# Patient Record
Sex: Male | Born: 1950
Health system: Southern US, Community
[De-identification: ages and names within clinical notes are randomized; demographics above are authoritative.]

## PROBLEM LIST (undated history)

## (undated) DIAGNOSIS — J302 Other seasonal allergic rhinitis: Secondary | ICD-10-CM

## (undated) DIAGNOSIS — R945 Abnormal results of liver function studies: Secondary | ICD-10-CM

## (undated) DIAGNOSIS — E7800 Pure hypercholesterolemia, unspecified: Secondary | ICD-10-CM

## (undated) DIAGNOSIS — K589 Irritable bowel syndrome without diarrhea: Secondary | ICD-10-CM

## (undated) DIAGNOSIS — C61 Malignant neoplasm of prostate: Secondary | ICD-10-CM

## (undated) DIAGNOSIS — I10 Essential (primary) hypertension: Secondary | ICD-10-CM

## (undated) DIAGNOSIS — G540 Brachial plexus disorders: Secondary | ICD-10-CM

## (undated) HISTORY — DX: Abnormal results of liver function studies: R94.5

## (undated) HISTORY — PX: HAND SURGERY: SHX662

## (undated) HISTORY — DX: Essential (primary) hypertension: I10

## (undated) HISTORY — PX: PROSTATECTOMY: SHX69

## (undated) HISTORY — DX: Brachial plexus disorders: G54.0

## (undated) HISTORY — DX: Malignant neoplasm of prostate: C61

## (undated) HISTORY — PX: KNEE SURGERY: SHX244

## (undated) HISTORY — DX: Irritable bowel syndrome, unspecified: K58.9

---

## 1997-06-27 ENCOUNTER — Encounter: Admission: RE | Admit: 1997-06-27 | Discharge: 1997-06-27 | Payer: Self-pay | Admitting: Family Medicine

## 1998-06-16 ENCOUNTER — Encounter: Admission: RE | Admit: 1998-06-16 | Discharge: 1998-06-16 | Payer: Self-pay | Admitting: Sports Medicine

## 1998-06-26 ENCOUNTER — Encounter: Payer: Self-pay | Admitting: Emergency Medicine

## 1998-06-27 ENCOUNTER — Inpatient Hospital Stay (HOSPITAL_COMMUNITY): Admission: EM | Admit: 1998-06-27 | Discharge: 1998-06-28 | Payer: Self-pay | Admitting: Emergency Medicine

## 1999-06-11 ENCOUNTER — Encounter: Admission: RE | Admit: 1999-06-11 | Discharge: 1999-06-11 | Payer: Self-pay | Admitting: Family Medicine

## 1999-07-19 ENCOUNTER — Encounter: Admission: RE | Admit: 1999-07-19 | Discharge: 1999-07-19 | Payer: Self-pay | Admitting: Family Medicine

## 2000-04-03 ENCOUNTER — Encounter: Payer: Self-pay | Admitting: Family Medicine

## 2000-04-21 ENCOUNTER — Encounter: Admission: RE | Admit: 2000-04-21 | Discharge: 2000-04-21 | Payer: Self-pay | Admitting: Family Medicine

## 2000-08-12 ENCOUNTER — Encounter: Admission: RE | Admit: 2000-08-12 | Discharge: 2000-08-12 | Payer: Self-pay | Admitting: Sports Medicine

## 2001-06-19 ENCOUNTER — Encounter: Admission: RE | Admit: 2001-06-19 | Discharge: 2001-06-19 | Payer: Self-pay | Admitting: Family Medicine

## 2001-07-20 ENCOUNTER — Encounter: Admission: RE | Admit: 2001-07-20 | Discharge: 2001-07-20 | Payer: Self-pay | Admitting: Family Medicine

## 2001-07-21 DIAGNOSIS — R7989 Other specified abnormal findings of blood chemistry: Secondary | ICD-10-CM

## 2001-07-21 HISTORY — DX: Other specified abnormal findings of blood chemistry: R79.89

## 2002-02-04 ENCOUNTER — Encounter: Admission: RE | Admit: 2002-02-04 | Discharge: 2002-02-04 | Payer: Self-pay | Admitting: Sports Medicine

## 2002-04-28 ENCOUNTER — Encounter: Admission: RE | Admit: 2002-04-28 | Discharge: 2002-04-28 | Payer: Self-pay | Admitting: Sports Medicine

## 2002-04-28 ENCOUNTER — Encounter: Payer: Self-pay | Admitting: Sports Medicine

## 2002-05-05 ENCOUNTER — Encounter: Admission: RE | Admit: 2002-05-05 | Discharge: 2002-05-05 | Payer: Self-pay | Admitting: Family Medicine

## 2002-05-11 ENCOUNTER — Encounter: Admission: RE | Admit: 2002-05-11 | Discharge: 2002-05-11 | Payer: Self-pay | Admitting: Family Medicine

## 2002-05-18 ENCOUNTER — Encounter: Admission: RE | Admit: 2002-05-18 | Discharge: 2002-06-21 | Payer: Self-pay | Admitting: Sports Medicine

## 2002-06-15 ENCOUNTER — Encounter: Admission: RE | Admit: 2002-06-15 | Discharge: 2002-06-15 | Payer: Self-pay | Admitting: Family Medicine

## 2002-08-25 ENCOUNTER — Encounter: Payer: Self-pay | Admitting: Sports Medicine

## 2002-08-25 ENCOUNTER — Encounter: Admission: RE | Admit: 2002-08-25 | Discharge: 2002-08-25 | Payer: Self-pay | Admitting: Sports Medicine

## 2002-09-08 ENCOUNTER — Encounter: Admission: RE | Admit: 2002-09-08 | Discharge: 2002-09-08 | Payer: Self-pay | Admitting: Sports Medicine

## 2002-11-09 ENCOUNTER — Encounter: Admission: RE | Admit: 2002-11-09 | Discharge: 2002-11-09 | Payer: Self-pay | Admitting: Sports Medicine

## 2002-12-15 ENCOUNTER — Encounter: Admission: RE | Admit: 2002-12-15 | Discharge: 2002-12-15 | Payer: Self-pay | Admitting: Family Medicine

## 2003-02-08 ENCOUNTER — Encounter: Admission: RE | Admit: 2003-02-08 | Discharge: 2003-02-08 | Payer: Self-pay | Admitting: Family Medicine

## 2003-03-02 ENCOUNTER — Encounter: Admission: RE | Admit: 2003-03-02 | Discharge: 2003-03-02 | Payer: Self-pay | Admitting: Family Medicine

## 2003-06-27 ENCOUNTER — Encounter: Admission: RE | Admit: 2003-06-27 | Discharge: 2003-06-27 | Payer: Self-pay | Admitting: Family Medicine

## 2003-07-04 ENCOUNTER — Emergency Department (HOSPITAL_COMMUNITY): Admission: EM | Admit: 2003-07-04 | Discharge: 2003-07-04 | Payer: Self-pay | Admitting: Family Medicine

## 2003-07-21 ENCOUNTER — Encounter: Admission: RE | Admit: 2003-07-21 | Discharge: 2003-07-21 | Payer: Self-pay | Admitting: Family Medicine

## 2003-09-20 ENCOUNTER — Encounter: Admission: RE | Admit: 2003-09-20 | Discharge: 2003-09-20 | Payer: Self-pay | Admitting: Family Medicine

## 2003-12-08 ENCOUNTER — Ambulatory Visit: Payer: Self-pay | Admitting: Sports Medicine

## 2003-12-22 ENCOUNTER — Ambulatory Visit: Payer: Self-pay | Admitting: Sports Medicine

## 2004-02-26 ENCOUNTER — Emergency Department (HOSPITAL_COMMUNITY): Admission: EM | Admit: 2004-02-26 | Discharge: 2004-02-26 | Payer: Self-pay | Admitting: Emergency Medicine

## 2004-03-22 ENCOUNTER — Ambulatory Visit: Payer: Self-pay | Admitting: Family Medicine

## 2004-06-06 ENCOUNTER — Emergency Department (HOSPITAL_COMMUNITY): Admission: EM | Admit: 2004-06-06 | Discharge: 2004-06-06 | Payer: Self-pay | Admitting: Emergency Medicine

## 2004-06-07 ENCOUNTER — Ambulatory Visit: Payer: Self-pay | Admitting: Family Medicine

## 2004-06-30 ENCOUNTER — Emergency Department (HOSPITAL_COMMUNITY): Admission: EM | Admit: 2004-06-30 | Discharge: 2004-06-30 | Payer: Self-pay | Admitting: Emergency Medicine

## 2004-07-09 ENCOUNTER — Ambulatory Visit: Payer: Self-pay | Admitting: Family Medicine

## 2004-07-12 ENCOUNTER — Ambulatory Visit: Payer: Self-pay | Admitting: Sports Medicine

## 2004-11-16 ENCOUNTER — Ambulatory Visit: Payer: Self-pay | Admitting: Sports Medicine

## 2004-11-22 ENCOUNTER — Encounter: Admission: RE | Admit: 2004-11-22 | Discharge: 2004-11-22 | Payer: Self-pay | Admitting: Sports Medicine

## 2005-06-13 ENCOUNTER — Ambulatory Visit: Payer: Self-pay | Admitting: Sports Medicine

## 2005-09-08 ENCOUNTER — Emergency Department (HOSPITAL_COMMUNITY): Admission: EM | Admit: 2005-09-08 | Discharge: 2005-09-08 | Payer: Self-pay | Admitting: Emergency Medicine

## 2005-09-11 ENCOUNTER — Encounter (HOSPITAL_COMMUNITY): Admission: RE | Admit: 2005-09-11 | Discharge: 2005-12-10 | Payer: Self-pay | Admitting: Emergency Medicine

## 2005-09-12 ENCOUNTER — Ambulatory Visit: Payer: Self-pay | Admitting: Sports Medicine

## 2005-09-13 ENCOUNTER — Ambulatory Visit: Payer: Self-pay | Admitting: Sports Medicine

## 2005-11-18 ENCOUNTER — Ambulatory Visit: Payer: Self-pay | Admitting: Sports Medicine

## 2006-03-20 DIAGNOSIS — G44209 Tension-type headache, unspecified, not intractable: Secondary | ICD-10-CM

## 2006-03-20 DIAGNOSIS — J45990 Exercise induced bronchospasm: Secondary | ICD-10-CM | POA: Insufficient documentation

## 2006-03-20 DIAGNOSIS — N411 Chronic prostatitis: Secondary | ICD-10-CM | POA: Insufficient documentation

## 2006-03-20 DIAGNOSIS — E785 Hyperlipidemia, unspecified: Secondary | ICD-10-CM

## 2006-03-25 ENCOUNTER — Observation Stay (HOSPITAL_COMMUNITY): Admission: EM | Admit: 2006-03-25 | Discharge: 2006-03-26 | Payer: Self-pay | Admitting: Family Medicine

## 2006-03-25 ENCOUNTER — Ambulatory Visit: Payer: Self-pay | Admitting: Internal Medicine

## 2006-03-28 ENCOUNTER — Telehealth: Payer: Self-pay | Admitting: *Deleted

## 2006-04-04 ENCOUNTER — Telehealth: Payer: Self-pay | Admitting: Sports Medicine

## 2006-04-04 ENCOUNTER — Encounter: Payer: Self-pay | Admitting: Sports Medicine

## 2006-04-07 ENCOUNTER — Encounter: Payer: Self-pay | Admitting: Sports Medicine

## 2006-05-22 ENCOUNTER — Ambulatory Visit: Payer: Self-pay | Admitting: Sports Medicine

## 2006-05-22 DIAGNOSIS — M25469 Effusion, unspecified knee: Secondary | ICD-10-CM | POA: Insufficient documentation

## 2006-05-22 DIAGNOSIS — M25569 Pain in unspecified knee: Secondary | ICD-10-CM | POA: Insufficient documentation

## 2006-05-23 ENCOUNTER — Telehealth: Payer: Self-pay | Admitting: Sports Medicine

## 2006-05-27 ENCOUNTER — Ambulatory Visit: Payer: Self-pay | Admitting: Sports Medicine

## 2006-05-27 DIAGNOSIS — IMO0002 Reserved for concepts with insufficient information to code with codable children: Secondary | ICD-10-CM | POA: Insufficient documentation

## 2006-05-28 ENCOUNTER — Encounter: Payer: Self-pay | Admitting: Sports Medicine

## 2006-07-01 ENCOUNTER — Ambulatory Visit (HOSPITAL_BASED_OUTPATIENT_CLINIC_OR_DEPARTMENT_OTHER): Admission: RE | Admit: 2006-07-01 | Discharge: 2006-07-01 | Payer: Self-pay | Admitting: Orthopedic Surgery

## 2006-07-04 ENCOUNTER — Encounter: Payer: Self-pay | Admitting: Sports Medicine

## 2006-10-09 ENCOUNTER — Ambulatory Visit: Payer: Self-pay | Admitting: Sports Medicine

## 2006-10-10 ENCOUNTER — Telehealth: Payer: Self-pay | Admitting: Sports Medicine

## 2006-11-12 ENCOUNTER — Ambulatory Visit: Payer: Self-pay | Admitting: Family Medicine

## 2007-01-20 ENCOUNTER — Encounter: Payer: Self-pay | Admitting: Sports Medicine

## 2007-03-11 ENCOUNTER — Telehealth: Payer: Self-pay | Admitting: *Deleted

## 2007-03-11 ENCOUNTER — Encounter (INDEPENDENT_AMBULATORY_CARE_PROVIDER_SITE_OTHER): Payer: Self-pay | Admitting: *Deleted

## 2007-03-11 ENCOUNTER — Encounter: Admission: RE | Admit: 2007-03-11 | Discharge: 2007-03-11 | Payer: Self-pay | Admitting: *Deleted

## 2007-03-11 ENCOUNTER — Ambulatory Visit: Payer: Self-pay | Admitting: Family Medicine

## 2007-03-25 ENCOUNTER — Ambulatory Visit: Payer: Self-pay | Admitting: Sports Medicine

## 2007-03-25 DIAGNOSIS — S336XXA Sprain of sacroiliac joint, initial encounter: Secondary | ICD-10-CM | POA: Insufficient documentation

## 2007-03-29 ENCOUNTER — Encounter: Admission: RE | Admit: 2007-03-29 | Discharge: 2007-03-29 | Payer: Self-pay | Admitting: Family Medicine

## 2007-04-01 ENCOUNTER — Encounter (INDEPENDENT_AMBULATORY_CARE_PROVIDER_SITE_OTHER): Payer: Self-pay | Admitting: *Deleted

## 2007-04-01 ENCOUNTER — Telehealth (INDEPENDENT_AMBULATORY_CARE_PROVIDER_SITE_OTHER): Payer: Self-pay | Admitting: *Deleted

## 2007-04-02 ENCOUNTER — Telehealth (INDEPENDENT_AMBULATORY_CARE_PROVIDER_SITE_OTHER): Payer: Self-pay | Admitting: *Deleted

## 2007-04-09 ENCOUNTER — Encounter: Payer: Self-pay | Admitting: Sports Medicine

## 2007-04-23 ENCOUNTER — Telehealth: Payer: Self-pay | Admitting: *Deleted

## 2007-04-23 ENCOUNTER — Ambulatory Visit: Payer: Self-pay | Admitting: Sports Medicine

## 2007-04-23 DIAGNOSIS — E785 Hyperlipidemia, unspecified: Secondary | ICD-10-CM | POA: Insufficient documentation

## 2007-04-23 DIAGNOSIS — M4310 Spondylolisthesis, site unspecified: Secondary | ICD-10-CM

## 2007-04-27 ENCOUNTER — Encounter: Admission: RE | Admit: 2007-04-27 | Discharge: 2007-04-27 | Payer: Self-pay | Admitting: Sports Medicine

## 2007-04-27 LAB — CONVERTED CEMR LAB
ALT: 29 units/L (ref 0–53)
Albumin: 4.5 g/dL (ref 3.5–5.2)
CO2: 23 meq/L (ref 19–32)
Calcium: 9.5 mg/dL (ref 8.4–10.5)
Cholesterol: 173 mg/dL (ref 0–200)
Creatinine, Ser: 0.89 mg/dL (ref 0.40–1.50)
Glucose, Bld: 91 mg/dL (ref 70–99)
HCT: 43 % (ref 39.0–52.0)
Hemoglobin: 14.8 g/dL (ref 13.0–17.0)
LDL Cholesterol: 106 mg/dL — ABNORMAL HIGH (ref 0–99)
MCV: 93.5 fL (ref 78.0–100.0)
PSA: 1.86 ng/mL (ref 0.10–4.00)
Platelets: 247 10*3/uL (ref 150–400)
Total CHOL/HDL Ratio: 3.5
Total Protein: 7 g/dL (ref 6.0–8.3)
VLDL: 18 mg/dL (ref 0–40)

## 2007-04-30 ENCOUNTER — Ambulatory Visit: Payer: Self-pay | Admitting: Sports Medicine

## 2007-04-30 DIAGNOSIS — L570 Actinic keratosis: Secondary | ICD-10-CM | POA: Insufficient documentation

## 2007-06-29 ENCOUNTER — Ambulatory Visit (HOSPITAL_BASED_OUTPATIENT_CLINIC_OR_DEPARTMENT_OTHER): Admission: RE | Admit: 2007-06-29 | Discharge: 2007-06-29 | Payer: Self-pay | Admitting: Orthopedic Surgery

## 2007-09-01 ENCOUNTER — Ambulatory Visit: Payer: Self-pay | Admitting: Sports Medicine

## 2007-09-01 DIAGNOSIS — L84 Corns and callosities: Secondary | ICD-10-CM

## 2007-10-14 ENCOUNTER — Ambulatory Visit: Payer: Self-pay | Admitting: Family Medicine

## 2007-12-29 ENCOUNTER — Ambulatory Visit: Payer: Self-pay | Admitting: Family Medicine

## 2008-01-04 ENCOUNTER — Encounter: Payer: Self-pay | Admitting: Family Medicine

## 2008-01-28 ENCOUNTER — Ambulatory Visit: Payer: Self-pay | Admitting: Family Medicine

## 2008-01-28 ENCOUNTER — Telehealth: Payer: Self-pay | Admitting: *Deleted

## 2008-05-31 ENCOUNTER — Ambulatory Visit: Payer: Self-pay | Admitting: Family Medicine

## 2008-05-31 DIAGNOSIS — R197 Diarrhea, unspecified: Secondary | ICD-10-CM

## 2008-05-31 DIAGNOSIS — R079 Chest pain, unspecified: Secondary | ICD-10-CM

## 2008-06-01 ENCOUNTER — Encounter: Payer: Self-pay | Admitting: Family Medicine

## 2008-06-01 LAB — CONVERTED CEMR LAB
LDL Cholesterol: 178 mg/dL — ABNORMAL HIGH (ref 0–99)
VLDL: 15 mg/dL (ref 0–40)

## 2008-06-06 ENCOUNTER — Telehealth: Payer: Self-pay | Admitting: Family Medicine

## 2008-06-06 ENCOUNTER — Encounter: Payer: Self-pay | Admitting: Family Medicine

## 2008-06-21 ENCOUNTER — Ambulatory Visit: Payer: Self-pay | Admitting: Sports Medicine

## 2008-06-21 DIAGNOSIS — M25559 Pain in unspecified hip: Secondary | ICD-10-CM

## 2008-11-10 ENCOUNTER — Encounter: Payer: Self-pay | Admitting: Urology

## 2008-11-10 ENCOUNTER — Encounter: Payer: Self-pay | Admitting: Family Medicine

## 2008-11-10 ENCOUNTER — Ambulatory Visit (HOSPITAL_BASED_OUTPATIENT_CLINIC_OR_DEPARTMENT_OTHER): Admission: RE | Admit: 2008-11-10 | Discharge: 2008-11-10 | Payer: Self-pay | Admitting: Urology

## 2008-11-21 ENCOUNTER — Telehealth: Payer: Self-pay | Admitting: *Deleted

## 2008-11-23 ENCOUNTER — Ambulatory Visit: Payer: Self-pay | Admitting: Family Medicine

## 2008-11-23 ENCOUNTER — Telehealth: Payer: Self-pay | Admitting: Family Medicine

## 2008-11-23 ENCOUNTER — Ambulatory Visit: Admission: RE | Admit: 2008-11-23 | Discharge: 2008-12-26 | Payer: Self-pay | Admitting: Radiation Oncology

## 2008-11-23 DIAGNOSIS — C61 Malignant neoplasm of prostate: Secondary | ICD-10-CM

## 2008-11-24 ENCOUNTER — Encounter: Payer: Self-pay | Admitting: Family Medicine

## 2008-11-28 ENCOUNTER — Encounter: Payer: Self-pay | Admitting: Family Medicine

## 2008-11-28 ENCOUNTER — Telehealth: Payer: Self-pay | Admitting: *Deleted

## 2008-11-28 ENCOUNTER — Ambulatory Visit: Payer: Self-pay | Admitting: Family Medicine

## 2008-11-28 ENCOUNTER — Ambulatory Visit (HOSPITAL_COMMUNITY): Admission: RE | Admit: 2008-11-28 | Discharge: 2008-11-28 | Payer: Self-pay | Admitting: Family Medicine

## 2008-11-28 LAB — CONVERTED CEMR LAB
BUN: 15 mg/dL (ref 6–23)
Calcium: 9.4 mg/dL (ref 8.4–10.5)
Chloride: 105 meq/L (ref 96–112)
Creatinine, Ser: 1.11 mg/dL (ref 0.40–1.50)
Potassium: 4.1 meq/L (ref 3.5–5.3)

## 2008-11-29 ENCOUNTER — Ambulatory Visit: Payer: Self-pay | Admitting: Family Medicine

## 2008-11-30 ENCOUNTER — Ambulatory Visit (HOSPITAL_COMMUNITY): Admission: RE | Admit: 2008-11-30 | Discharge: 2008-11-30 | Payer: Self-pay | Admitting: Family Medicine

## 2008-12-12 ENCOUNTER — Ambulatory Visit: Payer: Self-pay | Admitting: Family Medicine

## 2009-01-02 ENCOUNTER — Inpatient Hospital Stay (HOSPITAL_COMMUNITY): Admission: RE | Admit: 2009-01-02 | Discharge: 2009-01-03 | Payer: Self-pay | Admitting: Urology

## 2009-01-02 ENCOUNTER — Encounter (INDEPENDENT_AMBULATORY_CARE_PROVIDER_SITE_OTHER): Payer: Self-pay | Admitting: Urology

## 2009-01-09 ENCOUNTER — Telehealth: Payer: Self-pay | Admitting: Family Medicine

## 2009-01-10 ENCOUNTER — Encounter: Payer: Self-pay | Admitting: Sports Medicine

## 2009-02-22 ENCOUNTER — Encounter: Payer: Self-pay | Admitting: Sports Medicine

## 2009-03-29 ENCOUNTER — Telehealth: Payer: Self-pay | Admitting: Family Medicine

## 2009-03-29 ENCOUNTER — Ambulatory Visit: Payer: Self-pay | Admitting: Family Medicine

## 2009-03-29 ENCOUNTER — Encounter: Payer: Self-pay | Admitting: Family Medicine

## 2009-03-29 DIAGNOSIS — R300 Dysuria: Secondary | ICD-10-CM | POA: Insufficient documentation

## 2009-03-29 LAB — CONVERTED CEMR LAB
Bilirubin Urine: NEGATIVE
Ketones, urine, test strip: NEGATIVE
pH: 6

## 2009-04-14 ENCOUNTER — Ambulatory Visit: Payer: Self-pay | Admitting: Family Medicine

## 2009-04-14 LAB — CONVERTED CEMR LAB
Glucose, Urine, Semiquant: NEGATIVE
Ketones, urine, test strip: NEGATIVE
Specific Gravity, Urine: 1.015

## 2009-04-18 ENCOUNTER — Telehealth (INDEPENDENT_AMBULATORY_CARE_PROVIDER_SITE_OTHER): Payer: Self-pay | Admitting: *Deleted

## 2009-04-19 ENCOUNTER — Ambulatory Visit: Payer: Self-pay | Admitting: Family Medicine

## 2009-04-19 LAB — CONVERTED CEMR LAB
Bilirubin Urine: NEGATIVE
Glucose, Urine, Semiquant: NEGATIVE
Protein, U semiquant: NEGATIVE
Urobilinogen, UA: 0.2
WBC Urine, dipstick: NEGATIVE
pH: 5.5

## 2009-04-20 ENCOUNTER — Encounter: Payer: Self-pay | Admitting: Family Medicine

## 2009-05-15 ENCOUNTER — Ambulatory Visit: Payer: Self-pay | Admitting: Family Medicine

## 2009-05-15 DIAGNOSIS — H9209 Otalgia, unspecified ear: Secondary | ICD-10-CM | POA: Insufficient documentation

## 2009-07-05 ENCOUNTER — Encounter: Payer: Self-pay | Admitting: Family Medicine

## 2009-08-07 ENCOUNTER — Ambulatory Visit: Payer: Self-pay | Admitting: Family Medicine

## 2009-08-07 ENCOUNTER — Telehealth: Payer: Self-pay | Admitting: Family Medicine

## 2009-09-22 ENCOUNTER — Ambulatory Visit: Payer: Self-pay | Admitting: Family Medicine

## 2009-09-22 DIAGNOSIS — L2089 Other atopic dermatitis: Secondary | ICD-10-CM

## 2009-09-22 DIAGNOSIS — B356 Tinea cruris: Secondary | ICD-10-CM

## 2009-09-22 LAB — CONVERTED CEMR LAB
ALT: 28 units/L (ref 0–53)
AST: 63 units/L — ABNORMAL HIGH (ref 0–37)
Albumin: 4.2 g/dL (ref 3.5–5.2)
CO2: 28 meq/L (ref 19–32)
Calcium: 9.7 mg/dL (ref 8.4–10.5)
Chloride: 102 meq/L (ref 96–112)
Total Protein: 6.8 g/dL (ref 6.0–8.3)

## 2009-09-27 ENCOUNTER — Telehealth: Payer: Self-pay | Admitting: Family Medicine

## 2009-11-02 ENCOUNTER — Ambulatory Visit: Payer: Self-pay | Admitting: Family Medicine

## 2009-12-22 ENCOUNTER — Encounter (INDEPENDENT_AMBULATORY_CARE_PROVIDER_SITE_OTHER): Payer: Self-pay | Admitting: *Deleted

## 2009-12-22 ENCOUNTER — Ambulatory Visit: Payer: Self-pay | Admitting: Family Medicine

## 2009-12-25 ENCOUNTER — Telehealth: Payer: Self-pay | Admitting: Family Medicine

## 2009-12-26 LAB — CONVERTED CEMR LAB
BUN: 18 mg/dL (ref 6–23)
Calcium: 9.2 mg/dL (ref 8.4–10.5)
Direct LDL: 123 mg/dL — ABNORMAL HIGH
Glucose, Bld: 91 mg/dL (ref 70–99)
Sodium: 139 meq/L (ref 135–145)
Total Protein: 7 g/dL (ref 6.0–8.3)

## 2010-01-05 ENCOUNTER — Encounter: Payer: Self-pay | Admitting: Sports Medicine

## 2010-01-15 ENCOUNTER — Encounter: Payer: Self-pay | Admitting: Family Medicine

## 2010-01-15 ENCOUNTER — Observation Stay (HOSPITAL_COMMUNITY)
Admission: EM | Admit: 2010-01-15 | Discharge: 2010-01-15 | Payer: Self-pay | Source: Home / Self Care | Attending: Family Medicine | Admitting: Family Medicine

## 2010-01-24 ENCOUNTER — Ambulatory Visit
Admission: RE | Admit: 2010-01-24 | Discharge: 2010-01-24 | Payer: Self-pay | Source: Home / Self Care | Attending: Family Medicine | Admitting: Family Medicine

## 2010-01-24 LAB — CONVERTED CEMR LAB: H Pylori IgG: NEGATIVE

## 2010-02-15 ENCOUNTER — Ambulatory Visit
Admission: RE | Admit: 2010-02-15 | Discharge: 2010-02-15 | Payer: Self-pay | Source: Home / Self Care | Attending: Family Medicine | Admitting: Family Medicine

## 2010-02-15 LAB — CONVERTED CEMR LAB
Bilirubin Urine: NEGATIVE
Glucose, Urine, Semiquant: NEGATIVE
Specific Gravity, Urine: 1.025
Urobilinogen, UA: 0.2
pH: 5.5

## 2010-02-20 NOTE — Progress Notes (Signed)
Summary: Rx Prob  Phone Note Call from Patient Call back at Home Phone [phone]   Caller: Patient Summary of Call: Pt says that he got a child's dosage of the z-pac like a powder to go in liquid.  Was this correct or was the wrong thing filled? Initial call taken by: Clydell Hakim,  August 07, 2009 1:46 PM  Follow-up for Phone Call        mistake in electronic prescribing, called and corrected Follow-up by: Luretha Murphy NP,  August 07, 2009 4:25 PM

## 2010-02-20 NOTE — Progress Notes (Signed)
  Phone Note Outgoing Call   Call placed by: Paula Compton MD,  September 27, 2009 8:36 AM Call placed to: Patient Action Taken: Phone Call Completed Summary of Call: Called and discussed lab results. To resume statin in light of LDL 169.  Continue with dietary and activity as he has been doing.  Skin on face and neck is better; groin tinea cruris is taking a little longer.  Discussed very minor elevation in single transaminase.  Will recheck iwth future labs.  Initial call taken by: Paula Compton MD,  September 27, 2009 8:37 AM

## 2010-02-20 NOTE — Progress Notes (Signed)
Summary: triage  Phone Note Call from Patient Call back at [phone]   Caller: Patient Summary of Call: Thinks he has a urinary tract infection and wondering if he can be seen today. Initial call taken by: Clydell Hakim,  March 29, 2009 9:59 AM  Follow-up for Phone Call        frequent painful urination. blood in underwear. started last night. states he had a prostatectomy for cancer recently but is not due back at that md until summer.. work in at 1:30. aware of wait Follow-up by: Golden Circle RN,  March 29, 2009 10:03 AM

## 2010-02-20 NOTE — Assessment & Plan Note (Signed)
Summary: flu shot,df  Nurse Visit   Vital Signs:  Patient profile:   60 year old male Temp:     98.4 degrees F  Vitals Entered By: Theresia Lo RN (November 02, 2009 4:12 PM)  Allergies: 1)  ! * Cialis  Immunizations Administered:  Influenza Vaccine # 1:    Vaccine Type: Fluvax 3+    Site: right deltoid    Mfr: GlaxoSmithKline    Dose: 0.5 ml    Route: IM    Given by: Theresia Lo RN    Exp. Date: 07/18/2010    Lot #: [id number]    VIS given: 08/15/09 version given November 02, 2009.  Flu Vaccine Consent Questions:    Do you have a history of severe allergic reactions to this vaccine? no    Any prior history of allergic reactions to egg and/or gelatin? no    Do you have a sensitivity to the preservative Thimersol? no    Do you have a past history of Guillan-Barre Syndrome? no    Do you currently have an acute febrile illness? no    Have you ever had a severe reaction to latex? no    Vaccine information given and explained to patient? yes  Orders Added: 1)  Flu Vaccine 55yrs + [90658] 2)  Admin 1st Vaccine [37902]

## 2010-02-20 NOTE — Assessment & Plan Note (Signed)
Summary: uti per pt/see notes/[place]   Vital Signs:  Patient profile:   60 year old male Height:      70.5 inches Weight:      163 pounds BMI:     23.14 Temp:     98.2 degrees F oral Pulse rate:   82 / minute BP sitting:   154 / 84  (left arm) Cuff size:   regular  Vitals Entered By: Tessie Fass CMA (March 29, 2009 1:42 PM) CC: dysuria, hematuria, and polyuria Is Patient Diabetic? No Pain Assessment Patient in pain? yes     Location: abdomen Intensity: 5   Primary Care Provider:  Paula Compton MD  CC:  dysuria, hematuria, and and polyuria.  History of Present Illness: 60 yo with history of robotic prostatectomy in December for prostate cancer here for 1 day history of urinary frequency, sensation of incomplete voiding, hematuria and pelvic fullness.  Denies fever, pain, nausea/vomting.    He is not currently undergoing any treatment from urology except for Erectile dysfunction.  Allergies: No Known Drug Allergies PMH-FH-SH reviewed-no changes except otherwise noted  Review of Systems      See HPI General:  Denies chills, fever, and sweats. GU:  Complains of dysuria, erectile dysfunction, hematuria, and urinary frequency.  Physical Exam  General:  well appearing, no apparent distress Abdomen:  no abd pain, no flank pain.  minimal discomfort on palpation of lower pelvis with no bladder fullness.   Impression & Recommendations:  Problem # 1:  DYSURIA (ICD-788.1) First time urinary tract infection.  Prostate removed with hx of prostate cancer so no concern for prostatitis. Given fairly recent instrumentation, will prescribe macrobid for 10 days to cover enterococcuc and E. Coli.  Will culture urine and follow-up with sensetivities.  Precepted with Dr. McDiarmid.  His updated medication list for this problem includes:    Macrobid 100 Mg Caps (Nitrofurantoin monohyd macro) .[name]... Take one tablet twice a day for 10 days.  Orders: Urinalysis-[facility] (00000) Urine  Culture-[facility] ([address]454) [facility]- Est Level  3 (09811)  Complete Medication List: 1)  Zocor 40 Mg Tabs (Simvastatin) .[name].. 1 by mouth qhs 2)  Singulair 10 Mg Tabs (Montelukast sodium) .[name].. 1 by mouth qhs 3)  Albuterol 90 Mcg/act Aers (Albuterol) .... Use 2 puffs q 6h prn 4)  Macrobid 100 Mg Caps (Nitrofurantoin monohyd macro) .... Take one tablet twice a day for 10 days.  Patient Instructions: 1)  you have a urinary tract infection. 2)  I have called in antibiotic- macrobid for you. 3)  Will sen doff your urine for culture- will call you to change antibiotics if we get new information. 4)  Take with food or milk 5)  May cause yellow/brown urine. 6)  Follow-up if no improvement in symptoms. Prescriptions: MACROBID 100 MG CAPS (NITROFURANTOIN MONOHYD MACRO) take one tablet twice a day for 10 days.  #20 x 0   Entered and Authorized by:   Delbert Harness MD   Signed by:   Delbert Harness MD on 03/29/2009   Method used:   Electronically to        [facility] [phone][facility] (retail)       [address]       [place], [place]  [address]       Ph: [phone] or [phone]       Fax: [phone]   RxID:   [id number]   Laboratory Results   Urine Tests  Date/Time Received: March 29, 2009 2:13 PM  Date/Time Reported: March 29, 2009 2:32 PM   Routine Urinalysis   Color: yellow Appearance: Clear Glucose: negative   (Normal Range: Negative) Bilirubin: negative   (Normal Range: Negative) Ketone: negative   (Normal Range: Negative) Spec. Gravity: <1.005   (Normal Range: 1.003-1.035) Blood: large   (Normal Range: Negative) pH: 6.0   (Normal Range: 5.0-8.0) Protein: negative   (Normal Range: Negative) Urobilinogen: 0.2   (Normal Range: 0-1) Nitrite: negative   (Normal Range: Negative) Leukocyte Esterace: large   (Normal Range: Negative)  Urine Microscopic WBC/HPF: 10-20 RBC/HPF: 1-5 Bacteria/HPF: 1+ Epithelial/HPF: rare    Comments: ...........test performed by...........Marland KitchenTerese Door,  CMA

## 2010-02-20 NOTE — Assessment & Plan Note (Signed)
Summary: sinus issues,tcb   Vital Signs:  Patient profile:   60 year old male Weight:      152 pounds Temp:     98.4 degrees F oral BP sitting:   110 / 72  (left arm) Cuff size:   regular  Vitals Entered By: Arlyss Repress CMA, (August 07, 2009 9:19 AM) CC: sinus problems x 10 days. check mole/spot on right lower leg Is Patient Diabetic? No Pain Assessment Patient in pain? yes     Location: face Intensity: 4 Onset of pain  x 10 days.   Primary Care Provider:  Paula Compton MD  CC:  sinus problems x 10 days. check mole/spot on right lower leg.  History of Present Illness: Sinus pain for 10 days, predominately right sided.  Seemed to flair after moving the grass.  Pain is headace, ear, and upper teeth.  Has been using a Sales executive to irrigate.    Has 2 small areas that popped up on his right lower leg that he wants to be checked.  No history of skin cancers. + history of prostate cancer and S/P prostatectomy and radiation.  Habits & Providers  Alcohol-Tobacco-Diet     Tobacco Status: never  Current Medications (verified): 1)  Zocor 40 Mg  Tabs (Simvastatin) .[name].. 1 By Mouth Qhs 2)  Levitra 10 Mg Tabs (Vardenafil Hcl) .... Take As Directed 3)  Proair Hfa 108 (90 Base) Mcg/act Aers (Albuterol Sulfate) .... Sig: Take 2 Puffs Every 4 To 6 Hours As Needed For Wheezing Disp 1 Hfa 4)  Singulair 10 Mg Tabs (Montelukast Sodium) .[name].. 1 By Mouth Daily 5)  Qvar 80 Mcg/act Aers (Beclomethasone Dipropionate) .[name].. 1 Puff Two Times A Day For Asthma 6)  Zithromax 1 Gm Pack (Azithromycin) .... As Directed 7)  Flonase 50 Mcg/act Susp (Fluticasone Propionate) .... 2 Squirts With in Each Nostril Daily  Allergies (verified): 1)  ! * Cialis  Review of Systems General:  Complains of malaise; denies chills and fever. ENT:  Complains of earache, nasal congestion, postnasal drainage, and sinus pressure; denies sore throat. Resp:  Denies cough and wheezing.  Physical Exam  General:  Well  appearing Head:  + pain with palpation of frontal and maxillary sinuses on the right Ears:  Right TM retracted and injected, Left TM retractedd Nose:  sore on septum Mouth:  post nasal drainage Neck:  No deformities, masses, or tenderness noted. Lungs:  normal respiratory effort and normal breath sounds.   Heart:  normal rate and regular rhythm.   Skin:  2 very small less than 5 mm lesions on lower right leg   Impression & Recommendations:  Problem # 1:  SINUSITIS, ACUTE (ICD-461.9)  No way to tell if infectious or inflammatory; will treat for both.  Discussed with patient and he preferred this approach. His updated medication list for this problem includes:    Zithromax 1 Gm Pack (Azithromycin) .[name]... As directed    Flonase 50 Mcg/act Susp (Fluticasone propionate) .[name]... 2 squirts with in each nostril daily  Orders: [facility]- Est  Level 4 (99214)  Problem # 2:  SKIN LESION (ICD-709.9)  2 very small (less than 5 cm) red, slightly raised lesions, likely blood vessel in orgin  Orders: [facility]- Est  Level 4 (16109)  Problem # 3:  ASTHMA, EXERCISE INDUCED (ICD-493.81) Stable, not flairing with sinus problems His updated medication list for this problem includes:    Proair Hfa 108 (90 Base) Mcg/act Aers (Albuterol sulfate) ..... Sig: take 2 puffs  every 4 to 6 hours as needed for wheezing disp 1 hfa    Singulair 10 Mg Tabs (Montelukast sodium) .[name]... 1 by mouth daily    Qvar 80 Mcg/act Aers (Beclomethasone dipropionate) .[name]... 1 puff two times a day for asthma  Orders: [facility]- Est  Level 4 (55732)  Complete Medication List: 1)  Zocor 40 Mg Tabs (Simvastatin) .[name].. 1 by mouth qhs 2)  Levitra 10 Mg Tabs (Vardenafil hcl) .... Take as directed 3)  Proair Hfa 108 (90 Base) Mcg/act Aers (Albuterol sulfate) .... Sig: take 2 puffs every 4 to 6 hours as needed for wheezing disp 1 hfa 4)  Singulair 10 Mg Tabs (Montelukast sodium) .[name].. 1 by mouth daily 5)  Qvar 80 Mcg/act Aers (Beclomethasone  dipropionate) .[name].. 1 puff two times a day for asthma 6)  Zithromax 1 Gm Pack (Azithromycin) .... As directed 7)  Flonase 50 Mcg/act Susp (Fluticasone propionate) .... 2 squirts with in each nostril daily  Patient Instructions: 1)  Complete antibiotics 2)  Use nasal spray for one month 3)  return prn Prescriptions: FLONASE 50 MCG/ACT SUSP (FLUTICASONE PROPIONATE) 2 squirts with in each nostril daily  #1 x 3   Entered and Authorized by:   Luretha Murphy NP   Signed by:   Luretha Murphy NP on 08/07/2009   Method used:   [facility]ron[facility] to        [facility] [phone][facility] (retail)       [address]       [place], [place]  [address]       Ph: [phone] or [phone]       Fax: [phone]   RxID:   [id number] ZITHROMAX 1 GM PACK (AZITHROMYCIN) as directed  #1 x 0   Entered and Authorized by:   Luretha Murphy NP   Signed by:   Luretha Murphy NP on 08/07/2009   Method used:   [facility] to        [facility] [phone]* (retail)       [address]       [place], [place]  [address]       Ph: [phone] or [phone]       Fax: [phone]   RxID:   [id number]

## 2010-02-20 NOTE — Assessment & Plan Note (Signed)
Summary: allgeries,tcb   Vital Signs:  Patient profile:   60 year old male Weight:      159.7 pounds Pulse rate:   72 / minute BP sitting:   119 / 79  (right arm)  Vitals Entered By: Arlyss Repress CMA, (May 15, 2009 11:02 AM) CC: ? allergies. bilateral ear pain x 10 days. Is Patient Diabetic? No Pain Assessment Patient in pain? yes     Location: ears Intensity: 4 Onset of pain  x 10 days.   Primary Care Provider:  Paula Compton MD  CC:  ? allergies. bilateral ear pain x 10 days.[name]  History of Present Illness: 60 yo male here for Loss of energy, feels short of breath for a few weeks.  Having trouble with his work outs.  Heart rate gets higher and  does not return to baseline like normal, resting heart rate is higher. No chest pain.  Had normal stress test about 3 years ago per pt after hospitalization for chest pain (non cardiac).  Was able to go for long bike ride yesterday without problems.  Main concern is B ear pain, L >R that is sharp and fleeting, located behind ears and down to neck.  Minimal nasal discharge, some congestion.  Pain has been going on about 10 days, occurs several times a day.  Has tried allegra for allergy symptoms a few times (itchy eyes, sneezing).  Pt also with h/o asthma.  Uses rescue inhaler, and has been using before work outs lately, which helps Has used Advair and singulair in past, but stopped because he didn't need them.  Denies fever.  No urinary signs.  Habits & Providers  Alcohol-Tobacco-Diet     Tobacco Status: never  Current Medications (verified): 1)  Zocor 40 Mg  Tabs (Simvastatin) .[name].. 1 By Mouth Qhs 2)  Levitra 10 Mg Tabs (Vardenafil Hcl) .... Take As Directed 3)  Proair Hfa 108 (90 Base) Mcg/act Aers (Albuterol Sulfate) .... Sig: Take 2 Puffs Every 4 To 6 Hours As Needed For Wheezing Disp 1 Hfa  Allergies (verified): 1)  ! 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   Review of Systems       see HPI  Physical Exam  General:  Well-developed,well-nourished,in no acute distress; alert,appropriate and cooperative throughout examination Head:  Normocephalic and atraumatic without obvious abnormalities. No apparent alopecia or balding. Eyes:  No corneal or conjunctival inflammation noted. No discharge Ears:  External ear exam shows no significant lesions or deformities.  Otoscopic examination reveals clear canals, tympanic membranes are intact bilaterally without bulging,inflammation or discharge.  + retraction B without evidence of fluid behind ears. Hearing is grossly normal bilaterally. Nose:  External nasal examination shows no deformity or inflammation. Nasal mucosa are pink and moist without lesions or exudates. Mouth:  Oral mucosa and oropharynx without lesions or exudates.  Teeth in good repair. + mild posterior cobblestoning Neck:  No deformities, masses, or tenderness noted. Lungs:  Normal respiratory effort, chest expands symmetrically. Lungs are clear to auscultation, no crackles or wheezes. Heart:  Normal rate and regular rhythm. S1 and S2 normal without gallop, murmur, click, rub or other extra sounds.   Impression & Recommendations:  Problem # 1:  ASTHMA, EXERCISE INDUCED (ICD-493.81)  Pt with dyspnea with exertion.  May be due to asthma, so will rx with  inhaled steroids and singulair.  If no relief, f/u in 1 week and consider further w/u The following medications were removed from the medication list:    Singulair 10 Mg Tabs (Montelukast sodium) .[name]... 1 by mouth qhs His updated medication list for this problem includes:    Proair Hfa 108 (90 Base) Mcg/act Aers (Albuterol sulfate) ..... Sig: take 2 puffs every 4 to 6 hours as needed for wheezing disp 1 hfa    Singulair 10 Mg Tabs (Montelukast sodium) .[name]... 1 by mouth  daily    Qvar 80 Mcg/act Aers (Beclomethasone dipropionate) .[name]... 1 puff two times a day for asthma  Orders: [facility]- Est Level  3 (16109)  Problem # 2:  EAR PAIN, BILATERAL (ICD-388.70)  Possibly due to sinus congestion from allergies.  Trial of Neti pot or decongestants.    Orders: [facility]- Est Level  3 (60454)  Complete Medication List: 1)  Zocor 40 Mg Tabs (Simvastatin) .[name].. 1 by mouth qhs 2)  Levitra 10 Mg Tabs (Vardenafil hcl) .... Take as directed 3)  Proair Hfa 108 (90 Base) Mcg/act Aers (Albuterol sulfate) .... Sig: take 2 puffs every 4 to 6 hours as needed for wheezing disp 1 hfa 4)  Singulair 10 Mg Tabs (Montelukast sodium) .[name].. 1 by mouth daily 5)  Qvar 80 Mcg/act Aers (Beclomethasone dipropionate) .[name].. 1 puff two times a day for asthma  Patient Instructions: 1)  It was nice to meet you today. 2)  If the medicines do not help in 1 week, please come see [place] again. 3)  If you have chest pain or more trouble breathing, then please call us or go to the ER. 4)  You can try any over the counter decongestant, but pseudophed is a good one. Prescriptions: QVAR 80 MCG/ACT AERS (BECLOMETHASONE DIPROPIONATE) 1 puff two times a day for asthma  #1 x 2   Entered and Authorized by:   Munachimso Rigdon [place] MD   Signed by:   Emmitte Surgeon [place] MD on 05/15/2009   Method used:   [facility]ronically to        [facility] [phone][facility] (retail)       [address]       [place], [place]  [address]       Ph: [phone] or [phone]       Fax: [phone]   RxID:   [id number] SINGULAIR 10 MG TABS (MONTELUKAST SODIUM) 1 by mouth daily  #90 x 0   Entered and Authorized by:   Ike Maragh [place] MD   Signed by:   Karrine Kluttz [place] MD on 05/15/2009   Method used:   Electronically to        [facility] [phone]* (retail)       [address]       [place], [place]  [address]       Ph: [phone] or [phone]       Fax: [phone]   RxID:   [id number]

## 2010-02-20 NOTE — Assessment & Plan Note (Signed)
Summary: skin fungus,df   Primary Care Provider:  Paula Compton MD   History of Present Illness: Dustin Burton comes in today for complaints of skin breakout on central face, chest; separate lesion in inguinal area that is itchy and not responding to OTC clotrimazole.  Has beenworse for past 2 weeks. Bikes a lot, using bike shorts that trap moisture.    Facial rash is similar to episode from 10 to 15 yrs ago.  Used a 1% HC cream (otc) without noticeable relief.  Saw dermatologist 10 or so yrs ago, resolved with steroid cream.    Has history of asthma.    Also of note, has not been using his Simvastatin in over a month and a half.  Has changed his activity and diet for the better, would like to see how his LDL is doing before restarting simvastatin.  Last LDL in the 170s.    Current Medications (verified): 1)  Zocor 40 Mg  Tabs (Simvastatin) .[name].. 1 By Mouth Qhs 2)  Levitra 10 Mg Tabs (Vardenafil Hcl) .... Take As Directed 3)  Proair Hfa 108 (90 Base) Mcg/act Aers (Albuterol Sulfate) .... Sig: Take 2 Puffs Every 4 To 6 Hours As Needed For Wheezing Disp 1 Hfa 4)  Singulair 10 Mg Tabs (Montelukast Sodium) .[name].. 1 By Mouth Daily 5)  Qvar 80 Mcg/act Aers (Beclomethasone Dipropionate) .[name].. 1 Puff Two Times A Day For Asthma 6)  Flonase 50 Mcg/act Susp (Fluticasone Propionate) .... 2 Squirts With in Each Nostril Daily 7)  Hydrocortisone 2.5 % Crea (Hydrocortisone) .... Sig Apply To Affected Area One Time Daily For Up To 2 Weeks Disp 1 Large Tube (60g or Equivalent) 8)  Ketoconazole 2 % Crea (Ketoconazole) .... Sig Apply To Affected Area One Time Daily Disp 1 Large Tube (60g or Equivalent)  Allergies (verified): 1)  ! * Cialis  Physical Exam  General:  well appearing, no apparent distress Eyes:  clear sclerae Mouth:  moist mucus membranes, no lesions.  Neck:  neck supple. No adenopathy Abdomen:  soft, nontender Genitalia:  eryythema around scrotum and inguinal crease.  No maceration.  No inguinal  adenopathy Extremities:  No maceration of skin between toes; palpable dp pulses bilaterally.  Skin:  erythema along borders of nares, slight amount periorally; also patches of erythema along anterior neck, central chest (chest lesions with mild dryness). Nuchal area with flaking and erythema. Scalp clear. No alopecia.    Impression & Recommendations:  Problem # 1:  TINEA CRURIS (ICD-110.3)  Tinea cruris on scrotum, perineal area.  No tinea pedis identified on exam. Not responding to OTC nystatin.  Will order Ketoconazole cream for this; may use on toes if he has recurrence of tinea pedis as well.   Orders: [facility]- Est Level  3 (16109)  Problem # 2:  DERMATITIS, ATOPIC (ICD-691.8)  Redness along central face and anterior neck; also with atopic flaky patch on nucchal area and some erythema withotu scale on the central chest.  Appears atopic; has had similar breakout about 10-15 yrs ago which responded to a topical steroid.  Consider acne rosacea given facial appearance; rest of presentation appears more atopic in nature.  Mild topical steroid for 14 days max to see if improves.  He is instructed not to use continuously for more than 14 days.  To call if not improving. His updated medication list for this problem includes:    Hydrocortisone 2.5 % Crea (Hydrocortisone) ..... Sig apply to affected area one time daily for up to 2 weeks  disp 1 large tube (60g or equivalent)  Orders: [facility]- Est Level  3 ([address])  Problem # 3:  HYPERLIPIDEMIA (ICD-272.4) To recheck LDL direct today.  This effectively is his baseline while off simvastatin.  Will use this value to determine whether he needs to go back on the simvastatin.    His updated medication list for this problem includes:    Zocor 40 Mg Tabs (Simvastatin) .[name]... 1 by mouth qhs  Complete Medication List: 1)  Zocor 40 Mg Tabs (Simvastatin) .[name].. 1 by mouth qhs 2)  Levitra 10 Mg Tabs (Vardenafil hcl) .... Take as directed 3)  Proair Hfa 108 (90  Base) Mcg/act Aers (Albuterol sulfate) .... Sig: take 2 puffs every 4 to 6 hours as needed for wheezing disp 1 hfa 4)  Singulair 10 Mg Tabs (Montelukast sodium) .[name].. 1 by mouth daily 5)  Qvar 80 Mcg/act Aers (Beclomethasone dipropionate) .[name].. 1 puff two times a day for asthma 6)  Flonase 50 Mcg/act Susp (Fluticasone propionate) .... 2 squirts with in each nostril daily 7)  Hydrocortisone 2.5 % Crea (Hydrocortisone) .... Sig apply to affected area one time daily for up to 2 weeks disp 1 large tube (60g or equivalent) 8)  Ketoconazole 2 % Crea (Ketoconazole) .... Sig apply to affected area one time daily disp 1 large tube (60g or equivalent)  Other Orders: Direct LDL-[facility] [phone]) Comp Met-[facility] (76160-73710) Prescriptions: KETOCONAZOLE 2 % CREA (KETOCONAZOLE) SIG apply to affected area one time daily DISP 1 large tube (60g or equivalent)  #1 x 2   Entered and Authorized by:   Paula Compton MD   Signed by:   Paula Compton MD on 09/22/2009   Method used:   [facility]ronically to        [facility] [phone][facility] (retail)       [address]       [place], [place]  [address]       Ph: [phone] or [phone]       Fax: [phone]   RxID:   [id number] HYDROCORTISONE 2.5 % CREA (HYDROCORTISONE) SIG Apply to affected area one time daily for up to 2 weeks DISP 1 large tube (60g or equivalent)  #1 x 0   Entered and Authorized by:   Paula Compton MD   Signed by:   Paula Compton MD on 09/22/2009   Method used:   Electronically to        [facility] [phone]* (retail)       [address]       [place], [place]  [address]       Ph: [phone] or [phone]       Fax: [phone]   RxID:   [id number]

## 2010-02-20 NOTE — Consult Note (Signed)
Summary: [facility]  [facility]   Imported By: Marily Memos 02/27/2009 10:41:50  _____________________________________________________________________  External Attachment:    Type:   Image     Comment:   External Document

## 2010-02-20 NOTE — Assessment & Plan Note (Signed)
Summary: urinary tract inf f/up,tcb   Vital Signs:  Patient profile:   60 year old male Height:      70.5 inches Weight:      165 pounds BMI:     23.42 Temp:     98.3 degrees F oral Pulse rate:   73 / minute BP sitting:   145 / 85  (left arm) Cuff size:   regular  Vitals Entered By: Tessie Fass CMA (April 14, 2009 2:24 PM) CC: F/U UTI Is Patient Diabetic? No Pain Assessment Patient in pain? no        Primary Care Provider:  Paula Compton MD  CC:  F/U UTI.  History of Present Illness: Dustin Burton returns today with complaints of recurrence of mild dysuria.  He was seen here recently by Dr Earnest Bailey for sxs which appeared to be related to uncomplicated cystitis; was treated with nitrofurantoin and noted relief.  He has never had any fevers or chills with this illness.  About 3 days ago he noted return of the dysuria, without notable return of the hematuria he had noticed with first presentation.   Of note, he is s/p prostatectomy with Dr Laverle Patter for prostate cancer.  Is regaining complete urinary continence and is still using a pad for this.   Has mild exercise-induced asthma which is also exacerbated when he works outside during tree pollen season.  Would like refill of albuterol inhaler.  Does not feel short of breath now.   Habits & Providers  Alcohol-Tobacco-Diet     Tobacco Status: never  Allergies (verified): 1)  ! * Cialis  Past History:  Past Surgical History: colonoscopy q 5 years--brodie - 11/09/2002  framingham risk score 6% - 02/15/2002 surgery june 2008 by dr Thurston Hole - menicus tear medial and lat in RT knee hand surgery for cat bite - left index finger 1985  April 14, 2009: He is s/p prostatectomy for prostate cancer, [facility]  Physical Exam  General:  Well appearing, no apparent distress.  Neck:  Neck supple  Abdomen:  Bowel sounds positive,abdomen soft and non-tender without masses, organomegaly or hernias noted. No CVA tenderness. No  abdominal masses.  No suprapubic tenderness with palpation.    Impression & Recommendations:  Problem # 1:  DYSURIA (ICD-788.1) Patient with recent cystitis verified by culture (E coli), noticed resolution of sxs with Macrobid for almost a week, now wiht recurrence of symptoms of dysuria.  No fevers or chills.  Has had prostatectomy for prostate cancer, continues to have slightly less control of urine than before.  WIll opt to treat with FQ until repeat urine culture results are back, despite essentially negative UA in the office today.  I will call Dustin Burton at [phone] with results of his urine culture and to see how he is doing.  If worse or no improvement, would recommend followup with his urologist.  The following medications were removed from the medication list:    Macrobid 100 Mg Caps (Nitrofurantoin monohyd macro) .[name]... Take one tablet twice a day for 10 days. His updated medication list for this problem includes:    Levaquin 500 Mg Tabs (Levofloxacin) ..... Sig: take 1 tab by mouth one time daily for 14 days  Orders: Urinalysis-[facility] (00000) [facility]- Est Level  3 (65784)  Problem # 2:  ASTHMA, EXERCISE INDUCED (ICD-493.81) Tree pollen makes this worse.  To refill Albuterol HFA for as needed use.  Not using very often, but would like to have on hand.  The  following medications were removed from the medication list:    Albuterol 90 Mcg/act Aers (Albuterol) ..... Use 2 puffs q 6h prn His updated medication list for this problem includes:    Singulair 10 Mg Tabs (Montelukast sodium) .[name]... 1 by mouth qhs    Proair Hfa 108 (90 Base) Mcg/act Aers (Albuterol sulfate) ..... Sig: take 2 puffs every 4 to 6 hours as needed for wheezing disp 1 hfa  Complete Medication List: 1)  Zocor 40 Mg Tabs (Simvastatin) .[name].. 1 by mouth qhs 2)  Singulair 10 Mg Tabs (Montelukast sodium) .[name].. 1 by mouth qhs 3)  Levaquin 500 Mg Tabs (Levofloxacin) .... Sig: take 1 tab by mouth one time daily for 14 days 4)   Levitra 10 Mg Tabs (Vardenafil hcl) .... Take as directed 5)  Proair Hfa 108 (90 Base) Mcg/act Aers (Albuterol sulfate) .... Sig: take 2 puffs every 4 to 6 hours as needed for wheezing disp 1 hfa Prescriptions: PROAIR HFA 108 (90 BASE) MCG/ACT AERS (ALBUTEROL SULFATE) SIG: Take 2 puffs every 4 to 6 hours as needed for wheezing DISP 1 HFA  #1 x 6   Entered and Authorized by:   Paula Compton MD   Signed by:   Paula Compton MD on 04/14/2009   Method used:   Electronically to        [facility] [phone]* (retail)       [address]       [place], [place]  [address]       Ph: [phone] or [phone]       Fax: [phone]   RxID:   [id number] LEVAQUIN 500 MG TABS (LEVOFLOXACIN) SIG: Take 1 tab by mouth one time daily for 14 days  #14 x 0   Entered and Authorized by:   Paula Compton MD   Signed by:   Paula Compton MD on 04/14/2009   Method used:   Electronically to        [facility] [phone]* (retail)       [address]       [place], [place]  [address]       Ph: [phone] or [phone]       Fax: [phone]   RxID:   [id number]   Laboratory Results   Urine Tests  Date/Time Received: April 14, 2009 2:30 PM  Date/Time Reported: April 14, 2009 2:35 PM   Routine Urinalysis   Color: yellow Appearance: Clear Glucose: negative   (Normal Range: Negative) Bilirubin: negative   (Normal Range: Negative) Ketone: negative   (Normal Range: Negative) Spec. Gravity: 1.015   (Normal Range: 1.003-1.035) Blood: negative   (Normal Range: Negative) pH: 7.0   (Normal Range: 5.0-8.0) Protein: negative   (Normal Range: Negative) Urobilinogen: 0.2   (Normal Range: 0-1) Nitrite: negative   (Normal Range: Negative) Leukocyte Esterace: negative   (Normal Range: Negative)    Comments: ...........test performed by...........Marland KitchenTerese Door, CMA

## 2010-02-20 NOTE — Assessment & Plan Note (Signed)
Summary: rash on face/neck,df   Vital Signs:  Patient profile:   60 year old male Height:      70.5 inches Weight:      160 pounds BMI:     22.71 Temp:     98.0 degrees F oral Pulse rate:   66 / minute BP sitting:   138 / 87  (left arm) Cuff size:   regular  Vitals Entered By: Tessie Fass CMA (December 22, 2009 10:56 AM) CC: rash to the face and neck   Primary Care Provider:  Paula Compton MD  CC:  rash to the face and neck.  History of Present Illness: Patient comes in today for followp on facial rash.  Was seen here for it in early Sept; had been trying topical cortisone with limited success, never really went away.  Is somewhat better today.   Affects central part of face, neck ,and upper chest.  No family members with similar.  From [place] ancestry.   Allergies: 1)  ! * Cialis  Physical Exam  General:  well appearing, no apparent distress Skin:  erythematous blanching skin in central face, including forehead, cheeks, chin.  Minimal redness along neck and upper chest.  No pustules seen; no flaking or seborrhea noted.    Impression & Recommendations:  Problem # 1:  ACNE ROSACEA (ICD-695.3)  Clinical diagnosis of mild  acne rosacea. Will treat as such with metronidazole gel.  Discussed sun exposure, alcohol, as triggers.  He is to call back to let me know how he is doing with this.  For appt if clinical changes that would necessitate another exam.   Orders: [facility]- Est Level  3 (04540)  Problem # 2:  HYPERLIPIDEMIA (ICD-272.4) has been back on the simvastatin for a couple of months now.  Needs refill.  Will recheck LDL and liver transaminases today, counsel on this after labs are back.  His updated medication list for this problem includes:    Zocor 40 Mg Tabs (Simvastatin) .[name]... 1 by mouth qhs  Complete Medication List: 1)  Zocor 40 Mg Tabs (Simvastatin) .[name].. 1 by mouth qhs 2)  Levitra 10 Mg Tabs (Vardenafil hcl) .... Take as directed 3)  Proair Hfa 108 (90 Base)  Mcg/act Aers (Albuterol sulfate) .... Sig: take 2 puffs every 4 to 6 hours as needed for wheezing disp 1 hfa 4)  Singulair 10 Mg Tabs (Montelukast sodium) .[name].. 1 by mouth daily 5)  Qvar 80 Mcg/act Aers (Beclomethasone dipropionate) .[name].. 1 puff two times a day for asthma 6)  Flonase 50 Mcg/act Susp (Fluticasone propionate) .... 2 squirts with in each nostril daily 7)  Hydrocortisone 2.5 % Crea (Hydrocortisone) .... Sig apply to affected area one time daily for up to 2 weeks disp 1 large tube (60g or equivalent) 8)  Ketoconazole 2 % Crea (Ketoconazole) .... Sig apply to affected area one time daily disp 1 large tube (60g or equivalent) 9)  Metronidazole 0.75 % Gel (Metronidazole) .... Sig: apply twice daily to affected area disp 1 large tube  Other Orders: Comp Met-[facility] (98119-14782) Direct LDL-[facility] (95621-30865)  Patient Instructions: 1)  I am giviing you a provisional diagnosis of acne rosacea.  I prescribed MetroGel (metronidazole gel) 0.75% to apply to affected areas twice daily.  May cause drying.  Please be sure to use sunblock.  Alcohol may worsen symptoms.  2)  Please call with followup. Prescriptions: ZOCOR 40 MG  TABS (SIMVASTATIN) 1 by mouth qhs  #30 x 12   Entered  and Authorized by:   Paula Compton MD   Signed by:   Paula Compton MD on 12/22/2009   Method used:   Electronically to        [facility] [phone]* (retail)       [address]       [place], [place]  [address]       Ph: [phone] or [phone]       Fax: [phone]   RxID:   [id number] METRONIDAZOLE 0.75 % GEL (METRONIDAZOLE) SIG: Apply twice daily to affected area DISP 1 large tube  #1 x 4   Entered and Authorized by:   Paula Compton MD   Signed by:   Paula Compton MD on 12/22/2009   Method used:   Electronically to        [facility] [phone]* (retail)       [address]       [place], [place]  [address]       Ph: [phone] or [phone]       Fax: [phone]   RxID:   [id number]    Orders Added: 1)   Comp Met-[facility] [[id number]] 2)  Direct LDL-[facility] [83151-76160] 3)  [facility]- Est Level  3 [73710]

## 2010-02-20 NOTE — Progress Notes (Signed)
  Phone Note Outgoing Call Call back at [facility] Phone [phone]   Call placed by: Paula Compton MD,  December 25, 2009 1:46 PM Call placed to: Patient Summary of Call: Called patient to report lab results, LDL now 123.  Transaminases normal.  he reports that the facial redness is almost completely gone.  To continue with metrogel for now, and statin. Copy of the labs to be sent to him via [facility]PS. Initial call taken by: Paula Compton MD,  December 25, 2009 1:48 PM

## 2010-02-20 NOTE — Progress Notes (Signed)
Summary: Test Res  Phone Note Call from Patient Call back at Home Phone [phone]   Caller: Patient Summary of Call: Pt checking on urine culture from 03/17/09. Initial call taken by: Clydell Hakim,  April 18, 2009 2:28 PM  Follow-up for Phone Call        to PCP Follow-up by: Theresia Lo RN,  April 18, 2009 4:02 PM     I called patient back.  He states that dysuria is less intense but persists.  No fevers.  Is regaining better control.  Polyuria is improving.  I informed him that urine culture not sent at last visit.  He is taking Levaquin.  He will come today to give sample for urine culture and UA.  Paula Compton MD  April 19, 2009 9:50 AM   scheduled on lab schedule. Theresia Lo RN  April 19, 2009 1:42 PM

## 2010-02-20 NOTE — Assessment & Plan Note (Signed)
Summary: np,df   Vital Signs:  Patient profile:   60 year old male Height:      70.5 inches Weight:      155.2 pounds BMI:     22.03  Vitals Entered By: Wyona Almas PHD (November 02, 2009 11:14 AM)  Primary Care Provider:  Paula Compton MD   History of Present Illness: Assessment: Spent 60 min with pt.  Usual eating pattern includes 3 meals and AM and afternoon snacks. Usually works out  ~5 PM.  Last summer training was as follows:  cycle 4 X wk; 3-5 hr Sat, 1.5-2 hr Sun in [place]; wkdays 2 X wk 60-90 min.  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  Since completing a century event in the [place] in Sept, Mir has cut back on training.  He is currently riding  ~ 2 X wk, 45-60 min and 1.5-2 hr; personal trainer 2 X wk; and he walks his dogs 1 hr daily.  Everyday foods/beverages include hot tea w/ 1/2 tsp sugar, half-swt tea, [place] foods frequently, fish 2 X wk.  Tends to limit carb's and high-glycemic foods, but currently when cycling >60 min, consumes: e-lyte chomps, Hammer energy bar w/ protein, sometimes pb&j on bread rounds, water.  24-hr recall suggests intake of 1900-2000 kcal: B (6:30 AM)- tea w/ 1/2 tsp sugar, 1 c microwaved steel-cut oats (200 kcal), walnuts, raisins, 2 tbsp sk milk; Snk (10:30 AM)- handful salted mixed nuts, water; L (PM)- 8" [place] sub w/ let, tom, chs, oil & vin, 12 oz half-swt tea; Snk (4 PM)- 1 large apple, water; Trainer wkout; D (6:30 PM)- 3 c spaghetti w/ mt sauce and chs, salad w/ 1 tbsp dressing, 1 roll w/ 1/2 tsp butter, 12 oz half-swt tea.  More typical lunch would be [place] fajitas, guacamole, tom, let, grilled peppers & onion (at lst 2 X wk).  Rest foods  ~6 X wk.    Nutrition Diagnosis:  Excessive mineral intake (sodium) (NI-55.2) related to restaurant meals as evidenced by eating out  ~6 X wk.  Questionable quality of fat related to restaurant meals as evidenced by eating out  ~6 X wk.  Intervention: See Patient Instructions.    Monitoring/Eval: Per  pt; I think Royale has a good understanding of what he needs to do, but I did suggest he call if any Qs.     Allergies: 1)  ! * Cialis   Complete Medication List: 1)  Zocor 40 Mg Tabs (Simvastatin) .[name].. 1 by mouth qhs 2)  Levitra 10 Mg Tabs (Vardenafil hcl) .... Take as directed 3)  Proair Hfa 108 (90 Base) Mcg/act Aers (Albuterol sulfate) .... Sig: take 2 puffs every 4 to 6 hours as needed for wheezing disp 1 hfa 4)  Singulair 10 Mg Tabs (Montelukast sodium) .[name].. 1 by mouth daily 5)  Qvar 80 Mcg/act Aers (Beclomethasone dipropionate) .[name].. 1 puff two times a day for asthma 6)  Flonase 50 Mcg/act Susp (Fluticasone propionate) .... 2 squirts with in each nostril daily 7)  Hydrocortisone 2.5 % Crea (Hydrocortisone) .... Sig apply to affected area one time daily for up to 2 weeks disp 1 large tube (60g or equivalent) 8)  Ketoconazole 2 % Crea (Ketoconazole) .... Sig apply to affected area one time daily disp 1 large tube (60g or equivalent)  Other Orders: Inital Assessment Each - [facility] (16109)  Patient Instructions: 1)  CHOLESTEROL:  Try to scope out the foods/ingredients at restaurants.  You want to  find those rest's that are using more olive or canola oil AND those rest's that offer VEG's.  2)  Limit sources of sat'd fat; animal fats.    3)  Increase veg's and fruit even more, aim for beans as your protein source at least twice a week, and you may want to incorporate some soy protein foods at meals at least once a week.  (Try edamame hummus.)  4)  My recommendation:  Vit D test and lipid profile.   5)  Fueling for cycling:  Probably best to limit carb's to 60 grams per hour; aim for  ~10-15 g protein per hour.  Post-ex fueling:  Aim for within 30 min, and include at least 15 grams of protein:  Aim for 1.2-1.5 g carbohydrate and 0.25-0.4 g protein per kg body weight (kg = lb divided by 2.2). 6)  Call if any Qs, cal Dr. Gerilyn Pilgrim:  [phone].

## 2010-02-20 NOTE — Consult Note (Signed)
Summary: [facility]   Imported By: Bradly Bienenstock 07/15/2009 13:24:25  _____________________________________________________________________  External Attachment:    Type:   Image     Comment:   External Document

## 2010-02-20 NOTE — Miscellaneous (Signed)
  Clinical Lists Changes Medical Record Request Received medical record request Records went to [facility] Faxed on 11/29/2008 [facility] Pysher  December 22, 2009 2:17 PM

## 2010-02-22 NOTE — Assessment & Plan Note (Signed)
Summary: H and P chest pain   Primary Care Provider:  Paula Compton MD  CC:  epigastric pain/ syncopal episode.  History of Present Illness: Pt reports that he woke up from sleep with epigastric pain that he describes as sharp and burning that radiated to B upper quadrants but worse on the right side.  He sat up to wake his wife and he passed out for 30-45 secs.  Wife says he was unresponsive, seemed stiff with eyes open during that time.  No presyncopal symptoms.  Woke up and felt he was struggling to breathe and was nauseous. Wife called EMS, by the time they arrived his pain was better but when he got up he felt nauseous again.  He recieved zofran and felt better.  denies chest pain or SOB during exercise.  No fevers, no diarrhea or constipation.    Current Medications (verified): 1)  Zocor 40 Mg  Tabs (Simvastatin) .[name].. 1 By Mouth Qhs 2)  Levitra 10 Mg Tabs (Vardenafil Hcl) .... Take As Directed 3)  Proair Hfa 108 (90 Base) Mcg/act Aers (Albuterol Sulfate) .... Sig: Take 2 Puffs Every 4 To 6 Hours As Needed For Wheezing Disp 1 Hfa 4)  Singulair 10 Mg Tabs (Montelukast Sodium) .[name].. 1 By Mouth Daily 5)  Qvar 80 Mcg/act Aers (Beclomethasone Dipropionate) .[name].. 1 Puff Two Times A Day For Asthma 6)  Flonase 50 Mcg/act Susp (Fluticasone Propionate) .... 2 Squirts With in Each Nostril Daily 7)  Hydrocortisone 2.5 % Crea (Hydrocortisone) .... Sig Apply To Affected Area One Time Daily For Up To 2 Weeks Disp 1 Large Tube (60g or Equivalent) 8)  Ketoconazole 2 % Crea (Ketoconazole) .... Sig Apply To Affected Area One Time Daily Disp 1 Large Tube (60g or Equivalent) 9)  Metronidazole 0.75 % Gel (Metronidazole) .... Sig: Apply Twice Daily To Affected Area Disp 1 Large Tube  Allergies (verified): 1)  ! * Cialis  Past History:  Past Medical History: Last updated: 04/23/2007 brachial plexitis possible situational stress/ depression 03/2004 Temporary elevation of LFT, now WNL 7/03 hospitalizes with  chest pain 03/2006 - workup with [facility] cards was negative chest pain workup suggests stress a factor 2008  spondylolithesis L5/S1 diagnosed 1978 - Hx of occassional LBP  Iritis left eye - dr Pearlean Brownie march 2009  Past Surgical History: Last updated: 04/14/2009 colonoscopy q 5 years--brodie - 11/09/2002  framingham risk score 6% - 02/15/2002 surgery june 2008 by dr Thurston Hole - menicus tear medial and lat in RT knee hand surgery for cat bite - left index finger 1985  April 14, 2009: He is s/p prostatectomy for prostate cancer, [facility]. [facility]  Family History: Last updated: 05/31/2008 cad--mom 60`s,  colon CA 1st degree--dad,  htn--parents, grands,  hyperlipidemia--brother, mom 2 brothers - well; one with elevated cholesterol; 1 with migraines 2 sisters - both healthy  Both parents were diagnosed with Crohns disease later in life.  No prostate cancer in family.   Social History: Last updated: 05/15/2009 married joan Melroy;  Now director of [employer] Never Smoked Regular exercise-yes Alcohol use-yes - social only Avid cyclist, used to ride 150 miles weekly.   Risk Factors: Exercise: yes (04/23/2007)  Risk Factors: Smoking Status: never (08/07/2009)  Review of Systems       The patient complains of chest pain and syncope.  The patient denies anorexia, fever, weight loss, dyspnea on exertion, prolonged cough, and headaches.    Physical Exam  General:  alert, well-developed, well-nourished, and well-hydrated.  Head:  normocephalic and atraumatic.   Eyes:  vision grossly intact, pupils equal, pupils round, and pupils reactive to light.   Neck:  supple and full ROM.   Chest Wall:  no deformities and no tenderness.   Lungs:  normal respiratory effort and normal breath sounds.   Heart:  normal rate and regular rhythm.   Abdomen:  mild tenderness over epigastruma nd right upper quadrant Msk:  normal ROM and no joint swelling.   Extremities:  no  edema Neurologic:  alert & oriented X3, cranial nerves II-XII intact, and strength normal in all extremities.   Skin:  Intact without suspicious lesions or rashes Additional Exam:  CXR-Emphysema without acute disease. CT head- No acute abnormality. D-Dimer, Fibrin Derivatives              <0.22 BMET WNL POC CE negative   Complete Medication List: 1)  Zocor 40 Mg Tabs (Simvastatin) .[name].. 1 by mouth qhs 2)  Levitra 10 Mg Tabs (Vardenafil hcl) .... Take as directed 3)  Proair Hfa 108 (90 Base) Mcg/act Aers (Albuterol sulfate) .... Sig: take 2 puffs every 4 to 6 hours as needed for wheezing disp 1 hfa 4)  Singulair 10 Mg Tabs (Montelukast sodium) .[name].. 1 by mouth daily 5)  Qvar 80 Mcg/act Aers (Beclomethasone dipropionate) .[name].. 1 puff two times a day for asthma 6)  Flonase 50 Mcg/act Susp (Fluticasone propionate) .... 2 squirts with in each nostril daily 7)  Hydrocortisone 2.5 % Crea (Hydrocortisone) .... Sig apply to affected area one time daily for up to 2 weeks disp 1 large tube (60g or equivalent) 8)  Ketoconazole 2 % Crea (Ketoconazole) .... Sig apply to affected area one time daily disp 1 large tube (60g or equivalent) 9)  Metronidazole 0.75 % Gel (Metronidazole) .... Sig: apply twice daily to affected area disp 1 large tube     A/P Pt is a 60 yo with HL presenting with epigastric pain and syncope.  1. epigastric pain/chest pain- Pt is an avid biker and during the winter goes to spinning class several times per week.  He denies any SOB or CP during the exercise but has noticed more fatigue and more sweating over the last several weeks. Pt was worked up for atypical CP in 2008 with a negative myoview.  THis pain is atypical for cardiogenic pain.  I think a GI source is more likely since pt also reported a lot of belching during the CXR.  I will get a RUQ [place] to evaluate gallbladder.  Protonix.  WIll also get serial CE x 3. Repeat EKG in AM.  2. syncopal episode- Unclear what the cause of  this is.  He has no hx of arrhythmia but does have a hx of syncopal episodes during vomitting.  I will place on tele monitoring while inpt.  Consider ECHO to eval for valvular problem or myxoma.   3. HL- continue simvastatin 4. Dispo- Pending clinical improvement.

## 2010-02-22 NOTE — Assessment & Plan Note (Signed)
Summary: uti per pt/eo   Vital Signs:  Patient profile:   60 year old male Height:      70.5 inches Weight:      163.2 pounds BMI:     23.17 Temp:     98.1 degrees F oral Pulse rate:   75 / minute BP sitting:   134 / 85  (left arm) Cuff size:   regular  Vitals Entered By: Garen Grams LPN (February 15, 2010 11:17 AM) CC: ? UTI Is Patient Diabetic? No Pain Assessment Patient in pain? no        Primary Care Provider:  Paula Compton MD  CC:  ? UTI.  History of Present Illness: 1. ? UTI:  Pt with a hx of prostate cancer s/p prostectomy.  He had the surgery about 1 year ago.  Shortly after the surgery he began having pain with urination, lower pelvic discomfort.  He didn't seek care right away and ended up having hematuria from a severe cystitis.  He comes into clinic today because he thinks that he is having another UTI.  He is having pain with urination and lower abdominal discomfort.  He also endorses mild low back pain.  ROS: denies fevers, chills, malaise  Habits & Providers  Alcohol-Tobacco-Diet     Tobacco Status: never  Allergies: 1)  ! * Cialis  Social History: Reviewed history from 05/15/2009 and no changes required. 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   Physical Exam  General:  Vitals reviewed.  well appearing, no apparent distress Lungs:  Normal respiratory effort, chest expands symmetrically. Lungs are clear to auscultation, no crackles or wheezes. Heart:  Normal rate and regular rhythm. S1 and S2 normal without gallop, murmur, click, rub or other extra sounds. Abdomen:  Bowel sounds positive,abdomen soft and non-tender without masses, organomegaly or hernias noted.  Minimally TTP suprapubic.   Impression & Recommendations:  Problem # 1:  DYSURIA (ICD-788.1) Assessment New Negative UA in clinic.  Reviewed PN from last  visit with similar complaints and UA was negative then but he did improve with antibiotics.  The hx is concerning for a UTI especially in a patient that is s/p prostectomy.  Will treat with Cipro just to be sure.  Precautions given. His updated medication list for this problem includes:    Ciprofloxacin Hcl 500 Mg Tabs (Ciprofloxacin hcl) .[name]... 1 tab by mouth twice a day for 10 days  Orders: Urinalysis-[facility] (00000) [facility]- Est Level  3 (27253)  Complete Medication List: 1)  Zocor 40 Mg Tabs (Simvastatin) .[name].. 1 by mouth qhs 2)  Levitra 10 Mg Tabs (Vardenafil hcl) .... Take as directed 3)  Proair Hfa 108 (90 Base) Mcg/act Aers (Albuterol sulfate) .... Sig: take 2 puffs every 4 to 6 hours as needed for wheezing disp 1 hfa 4)  Singulair 10 Mg Tabs (Montelukast sodium) .[name].. 1 by mouth daily 5)  Qvar 80 Mcg/act Aers (Beclomethasone dipropionate) .[name].. 1 puff two times a day for asthma 6)  Flonase 50 Mcg/act Susp (Fluticasone propionate) .... 2 squirts with in each nostril daily 7)  Hydrocortisone 2.5 % Crea (Hydrocortisone) .... Sig apply to affected area one time daily for up to 2 weeks disp 1 large tube (60g or equivalent) 8)  Ketoconazole 2 % Crea (Ketoconazole) .... Sig apply to affected area one time daily disp 1 large tube (60g or equivalent) 9)  Metronidazole 0.75 % Gel (Metronidazole) .... Sig: apply twice daily to affected area disp 1 large tube 10)  Protonix 40 Mg Tbec (Pantoprazole sodium) .[name].. 1 by mouth once daily 11)  Ciprofloxacin Hcl 500 Mg Tabs (Ciprofloxacin hcl) .[name].. 1 tab by mouth twice a day for 10 days  Patient Instructions: 1)  Your UA actually looked okay 2)  However, since you are having symptoms very consistent with a UTI, I am going to still treat you 3)  If not better in a 4-5 days please let us know 4)  Schedule a follow up appointment if not better Prescriptions: CIPROFLOXACIN HCL 500 MG TABS (CIPROFLOXACIN HCL) 1 tab by mouth twice a day for 10 days  #20 x 0   Entered  and Authorized by:   Angelena Sole MD   Signed by:   Angelena Sole MD on 02/15/2010   Method used:   Electronically to        [phone] (retail)       [address]       [place], [place]  [address]       Ph: [phone] or [phone]       Fax: [phone]   RxID:   [id number]    Orders Added: 1)  Urinalysis-[facility] [00000] 2)  [facility]- Est Level  3 [99213]    Laboratory Results   Urine Tests  Date/Time Received: February 15, 2010 11:14 AM  Date/Time Reported: February 15, 2010 11:50 AM   Routine Urinalysis   Color: yellow Appearance: Clear Glucose: negative   (Normal Range: Negative) Bilirubin: negative   (Normal Range: Negative) Ketone: negative   (Normal Range: Negative) Spec. Gravity: 1.025   (Normal Range: 1.003-1.035) Blood: negative   (Normal Range: Negative) pH: 5.5   (Normal Range: 5.0-8.0) Protein: negative   (Normal Range: Negative) Urobilinogen: 0.2   (Normal Range: 0-1) Nitrite: negative   (Normal Range: Negative) Leukocyte Esterace: negative   (Normal Range: Negative)    Comments: .........Marland Kitchenbiochemical negative; microscopic not indicated ...............test performed by......Marland KitchenBonnie A. [place], MLS (ASCP)cm

## 2010-02-22 NOTE — Assessment & Plan Note (Signed)
Summary: hosp f/u [facility]   Vital Signs:  Patient profile:   60 year old male Height:      70.5 inches Weight:      162 pounds BMI:     23.00 Pulse rate:   63 / minute BP sitting:   122 / 78  (left arm) Cuff size:   regular  Vitals Entered By: Dustin Burton CMA (January 24, 2010 8:44 AM) CC: hospital f/u   Primary Care Provider:  Paula Compton MD  CC:  hospital f/u.  History of Present Illness: Hospital followup; Dustin Burton was in [place] on Dec 26th for observation, for chest pain/upper abd pain and episode of syncope.  Discharge summary reviewed today.  In sum, his symptoms were most consistent with a GI cause to his pain, coupled with vagal response to pain.  He was discharged on Protonix 40mg  daily, whcih he continues to take.  Has noticed a recurrence of similar character pain with consumption of carbonated beverages, and with chocolate.  Has tried to avoid them.  Has not noticed any weight loss, change in stool appearance or consistency, emesis, or chest pain.   Has remained active with a personal trainer, reports some abd wall muscle tenderness but does not take NSAIDs (other than buffered ASA 81mg  for cardiac prevention).  Has never been tested for H pylori that he is aware of.   Last colonoscopy with Dustin Dustin Burton @ 10 yrs ago.  Family hx father with CRC in his 71s; Dustin Burton has been screened multiple times, due again around March 2012.   No EGD history that he is aware of.   Allergies: 1)  ! * Cialis  Family History: Reviewed history from 05/31/2008 and no changes required. cad--mom 60`s,  colon CA 1st degree--dad,  htn--parents, grands,  hyperlipidemia--brother, mom 2 brothers - well; one with elevated cholesterol; 1 with migraines 2 sisters - both healthy  Both parents were diagnosed with Crohns disease later in life.  No prostate cancer in family.   Social History: Reviewed history from 05/15/2009 and no changes required. 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   Physical Exam  General:  well appearing, no apparent distress Mouth:  Oral mucosa and oropharynx without lesions or exudates.  Teeth in good repair. Neck:  No deformities, masses, or tenderness noted. Lungs:  Normal respiratory effort, chest expands symmetrically. Lungs are clear to auscultation, no crackles or wheezes. Heart:  Normal rate and regular rhythm. S1 and S2 normal without gallop, murmur, click, rub or other extra sounds. Abdomen:  Bowel sounds positive,abdomen soft and non-tender without masses, organomegaly or hernias noted.   Impression & Recommendations:  Problem # 1:  ACUTE GASTRITIS WITHOUT MENTION OF HEMORRHAGE (ICD-535.00) Symptoms suggestive of erosive gastritis versus PUD; is currently on his second week of PPI therapy.  H pylori Ab testing today negative for H pylori.  Would recommend completion of the six-weeks of PPI therapy, avoidance of offending agents and NSAIDs.  Will contact Dustin Burton about including EGD on his next endoscopic evaluation at her discretion.  For followup after completion of PPI, or sooner i needed.  Orders: H pylori-[facility] [id number]) Gastroenterology Referral (GI) [facility]- Est  Level 4 (78469)  His updated medication list for this problem includes:    Protonix 40 Mg Tbec (Pantoprazole sodium) .[name]... 1 by mouth once daily  Problem # 2:  HYPERCHOLESTEROLEMIA (  ICD-272.0)  For next complete Lipid Profile fasting in May 2012.   His updated medication list for this problem includes:    Zocor 40 Mg Tabs (Simvastatin) .[name]... 1 by mouth qhs  Orders: [facility]- Est  Level 4 (99214)  Complete Medication List: 1)  Zocor 40 Mg Tabs (Simvastatin) .[name].. 1 by mouth qhs 2)  Levitra 10 Mg Tabs (Vardenafil hcl) .... Take as directed 3)  Proair Hfa 108 (90 Base) Mcg/act Aers (Albuterol sulfate) .... Sig: take 2 puffs every 4 to 6 hours as  needed for wheezing disp 1 hfa 4)  Singulair 10 Mg Tabs (Montelukast sodium) .[name].. 1 by mouth daily 5)  Qvar 80 Mcg/act Aers (Beclomethasone dipropionate) .[name].. 1 puff two times a day for asthma 6)  Flonase 50 Mcg/act Susp (Fluticasone propionate) .... 2 squirts with in each nostril daily 7)  Hydrocortisone 2.5 % Crea (Hydrocortisone) .... Sig apply to affected area one time daily for up to 2 weeks disp 1 large tube (60g or equivalent) 8)  Ketoconazole 2 % Crea (Ketoconazole) .... Sig apply to affected area one time daily disp 1 large tube (60g or equivalent) 9)  Metronidazole 0.75 % Gel (Metronidazole) .... Sig: apply twice daily to affected area disp 1 large tube 10)  Protonix 40 Mg Tbec (Pantoprazole sodium) .[name].. 1 by mouth once daily  Other Orders: Future Orders: Lipid-[facility] ([id number]) ... 02/07/2011  Patient Instructions: 1)  It was a pleasure to see you today.  I recommend you continue your protonix daily through the month of January.  2)  We are testing you for a bacteria called H pylori.  If the test is positive, then we will treat you for the bacteria for 14 days.  3)  Please refrain from the foods and beverages that provoke your symptoms (chocolate, carbonated beverages), as well as anti-inflammatories.  Hold your buffered aspirin for the remainder of January, until you complete the Protonix. 4)  I would like Dustin Burton to know of your presumptive diagnosis of gastritis/possible ulcer disease, for the purposes of planning for upper endoscopy (EGD) when you go for screening colonoscopy in March.   Orders Added: 1)  H pylori-[facility] [87339] 2)  Gastroenterology Referral [GI] 3)  Lipid-[facility] [[id number]] 4)  [facility]- Est  Level 4 [91478]    Laboratory Results   Blood Tests   Date/Time Received: January 24, 2010 9:10 AM  Date/Time Reported: January 24, 2010 9:49 AM    H. pylori: negative Comments: ...............test performed by......Marland KitchenBonnie A. [place], MLS (ASCP)cm

## 2010-03-31 ENCOUNTER — Other Ambulatory Visit: Payer: Self-pay | Admitting: Family Medicine

## 2010-04-01 NOTE — Telephone Encounter (Signed)
Refill request

## 2010-04-02 LAB — COMPREHENSIVE METABOLIC PANEL
AST: 31 U/L (ref 0–37)
Albumin: 3.6 g/dL (ref 3.5–5.2)
Calcium: 9.3 mg/dL (ref 8.4–10.5)
Creatinine, Ser: 0.88 mg/dL (ref 0.4–1.5)
GFR calc Af Amer: >60 mL/min (ref >60)

## 2010-04-02 LAB — BASIC METABOLIC PANEL
CO2: 27 mEq/L (ref 19–32)
Calcium: 8.8 mg/dL (ref 8.4–10.5)
Creatinine, Ser: 0.98 mg/dL (ref 0.4–1.5)
GFR calc Af Amer: >60 mL/min (ref >60)
GFR calc non Af Amer: >60 mL/min (ref >60)

## 2010-04-02 LAB — CBC
Hemoglobin: 12.6 g/dL — ABNORMAL LOW (ref 13.0–17.0)
MCH: 31.3 pg (ref 26.0–34.0)
MCV: 93.1 fL (ref 78.0–100.0)
Platelets: 232 10*3/uL (ref 150–400)
RBC: 4.03 MIL/uL — ABNORMAL LOW (ref 4.22–5.81)

## 2010-04-02 LAB — CK TOTAL AND CKMB (NOT AT ARMC)
CK, MB: 0.9 ng/mL (ref 0.3–4.0)
Relative Index: INVALID (ref 0.0–2.5)
Total CK: 90 U/L (ref 7–232)

## 2010-04-02 LAB — DIFFERENTIAL
Eosinophils Absolute: 0.2 10*3/uL (ref 0.0–0.7)
Eosinophils Relative: 3 % (ref 0–5)
Lymphs Abs: 1.4 10*3/uL (ref 0.7–4.0)
Monocytes Absolute: 1.2 10*3/uL — ABNORMAL HIGH (ref 0.1–1.0)
Monocytes Relative: 17 % — ABNORMAL HIGH (ref 3–12)

## 2010-04-02 LAB — CARDIAC PANEL(CRET KIN+CKTOT+MB+TROPI)
CK, MB: 1 ng/mL (ref 0.3–4.0)
Relative Index: INVALID (ref 0.0–2.5)
Troponin I: 0.01 ng/mL (ref 0.00–0.06)
Troponin I: 0.01 ng/mL (ref 0.00–0.06)

## 2010-04-02 LAB — D-DIMER, QUANTITATIVE: D-Dimer, Quant: <0.22 ug/mL-FEU (ref 0.00–0.48)

## 2010-04-24 LAB — CBC
HCT: 45 % (ref 39.0–52.0)
MCHC: 33.2 g/dL (ref 30.0–36.0)
MCV: 97 fL (ref 78.0–100.0)
RBC: 4.64 MIL/uL (ref 4.22–5.81)

## 2010-04-24 LAB — HEMOGLOBIN AND HEMATOCRIT, BLOOD
HCT: 34.9 % — ABNORMAL LOW (ref 39.0–52.0)
HCT: 35.5 % — ABNORMAL LOW (ref 39.0–52.0)
Hemoglobin: 11.7 g/dL — ABNORMAL LOW (ref 13.0–17.0)
Hemoglobin: 11.8 g/dL — ABNORMAL LOW (ref 13.0–17.0)

## 2010-04-24 LAB — BASIC METABOLIC PANEL
CO2: 30 mEq/L (ref 19–32)
Chloride: 105 mEq/L (ref 96–112)
Creatinine, Ser: 0.99 mg/dL (ref 0.4–1.5)
GFR calc Af Amer: >60 mL/min (ref >60)
Potassium: 4.7 mEq/L (ref 3.5–5.1)

## 2010-04-24 LAB — TYPE AND SCREEN: ABO/RH(D): A POS

## 2010-04-26 LAB — POCT HEMOGLOBIN-HEMACUE: Hemoglobin: 15.6 g/dL (ref 13.0–17.0)

## 2010-05-07 ENCOUNTER — Ambulatory Visit (INDEPENDENT_AMBULATORY_CARE_PROVIDER_SITE_OTHER): Payer: 59 | Admitting: Family Medicine

## 2010-05-07 ENCOUNTER — Telehealth: Payer: Self-pay | Admitting: *Deleted

## 2010-05-07 ENCOUNTER — Encounter: Payer: Self-pay | Admitting: Family Medicine

## 2010-05-07 VITALS — BP 132/81 | HR 76 | Temp 98.1°F | Wt 157.8 lb

## 2010-05-07 DIAGNOSIS — R300 Dysuria: Secondary | ICD-10-CM

## 2010-05-07 DIAGNOSIS — R1032 Left lower quadrant pain: Secondary | ICD-10-CM | POA: Insufficient documentation

## 2010-05-07 DIAGNOSIS — K645 Perianal venous thrombosis: Secondary | ICD-10-CM | POA: Insufficient documentation

## 2010-05-07 LAB — POCT URINALYSIS DIPSTICK
Blood, UA: NEGATIVE
Glucose, UA: NEGATIVE
Nitrite, UA: NEGATIVE
Protein, UA: NEGATIVE
Spec Grav, UA: 1.02
Urobilinogen, UA: 0.2
pH, UA: 5.5

## 2010-05-07 MED ORDER — HYOSCYAMINE SULFATE 0.125 MG SL SUBL: 0.1250 mg | SUBLINGUAL_TABLET | SUBLINGUAL | Status: AC | PRN

## 2010-05-07 NOTE — Progress Notes (Signed)
  Subjective:    Patient ID: Dustin Burton, male    DOB: 03-31-1950, 60 y.o.   MRN: [id number]  HPI 2-3 weeks of LLQ cramping type abdominal pain with increase in frequency of stools.  Painful hemorrhoid, making the frequent stools even more uncomfortable.  Has not been able to ride his bike.  Has had previous bouts of IBS.  Last colonoscopy 10 years ago, had polyps and then a clean study.  2010 had radical prostatectomy for cancer.  Admission in December for epigastric pain and had a cardiac work up with admission.   He reports being under a lot of stress with the death of his niece in her 32s from cystic fibrosis.  Denies urinary complaints.      Review of Systems  Constitutional: Negative for fever, chills, activity change and unexpected weight change.  Cardiovascular: Negative for chest pain.  Gastrointestinal: Positive for abdominal pain, diarrhea and rectal pain. Negative for nausea, vomiting, constipation, blood in stool, abdominal distention and anal bleeding.  Genitourinary: Negative for dysuria, enuresis, difficulty urinating and testicular pain.       Objective:   Physical Exam  Constitutional: He appears well-developed and well-nourished.  Abdominal: Soft. Bowel sounds are normal. He exhibits no distension and no mass. There is no rebound and no guarding.       LLQ tenderness  Genitourinary:       1 cm firm external hemorrhoid, stool heme negative.  Skin: Skin is warm and dry.          Assessment & Plan:

## 2010-05-07 NOTE — Assessment & Plan Note (Signed)
Apt with general surgery tomorrow for evacuation.  Metamucil to bulk stool.

## 2010-05-07 NOTE — Patient Instructions (Signed)
We will make the apt with GI and surgery for you May use the Levsin under your tongue for the cramping, the major side effect is constipation and be careful not to overshoot the goal, because hard stool will be painful to pass Consider using Metamucil to bulk stool, start with 1 T in water and may increase to 3 T; this will have less side effects in general.

## 2010-05-07 NOTE — Assessment & Plan Note (Signed)
Sound more IBS but patient with past history of polyps and prostate cancer; will get him in with GI in the next few weeks.  Levsin to control cramping, Metamucil to bulk stool.

## 2010-05-07 NOTE — Telephone Encounter (Signed)
Scheduled appointment for patient with [facility] on 05/08/2010 at 2:30 and Dr Juanda Chance, Enid Baas on 05/30/2010 at 9:15. Informed patient of both appointments in ofice.Busick, Rodena Medin

## 2010-05-10 ENCOUNTER — Encounter: Payer: Self-pay | Admitting: Internal Medicine

## 2010-05-22 ENCOUNTER — Encounter (INDEPENDENT_AMBULATORY_CARE_PROVIDER_SITE_OTHER): Payer: Self-pay | Admitting: Surgery

## 2010-05-30 ENCOUNTER — Encounter: Payer: Self-pay | Admitting: Internal Medicine

## 2010-05-30 ENCOUNTER — Ambulatory Visit (INDEPENDENT_AMBULATORY_CARE_PROVIDER_SITE_OTHER): Payer: 59 | Admitting: Internal Medicine

## 2010-05-30 VITALS — BP 110/60 | HR 72 | Ht 71.0 in | Wt 160.0 lb

## 2010-05-30 DIAGNOSIS — Z800 Family history of malignant neoplasm of digestive organs: Secondary | ICD-10-CM

## 2010-05-30 DIAGNOSIS — R1032 Left lower quadrant pain: Secondary | ICD-10-CM

## 2010-05-30 DIAGNOSIS — Z86010 Personal history of colonic polyps: Secondary | ICD-10-CM

## 2010-05-30 MED ORDER — PEG-KCL-NACL-NASULF-NA ASC-C 100 G PO SOLR: 1.0000 | Freq: Once | ORAL | Status: AC

## 2010-05-30 NOTE — Progress Notes (Signed)
Dustin Burton Sep 06, 1950 MRN [id number]    History of Present Illness:  This is a 60 year old white male with a recent onset of left lower quadrant discomfort and increased frequency of stools followed by an episode of thrombosed hemorrhoid which was evacuated by Dr. Michaell Cowing on 05/05/2010. He is back to normal but still has some discomfort in the left lower quadrant. His hemoglobin was 12.6 and hematocrit was 37.5. He had a robotic prostatectomy in December 2010. A post operative CT scan of the abdomen did not show any metastatic disease. He has a family history of colon cancer in his father and a personal history of an adenomatous polyp in 1994. He had 3 prior colonoscopies; one in 1994, 1996 and 2002. He did not undergo radiation to his prostate. He denies any rectal bleeding. He bikes about 100 miles a week. He has been recently under stress do to the death of his niece from cystic fibrosis. Around Christmas 2011, he developed epigastric pain which responded to PPI's. He still takes protonix BID. He has frequent heartburn while he is biking.   Past Medical History  Diagnosis Date  . Cancer     PROSTATE  . Hemorrhoids   . Brachial plexitis   . Elevated LFTs 07/2001  . IBS (irritable bowel syndrome)    Past Surgical History  Procedure Date  . Prostatectomy   . Knee surgery     right  . Hand surgery     left index finger    reports that he has never smoked. He has never used smokeless tobacco. He reports that he drinks alcohol. He reports that he does not use illicit drugs. family history includes Colon cancer in his father; Coronary artery disease in his mother; Crohn's disease in his father and mother; Cystic fibrosis in an unspecified family member; and Hypertension in his father and mother. Allergies  Allergen Reactions  . Tadalafil         Review of Systems: Denies shortness of breath or chest pain. Positive for left lower quadrant abdominal pain, denies rectal bleeding.  The  remainder of the 10  point ROS is negative except as outlined in H&P   Physical Exam: General appearance  Well developed, in no distress. Eyes- non icteric. HEENT nontraumatic, normocephalic. Mouth no lesions, tongue papillated, no cheilosis. Neck supple without adenopathy, thyroid not enlarged, no carotid bruits, no JVD. Lungs Clear to auscultation bilaterally. Cor normal S1 normal S2, regular rhythm , no murmur,  quiet precordium. Abdomen soft with minimal tenderness in left lower quadrant. No palpable mass. Good muscle tone. Normal active bowel sounds. Liver edge at costal margin. Rectal: Increased rectal sphincter tone. Soft Hemoccult negative stool with a small speck of Hemoccult-positive stool in the specimen. Extremities no pedal edema. Skin no lesions. Neurological alert and oriented x 3. Psychological normal mood and affect.  Assessment and Plan:  Problems #1 left lower quadrant abdominal pain needs to be further evaluated in the setting of a family history of colon cancer and personal history of adenomatous polyps. It may represent irritable bowel syndrome secondary to stress. It may also represent diverticulosis or segmental colitis. We will set him up for colonoscopy.   Problem #2 epigastric discomfort responsive to PPI. He would like to have this assessed at the time of his colonoscopy. We will proceed with an upper endoscopy and appropriate biopsies to rule out H. Pylori.  Problem #3 thrombosed hemorrhoid. This is not an active problem. The hemorrhoid was evacuated 4 weeks ago  and patient is now doing well.   05/30/2010 Lina Sar

## 2010-05-30 NOTE — Patient Instructions (Addendum)
You have been scheduled for an endoscopy and colonoscopy. Please follow the written instructions given to you at your visit today. Please pick up your Moviprep at the pharmacy within the next 2-3 days.  Dr Paula Compton

## 2010-06-05 NOTE — Op Note (Signed)
[name], Dustin Burton                  ACCOUNT NO.:  [id number]   MEDICAL RECORD NO.:  [id number]          PATIENT TYPE:  AMB   LOCATION:  DSC                          FACILITY:  [facility]   PHYSICIAN:  Robert A. Thurston Hole, M.D. DATE OF BIRTH:  [birth year]   DATE OF PROCEDURE:  DATE OF DISCHARGE:                               OPERATIVE REPORT   PREOPERATIVE DIAGNOSIS:  Left knee medial meniscus tear.   POSTOPERATIVE DIAGNOSIS:  Left knee medial meniscus tear.   PROCEDURE:  Left knee examination under anesthesia followed by  arthroscopic partial medial meniscectomy.   SURGEON:  Elana Alm. Thurston Hole, M.D.   ANESTHESIA:  Local and MAC.   OPERATIVE TIME:  30 minutes.   COMPLICATIONS:  None.   INDICATION FOR PROCEDURE:  [name] is a 60 year old gentleman who has  had 3 months of increasing left knee pain, with examined MRI documenting  medial meniscus tear.  He has failed conservative care and he is now to  undergo arthroscopy.   DESCRIPTION:  [name] was brought to the operating room on June 29, 2007, after a knee block was placed in holding area by anesthesia.  He  was placed on the operating table in the supine position.  His left knee  was examined under anesthesia.  He had full range of motion.  He had a  stable ligamentous exam with normal patellar tracking.  The left leg was  prepped using sterile DuraPrep and draped using sterile technique.  Originally, through an anterolateral portal, the arthroscope with a pump  attached was placed and through an anteromedial portal, an arthroscopic  probe was placed.  On initial inspection of the medial compartment, he  had grade 1 to 2 chondromalacia. Medial meniscus tear in the posterior  and medial horn, of which 30%-40% was resected back to stable rim.  Intercondylar notch inspected.  Anterior and posterior crucial ligaments  were normal.  Lateral compartment was inspected.  The articular  cartilage normal, meniscus normal.  Patellofemoral  joint, the articular  cartilage was normal.  The patella tracked normally.  The medial and  lateral gutters were free of pathology.  At this point, it was felt that  all pathology have been satisfactorily addressed.  The instruments were  removed.  The portals were closed with 3-0 nylon suture and injected  with 0.25% Marcaine with epinephrine and 4 mg of morphine.  Sterile  dressings were applied, and the patient awakened and taken to the  recovery room in stable condition.   FOLLOWUP CARE:  [name] will be followed as an outpatient on Vicodin  and Mobic.  He will be seen back in the office in a week for sutures out  and followup.      Robert A. Thurston Hole, M.D.  Electronically Signed     RAW/MEDQ  D:  06/29/2007  T:  06/29/2007  Job:  [id number]

## 2010-06-05 NOTE — Op Note (Signed)
NAMESABRINA, Dustin Burton                  ACCOUNT NO.:  [id number]   MEDICAL RECORD NO.:  [id number]          PATIENT TYPE:  AMB   LOCATION:  DSC                          FACILITY:  [facility]   PHYSICIAN:  Robert A. Thurston Hole, M.D. DATE OF BIRTH:  Apr 03, 1950   DATE OF PROCEDURE:  07/01/2006  DATE OF DISCHARGE:                               OPERATIVE REPORT   PREOPERATIVE DIAGNOSIS:  Right knee medial and lateral meniscal tear  with chondromalacia and synovitis.   POSTOPERATIVE DIAGNOSIS:  Right knee medial and lateral meniscal tear  with chondromalacia and synovitis.   PROCEDURE:  1. Right knee evaluation under anesthesia followed by arthroscopic      partial medial and lateral meniscectomies.  2. Right knee chondroplasty with partial synovectomy.   SURGEON:  Salvatore Marvel, M.D.   ASSISTANT:  Julien Girt, P.A.   ANESTHESIA:  Local and monitored anesthesia care.   OPERATIVE TIME:  30 minutes.   COMPLICATIONS:  None.   INDICATIONS FOR PROCEDURE:  Mr. Trost is a 60 year old gentleman who  injured his right knee playing tennis with a twisting injury 6 weeks  ago.  X-rays and exam consistent with medial meniscus tear.  He has  failed conservative care and is now to undergo arthroscopy.   DESCRIPTION OF PROCEDURE:  Mr. Calica was brought in the operating room  on July 01, 2006, after the knee block was placed and holder by  anesthesia.  He was placed on the operating room in the supine position.  His right knee was examined under anesthesia.  He had full range of  motion and he was a stable ligamentous exam with normal patella  tracking.  The right leg was prepped using sterile DuraPrep and draped  using sterile technique.  Originally through an anterolateral portal,  the arthroscope with a pump test was placed through an anterior-medial  portal and arthroscopic probe was placed.  On initial inspection of the  medial compartment, the articular cartilage showed 25% grade 3  chondromalacia which was debrided.  The medial meniscus tear of  posterior medial horn of which 40-50% was resected back to a stable rim.  The intercondylar notch inspected, the anterior and posterior cruciate  ligaments were normal.  The lateral compartment inspected, 25% grade 3  chondromalacia which was debrided.  In the lateral meniscus, there was a  small posterior-medial root tear of 20% was resected back to a stable  rim.  Patellofemoral joint showed grade 1 and 2 chondromalacia.  The  patella tracked normally.  Moderate synovitis in the medial-lateral  gutters debrided, otherwise, they were free of pathology.  After this  was done, it was felt that all pathology had been satisfactorily  addressed. The instruments were removed.  The portals were closed with 3-  0 nylon suture and injected with 0.25% Marcaine with epinephrine and 4  mg of morphine.  Sterile dressings were applied and the patient awakened  and taken to the recovery room in stable condition.   FOLLOW-UP CARE:  Mr. Barton will be followed as an outpatient on Vicodin  and Mobic.  Seen back  in the office in a week for sutures out and  followup.      Robert A. Thurston Hole, M.D.  Electronically Signed     RAW/MEDQ  D:  07/01/2006  T:  07/01/2006  Job:  [id number]

## 2010-06-08 ENCOUNTER — Encounter: Payer: Self-pay | Admitting: Family Medicine

## 2010-06-08 ENCOUNTER — Ambulatory Visit (INDEPENDENT_AMBULATORY_CARE_PROVIDER_SITE_OTHER): Payer: 59 | Admitting: Family Medicine

## 2010-06-08 VITALS — BP 120/78 | Temp 97.6°F | Ht 71.0 in | Wt 157.0 lb

## 2010-06-08 DIAGNOSIS — E785 Hyperlipidemia, unspecified: Secondary | ICD-10-CM

## 2010-06-08 DIAGNOSIS — R1032 Left lower quadrant pain: Secondary | ICD-10-CM

## 2010-06-08 DIAGNOSIS — E7800 Pure hypercholesterolemia, unspecified: Secondary | ICD-10-CM

## 2010-06-08 DIAGNOSIS — K645 Perianal venous thrombosis: Secondary | ICD-10-CM

## 2010-06-08 DIAGNOSIS — C61 Malignant neoplasm of prostate: Secondary | ICD-10-CM

## 2010-06-08 NOTE — Patient Instructions (Signed)
It was a pleasure to see you today.  I will call you with the lipid panel results.   Release of information from Dr. Denna Haggard, [facility].

## 2010-06-08 NOTE — Discharge Summary (Signed)
NAMETILLMAN, KAZMIERSKI NO.:  [id number]   MEDICAL RECORD NO.:  [id number]          PATIENT TYPE:  OBV   LOCATION:  4736                         FACILITY:  [facility]   PHYSICIAN:  Alanson Puls, M.D.    DATE OF BIRTH:  1950-11-13   DATE OF ADMISSION:  03/25/2006  DATE OF DISCHARGE:  03/26/2006                               DISCHARGE SUMMARY   ADMISSION DIAGNOSES:  1. Atypical chest pain.  2. Asthma.  3. Hyperlipidemia.   DISCHARGE DIAGNOSES:  1. Atypical chest pain, noncardiac.  2. Asthma.  3. Hyperlipidemia.   PROCEDURE:  None.   CONSULTS:  [facility], Dr. Jacinto Halim.   HOSPITAL COURSE:  1. Atypical chest pain that was relieved by morphine and      nitroglycerin.  The patient was placed on aspirin 81 mg p.o.      q.daily, Lopressor 12.5 mg p.o. b.i.d. and Lovenox.  Cardiac enzyme      series was ordered which were negative.  We were also concerned      about GI causes of chest pain and patient was placed on Protonix      and ibuprofen.  Cardiology was consulted and patient will follow up      with [facility] on an outpatient basis.  2. Hyperlipidemia.  A fasting lipid panel was ordered and patient was      placed on Zocor 40 mg p.o. q.h.s.   DISCHARGE LABS:  Sodium 141, potassium 3.8, chloride 108, bicarb 25, BUN  11, creatinine 0.87, glucose 81, calcium 8.8, total protein 5.7, total  bilirubin 0.7, albumin 3.2, AST 25, ALT 28, alkaline phosphatase 64.  Lipid panel:  Cholesterol total 155, triglycerides 73, H L 37, LDL 103.  TSH was also ordered and that is pending.  CBC was ordered:  White blood  cell count 5.4, hemoglobin 13.3, hematocrit 38.6, platelets 234.   DISCHARGE MEDICINES:  1. Simvastatin 40 mg p.o. q.h.s.  2. Singulair 10 mg p.o. q.h.s.   FOLLOWUP APPOINTMENTS:  1. [facility] at 7:30 on March 6 for      treadmill Myoview.  2. Dr. Jacinto Halim on April 04, 2006 at 2:15.  3. Dr. Darrick Penna on April 10, 2006 at 10:00 a.m.   Patient is instructed to return sooner for chest pain, shortness of  breath or any other concerns.     ______________________________  Cassell Clement, Medical Student    ______________________________  Alanson Puls, M.D.    KT/MEDQ  D:  03/26/2006  T:  03/26/2006  Job:  [id number]

## 2010-06-08 NOTE — Assessment & Plan Note (Addendum)
Has upcoming 18 month follow up and PSA.  Reports erectile dysfunction since the surgery, mild improvement with Viagra 25mg .  May consider increasing to 50mg  at a dose, plans to discuss with Dr Laverle Patter at his upcoming visit. Denies any chest pain with physical activity.

## 2010-06-08 NOTE — Assessment & Plan Note (Signed)
Reports resolution of this problem; plans to have colonoscopy and EGD with Dr Juanda Chance on June 06.

## 2010-06-08 NOTE — Progress Notes (Signed)
  Subjective:    Patient ID: Dustin Burton, male    DOB: Mar 16, 1950, 60 y.o.   MRN: [id number]  HPI Dustin Burton comes in today for follow up on a couple of items.  Recently with thrombosed hemorrhoid and LLQ abdominal pain, which he noted to be heavily related to stress.  His niece recently died May 05, 2022), she had cystic fibrosis and was hospitalized with pneumonia and cardiovascular complications while in ICU.  Has created a great deal of stress in his life.  The abdominal pain and thrombosed hemorrhoid have improved substantially.  He has a GI workup with Dr Juanda Chance scheduled for June 6th (EGD and colonoscopy).    He reports a recurrence of the chest-upper abdominal pain that he had in the hospital last December; recurrence was 2 weeks ago, and he recalls eating a large number of ripe strawberries in the day before onset.  Was a sharp pain in epigastrium that progressed up to retrosternal area.  Resolved spontaneously, was similar in character to the pain for which he was hospitalized 5 months ago.  None since.    Saw Dermatology (Dr. Denna Haggard, [facility]) for a skin check.  Was told that he did not have rosacea but rather another condition that responded to a new topical steroid preparation.  He uses this only as-needed for short periods.   Has been limiting his bicycle riding this spring due to the hemorrhoidal issues and abdominal pain.  Getting back on bike.    Has follow up with Dr Laverle Patter coming up for 50-month check.    Review of Systems     Objective:   Physical Exam Well appearing, no apparent distress.  Facial skin without erythema.  Moist mucus membranes.  Neck supple, no cervical adenopathy COR RRR, no extra sounds PULM Clear bilaterally. No rales or wheezes.  ABD Soft, nontender, nondistended. No masses appreciated.  LE no edema bilaterally.        Assessment & Plan:

## 2010-06-08 NOTE — Assessment & Plan Note (Signed)
Continues to take simvastatin for this; will check a full lipid profile today.  I don't expect to have to make medication changes given his previously well controlled triglycerides, LDL and normal HDL.

## 2010-06-08 NOTE — Assessment & Plan Note (Signed)
Largely resolved as stress levels have reduced.  Workup with GI planned for June 6th (colonoscopy and EGD).

## 2010-06-09 LAB — LIPID PANEL
LDL Cholesterol: 94 mg/dL (ref 0–99)
VLDL: 10 mg/dL (ref 0–40)

## 2010-06-11 ENCOUNTER — Encounter: Payer: Self-pay | Admitting: Family Medicine

## 2010-06-26 ENCOUNTER — Ambulatory Visit (AMBULATORY_SURGERY_CENTER): Payer: 59 | Admitting: Internal Medicine

## 2010-06-26 ENCOUNTER — Telehealth: Payer: Self-pay | Admitting: Internal Medicine

## 2010-06-26 ENCOUNTER — Telehealth: Payer: Self-pay | Admitting: *Deleted

## 2010-06-26 ENCOUNTER — Encounter: Payer: Self-pay | Admitting: Internal Medicine

## 2010-06-26 DIAGNOSIS — Z86010 Personal history of colonic polyps: Secondary | ICD-10-CM

## 2010-06-26 DIAGNOSIS — Z800 Family history of malignant neoplasm of digestive organs: Secondary | ICD-10-CM

## 2010-06-26 DIAGNOSIS — R079 Chest pain, unspecified: Secondary | ICD-10-CM

## 2010-06-26 DIAGNOSIS — R1032 Left lower quadrant pain: Secondary | ICD-10-CM

## 2010-06-26 DIAGNOSIS — K5289 Other specified noninfective gastroenteritis and colitis: Secondary | ICD-10-CM

## 2010-06-26 DIAGNOSIS — K219 Gastro-esophageal reflux disease without esophagitis: Secondary | ICD-10-CM

## 2010-06-26 DIAGNOSIS — R933 Abnormal findings on diagnostic imaging of other parts of digestive tract: Secondary | ICD-10-CM

## 2010-06-26 MED ORDER — SODIUM CHLORIDE 0.9 % IV SOLN: 500.0000 mL | INTRAVENOUS | Status: DC

## 2010-06-26 NOTE — Telephone Encounter (Signed)
Pt states started second dose of prep this am. Has been drinking it for past hour and is having a hard time keeping it down.  Has only gotten through half of this am dose.  Stools are watery but brown.  I spoke with Dr. Juanda Chance and she instructed to wait an hour and restart prep. If unable to keep down do fleets enema before coming in at one. Pt verbalizes understanding.  Instructed to call back if any further questions or problems.

## 2010-06-26 NOTE — Patient Instructions (Signed)
Discharge instructions given with verbal understanding. Resume previous medications.  

## 2010-06-27 ENCOUNTER — Telehealth: Payer: Self-pay | Admitting: *Deleted

## 2010-06-27 NOTE — Telephone Encounter (Signed)

## 2010-07-02 ENCOUNTER — Encounter: Payer: Self-pay | Admitting: Internal Medicine

## 2010-07-03 ENCOUNTER — Encounter: Payer: Self-pay | Admitting: *Deleted

## 2010-07-03 ENCOUNTER — Telehealth: Payer: Self-pay | Admitting: *Deleted

## 2010-07-03 MED ORDER — BUDESONIDE 3 MG PO CP24: ORAL_CAPSULE | ORAL | Status: DC

## 2010-07-03 NOTE — Telephone Encounter (Signed)
Message copied by Daphine Deutscher on Tue Jul 03, 2010  8:59 AM ------      Message from: Hart Carwin      Created: Mon Jul 02, 2010  1:24 PM       I have spoken to the pt regarding his diagnosis of lymphocytic colitis. Please send to his Pharmacy Entecort 3mg , #90, 3 po qd ( 9 mg) x4 weeks, 6mg  po qd x 4 weeks, 3 mg po qd x 4 weeks. Please call with status update in 2 weeks. Please make appointment first available.

## 2010-07-03 NOTE — Telephone Encounter (Signed)
Spoke with patient. Rx sent to pharmacy, Patient will call in 2 weeks with update. Appointment scheduled on 08/02/10 at 8:45 AM. Letter mailed also.

## 2010-07-04 ENCOUNTER — Encounter: Payer: Self-pay | Admitting: Internal Medicine

## 2010-07-23 ENCOUNTER — Telehealth: Payer: Self-pay | Admitting: *Deleted

## 2010-07-23 NOTE — Telephone Encounter (Signed)
Called to follow up on how patient is doing. He states he is doing better. Now taking Entocort 2 tabs/daily.  He will keep the appointment with Dr. Juanda Chance on 08/02/10.

## 2010-07-23 NOTE — Telephone Encounter (Signed)
Message copied by Daphine Deutscher on Mon Jul 23, 2010  2:45 PM ------      Message from: Daphine Deutscher      Created: Tue Jul 03, 2010  9:09 AM       Did patient call with 2 week f/u report?(DB)

## 2010-08-02 ENCOUNTER — Telehealth: Payer: Self-pay | Admitting: Family Medicine

## 2010-08-02 ENCOUNTER — Ambulatory Visit (INDEPENDENT_AMBULATORY_CARE_PROVIDER_SITE_OTHER): Payer: 59 | Admitting: Internal Medicine

## 2010-08-02 ENCOUNTER — Encounter: Payer: Self-pay | Admitting: Internal Medicine

## 2010-08-02 VITALS — BP 116/68 | HR 72 | Ht 71.0 in | Wt 152.2 lb

## 2010-08-02 DIAGNOSIS — E7800 Pure hypercholesterolemia, unspecified: Secondary | ICD-10-CM

## 2010-08-02 DIAGNOSIS — K5289 Other specified noninfective gastroenteritis and colitis: Secondary | ICD-10-CM

## 2010-08-02 MED ORDER — ATORVASTATIN CALCIUM 20 MG PO TABS: 20.0000 mg | ORAL_TABLET | Freq: Every day | ORAL | Status: DC

## 2010-08-02 NOTE — Patient Instructions (Addendum)
CC: Dr Paula Compton

## 2010-08-02 NOTE — Progress Notes (Signed)
Dustin Burton [birth year] MRN [id number]      History of Present Illness:  This is a 60 year old white male with a new diagnosis of microscopic colitis. It is possibly related to the Zocor. He has a positive family history of colon cancer in his father. His most recent colonoscopy in June 2012 was essentially normal but random biopsies showed lymphocytic colitis. He dramatically improved on Entecort 9 mg a day for 3 weeks. He is currently on 6 mg a day and has been for the past 2 weeks. His bowel habits have normalized to having a solid bowel movement every day. He denies cramping, abdominal pain or bleeding.    Past Medical History  Diagnosis Date  . Cancer     PROSTATE  . Hemorrhoids   . Brachial plexitis   . Elevated LFTs 07/2001  . IBS (irritable bowel syndrome)    Past Surgical History  Procedure Date  . Prostatectomy   . Knee surgery     right  . Hand surgery     left index finger    reports that he has never smoked. He has never used smokeless tobacco. He reports that he drinks alcohol. He reports that he does not use illicit drugs. family history includes Colon cancer in his father; Coronary artery disease in his mother; Crohn's disease in his father and mother; Cystic fibrosis in an unspecified family member; and Hypertension in his father and mother. Allergies  Allergen Reactions  . Tadalafil Other (See Comments)    Muscular leg pain        Review of Systems:  The remainder of the 10  point ROS is negative except as outlined in H&P    Assessment and Plan:  Problem #1 Lymphocytic colitis likely related to Zocor. I would like him to discontinue Zocor and ask Dr. Mauricio Po to consider an alternative treatment of his hyperlipidemia. He will remain on steroids for the next 2 weeks and taper gradually to 1 tablet a day for 4 weeks then every other day for 4 weeks and then discontinue. If symptoms return, he will likely need another course of steroids. Alternatively, we will  consider immunomodulators such as 6-MP.  Problem #2 Patient has a positive family history of colorectal cancer in his father. A recall colonoscopy will be due in June 2016.  08/02/2010 Dustin Burton

## 2010-08-02 NOTE — Telephone Encounter (Signed)
Went to Dr Dickie La this morning for f/u his colonoscopy.  She told him that he has microscopic colitis and told him that he needed to d/c Zocor.  He needs to know what to do to replace it.

## 2010-08-02 NOTE — Telephone Encounter (Signed)
Will fwd. To Dr.Breen for review. .Charlsey Moragne   

## 2010-08-02 NOTE — Telephone Encounter (Signed)
Called patient back regarding simvastatin and microscopic colitis.  Simvastatin is the only offending statin for this condition. Previously used atorvastatin without side effects.  To change back to atorvastatin and discontinue simva.  Recheck d-LDL in 2 months as a future-order.

## 2010-08-07 ENCOUNTER — Ambulatory Visit (INDEPENDENT_AMBULATORY_CARE_PROVIDER_SITE_OTHER): Payer: 59 | Admitting: Family Medicine

## 2010-08-07 ENCOUNTER — Encounter: Payer: Self-pay | Admitting: Family Medicine

## 2010-08-07 VITALS — BP 130/81 | HR 67 | Temp 98.6°F | Ht 71.0 in | Wt 154.6 lb

## 2010-08-07 DIAGNOSIS — R300 Dysuria: Secondary | ICD-10-CM

## 2010-08-07 LAB — POCT URINALYSIS DIPSTICK
Ketones, UA: NEGATIVE
Protein, UA: NEGATIVE
Spec Grav, UA: <=1.005
Urobilinogen, UA: 0.2
pH, UA: 5.5

## 2010-08-07 LAB — POCT UA - MICROSCOPIC ONLY

## 2010-08-07 MED ORDER — SULFAMETHOXAZOLE-TRIMETHOPRIM 800-160 MG PO TABS: 1.0000 | ORAL_TABLET | Freq: Two times a day (BID) | ORAL | Status: DC

## 2010-08-07 NOTE — Patient Instructions (Signed)
Will await results of urine culture Call if fever, chills, abdominal pain

## 2010-08-07 NOTE — Progress Notes (Signed)
  Subjective:    Patient ID: Dustin Burton, male    DOB: [birth year], 60 y.o.   MRN: [id number]  HPI DYSURIA  Onset: 1 week   Worsening: yes    Symptoms Urgency: no  Frequency: yes  Hesitancy: no  Hematuria: no  Flank Pain: no  Fever: no   Highest Temp:   Nausea/Vomiting: no  Pregnant: no STD exposure/history: no, has not been sexually active in 1-2 years, no concern for STD Discharge: no Irritants: no Rash: no  Red Flags  : (Risk Factors for Complicated UTI) Recent Antibiotic Usage (last 30 days): no  Symptoms lasting more than seven (7) days: no  More than 3 UTI's last 12 months: no, but see HPI below   PMH of  1. DM: no 2. Renal Disease/Calculi: no 3. Urinary Tract Abnormality: yes, prostatectomy in 2010  4. Instrumentation/Trauma: no, not recently 5. Immunosuppression: yes on steroids for "microcolitis" for past 5 weeks.  Currently on budesonide 3 mg daily   Prostatectomy in Dec 2010.  Has had 3 urinary tract infections since that time  March 2011: First was confirmed with E. Coli in culture, pan-sensetive Late March 2011: resolved then recurrence, tx with levaquin, culture negative Jan 2012:  tx empirically with cipro x 10 days.  No culture performed.  Current symptoms urinary frequency, mild discomfort immediately after urination.  Has chronic mild testicular discomfort due to varicocele per patient.  No change.  Rides bike.          Review of Systems ROS + for fatigue (thinks may be due to hot weather) see HPI     Objective:   Physical Exam GEN: Alert & Oriented, No acute distress CV:  Regular Rate & Rhythm, no murmur Respiratory:  Normal work of breathing, CTAB Abd:  + BS, soft, no tenderness to palpation.  No flank pain Ext: no pre-tibial edema        Assessment & Plan:

## 2010-08-07 NOTE — Assessment & Plan Note (Signed)
Symptom of dysuria without objective evidence of UTI.  Complicated history of total prostatectomy and recent immunosuppression with oral steroids.  No systemic illness.    Ddx to include cystitis, less likely epidermitis, no longer has prostate (prostatitis).  We discuss and there is minimal possibility of chlamydia; or gonorrhea infection and no external lesion per patient.  Given patient history and response to abx and current immunosuppression, will treat empirically E. Coli which is > 80% sens to bactrim x 7 days.  Will await culture.  If does not improve, asked him to come back for urethral culture and full genital exam.   In any case, asked him to discuss with urology even if asymptomatic.

## 2010-08-09 ENCOUNTER — Encounter: Payer: Self-pay | Admitting: Family Medicine

## 2010-10-05 ENCOUNTER — Other Ambulatory Visit: Payer: Self-pay | Admitting: Family Medicine

## 2010-10-05 ENCOUNTER — Telehealth: Payer: Self-pay | Admitting: Family Medicine

## 2010-10-05 NOTE — Telephone Encounter (Signed)
Pt wants to shingles vaccine and wants to know what to do

## 2010-10-05 NOTE — Telephone Encounter (Signed)
Refill request

## 2010-10-08 NOTE — Telephone Encounter (Signed)
Attempted to call patient.  No answer.  Left message to call us back.

## 2010-10-08 NOTE — Telephone Encounter (Signed)
Patient called back and said he wanted to think about it.

## 2010-10-18 LAB — POCT HEMOGLOBIN-HEMACUE: Hemoglobin: 14.5

## 2010-11-05 ENCOUNTER — Ambulatory Visit: Payer: 59

## 2010-11-05 ENCOUNTER — Ambulatory Visit (INDEPENDENT_AMBULATORY_CARE_PROVIDER_SITE_OTHER): Payer: 59 | Admitting: *Deleted

## 2010-11-05 DIAGNOSIS — Z23 Encounter for immunization: Secondary | ICD-10-CM

## 2010-11-08 NOTE — Telephone Encounter (Signed)
Question answered by admitting nurse

## 2010-12-11 ENCOUNTER — Emergency Department (HOSPITAL_BASED_OUTPATIENT_CLINIC_OR_DEPARTMENT_OTHER)
Admission: EM | Admit: 2010-12-11 | Discharge: 2010-12-11 | Disposition: A | Discharge status: Home / Self Care | Payer: 59 | Attending: Emergency Medicine | Admitting: Emergency Medicine

## 2010-12-11 ENCOUNTER — Encounter (HOSPITAL_BASED_OUTPATIENT_CLINIC_OR_DEPARTMENT_OTHER): Payer: Self-pay | Admitting: *Deleted

## 2010-12-11 DIAGNOSIS — S61311A Laceration without foreign body of left index finger with damage to nail, initial encounter: Secondary | ICD-10-CM

## 2010-12-11 DIAGNOSIS — Y93G1 Activity, food preparation and clean up: Secondary | ICD-10-CM | POA: Insufficient documentation

## 2010-12-11 DIAGNOSIS — S61209A Unspecified open wound of unspecified finger without damage to nail, initial encounter: Secondary | ICD-10-CM | POA: Insufficient documentation

## 2010-12-11 DIAGNOSIS — Z79899 Other long term (current) drug therapy: Secondary | ICD-10-CM | POA: Insufficient documentation

## 2010-12-11 DIAGNOSIS — K589 Irritable bowel syndrome without diarrhea: Secondary | ICD-10-CM | POA: Insufficient documentation

## 2010-12-11 DIAGNOSIS — W268XXA Contact with other sharp object(s), not elsewhere classified, initial encounter: Secondary | ICD-10-CM | POA: Insufficient documentation

## 2010-12-11 MED ORDER — LIDOCAINE HCL 2 % IJ SOLN
INTRAMUSCULAR | Status: AC
  Filled 2010-12-11: qty 1

## 2010-12-11 NOTE — ED Provider Notes (Signed)
History     CSN: [id number] Arrival date & time: 12/11/2010  5:19 PM   First MD Initiated Contact with Patient 12/11/10 1703      Chief Complaint  Patient presents with  . Extremity Laceration    (Consider location/radiation/quality/duration/timing/severity/associated sxs/prior treatment) HPI Patient complains of laceration to left ring fingertip while cutting sweet potatoes on a mandoin.  Reports some bleeding and pain.  Tetanus up to date.  Symptoms worsened by palpation and movement.  No weakness of his finger.  No numbness.. the symptoms are constant  Past Medical History  Diagnosis Date  . Cancer     PROSTATE  . Hemorrhoids   . Brachial plexitis   . Elevated LFTs 07/2001  . IBS (irritable bowel syndrome)     Past Surgical History  Procedure Date  . Prostatectomy   . Knee surgery     right  . Hand surgery     left index finger    Family History  Problem Relation Age of Onset  . Hypertension Mother   . Hypertension Father   . Colon cancer Father   . Coronary artery disease Mother   . Crohn's disease Mother   . Crohn's disease Father   . Cystic fibrosis      niece    History  Substance Use Topics  . Smoking status: Never Smoker   . Smokeless tobacco: Never Used  . Alcohol Use: Yes     socially      Review of Systems  All other systems reviewed and are negative.    Allergies  Tadalafil  Home Medications   Current Outpatient Rx  Name Route Sig Dispense Refill  . ATORVASTATIN CALCIUM 20 MG PO TABS Oral Take 1 tablet (20 mg total) by mouth daily. 30 tablet 11  . BUDESONIDE 3 MG PO CP24  Take 3 tabs(9mg ) po every day x 4 weeks, then 2 tabs(6mg ) po every day x 4 weeks, then 1 tab(3mg ) po every day x 4 weeks 90 capsule 3  . ONE-DAILY MULTI VITAMINS PO TABS Oral Take 1 tablet by mouth daily.      [name] PROBIOTIC FORMULA PO Oral Take 1 capsule by mouth daily.      [name] SILDENAFIL CITRATE 25 MG PO TABS Oral Take 25 mg by mouth daily as needed.      [name]  SINGULAIR 10 MG PO TABS  TAKE 1 TABLET EVERY DAY 90 tablet 3    PATIENT WOULD LIKE REFILLS PLEASE  . VENTOLIN HFA 108 (90 BASE) MCG/ACT IN AERS  INHALE 2 PUFFS EVERY 4 TO 6 HOURS AS NEEDED FOR WHEEZING 1 Inhaler 0  . KETOCONAZOLE 2 % EX CREA Topical Apply topically as needed.        BP 139/85  Pulse 62  Temp(Src) 98.4 F (36.9 C) (Oral)  Resp 16  Ht 5\' 11"  (1.803 m)  Wt 155 lb (70.308 kg)  BMI 21.62 kg/m2  SpO2 98%  Physical Exam  Constitutional: He is oriented to person, place, and time. He appears well-developed and well-nourished.  HENT:  Head: Normocephalic.  Eyes: EOM are normal.  Neck: Normal range of motion.  Pulmonary/Chest: Effort normal.  Musculoskeletal: Normal range of motion.       Left index finger with laceration of tip of the ring finger.  There is no bony involvement.  He did lacerate a small portion of the distal fingernail without underlying nail bed injury.  There is minimal bleeding at this time.  His normal flexion and  extension of his left ring finger  Neurological: He is alert and oriented to person, place, and time.  Psychiatric: He has a normal mood and affect.    ED Course  NERVE BLOCK Performed by: Lyanne Co Authorized by: Lyanne Co Consent: Verbal consent obtained. Consent given by: patient Patient identity confirmed: provided demographic data Time out: Immediately prior to procedure a "time out" was called to verify the correct patient, procedure, equipment, support staff and site/side marked as required. Indications comments: laceration Body area: upper extremity Nerve: digital Laterality: left (ring finger) Preparation: Patient was prepped and draped in the usual sterile fashion. Needle gauge: 25 G Location technique: anatomical landmarks Local anesthetic: lidocaine 2% without epinephrine Anesthetic total: 4 ml Outcome: pain improved Patient tolerance: Patient tolerated the procedure well with no immediate  complications.    LACERATION REPAIR Performed by: Lyanne Co Consent: Verbal consent obtained. Risks and benefits: risks, benefits and alternatives were discussed Patient identity confirmed: provided demographic data Time out performed prior to procedure Prepped and Draped in normal sterile fashion Wound explored  Laceration Location: left index finger tip  Laceration Length: 2cm  No Foreign Bodies seen or palpated  Anesthesia: Nerve Block ( lidocaine 2% without epinephrine) Anesthetic total: 4 ml  Irrigation method: syringe Amount of cleaning: standard  Skin closure: simple  Number of sutures or staples: 5  Technique: simple interrupted  Patient tolerance: Patient tolerated the procedure well with no immediate complications.   Labs Reviewed - No data to display No results found.   1. Laceration of left index finger without foreign body with damage to nail       MDM  Laceration repaired.  Infection warnings given.  PCP to provide suture removal.  Tetanus up to date      Lyanne Co, MD 12/11/10 [phone]

## 2010-12-11 NOTE — ED Notes (Signed)
Pt c/o laceration to right ring finger x 1 hr ago

## 2010-12-21 ENCOUNTER — Ambulatory Visit (INDEPENDENT_AMBULATORY_CARE_PROVIDER_SITE_OTHER): Payer: 59 | Admitting: *Deleted

## 2010-12-21 DIAGNOSIS — Z4802 Encounter for removal of sutures: Secondary | ICD-10-CM

## 2010-12-21 NOTE — Progress Notes (Signed)
In for suture removal , right ring finger .  Sutures were placed 10 days ago. 5 sutures removed without difficulty. Wound has healed well, no signs of infection.

## 2011-02-08 ENCOUNTER — Ambulatory Visit: Payer: 59 | Admitting: Family Medicine

## 2011-02-19 ENCOUNTER — Ambulatory Visit (INDEPENDENT_AMBULATORY_CARE_PROVIDER_SITE_OTHER): Payer: 59 | Admitting: Family Medicine

## 2011-02-19 ENCOUNTER — Encounter: Payer: Self-pay | Admitting: Family Medicine

## 2011-02-19 DIAGNOSIS — B079 Viral wart, unspecified: Secondary | ICD-10-CM

## 2011-02-19 DIAGNOSIS — L2089 Other atopic dermatitis: Secondary | ICD-10-CM

## 2011-02-19 MED ORDER — MOMETASONE FUROATE 0.1 % EX SOLN: Freq: Every day | CUTANEOUS | Status: AC

## 2011-02-19 MED ORDER — SCOPOLAMINE 1 MG/3DAYS TD PT72: 1.0000 | MEDICATED_PATCH | TRANSDERMAL | Status: DC

## 2011-02-19 MED ORDER — IMIQUIMOD 5 % EX CREA: TOPICAL_CREAM | CUTANEOUS | Status: DC

## 2011-02-19 NOTE — Patient Instructions (Signed)
It was a pleasure to see you today.  I shaved off the top of the wart, and applied liquid nitrogen to the remaining base. You will likely have a blister form around the area;  If not improved or if returns, then please fill the prescription for Aldara (5%) and apply to wart three times daily, leave on for 6 to 10 hours and then rinse off.  May do this for up to 16 weeks.  The prescription is at the [facility].   For the underarm rash (and trunk), consider detergents/other changes in hygiene products.  Mometasone 0.1% lotion daily for up to 2 to 4 weeks  Scopolamine for motion sickness on the cruise.

## 2011-02-20 DIAGNOSIS — B079 Viral wart, unspecified: Secondary | ICD-10-CM | POA: Insufficient documentation

## 2011-02-20 NOTE — Progress Notes (Signed)
  Subjective:    Patient ID: Dustin Burton, male    DOB: 1950/11/17, 61 y.o.   MRN: [id number]  HPI Patient here with complaint of skin mass on L side of scrotum; irritated by long bike rides on weekends, uses HC 2.5% cream and this provides some temporary relief.  Not noticed it growing in size.   Reports irritation in axillae; has stopped his deodorant for 1 month.  In reviewing possible triggers, recalls recent change in laundry detergent to ALL at home.  Prior history of condyloma many years ago.   S/p prostatectomy for prostate cancer, reports PSA levels undetectable with biannual checks at urology.    Review of Systems     Objective:   Physical Exam Well appearing, no apparent distress. Scrotal skin with 4mm diameter raised skin lesion, not hyperpigmented.  Flattened pedunculation on top. No erythema surrounding lesion.  Not friable.  SKIN: Axillary areas and trunk with blanchable redness diffusely.   Procedure Note: After discussing risk/benefits and potential adverse effects, patient gives consent for shave removal of pedunculated portion, and treatment of base with liquid nitrogen.  He gives verbal consent for this. Area prepped with alcohol; dermablade used to shave flattened portion of lesion with good effect and no bleeding. Cryo gun used with inverted ear speculum to administer liquid nitrogen to the base of the lesion.  Well tolerated.        Assessment & Plan:

## 2011-02-20 NOTE — Assessment & Plan Note (Signed)
Truncal, likely secondary to recent detergent change.  To apply mometasone lotion to trunk; change detergent.  Follow up as needed.

## 2011-02-20 NOTE — Assessment & Plan Note (Signed)
See Overview

## 2011-03-28 ENCOUNTER — Ambulatory Visit: Payer: 59

## 2011-03-28 ENCOUNTER — Ambulatory Visit (INDEPENDENT_AMBULATORY_CARE_PROVIDER_SITE_OTHER): Payer: 59 | Admitting: Family Medicine

## 2011-03-28 ENCOUNTER — Encounter: Payer: Self-pay | Admitting: Family Medicine

## 2011-03-28 VITALS — BP 136/82 | HR 74 | Temp 98.1°F | Ht 71.0 in | Wt 159.0 lb

## 2011-03-28 DIAGNOSIS — J329 Chronic sinusitis, unspecified: Secondary | ICD-10-CM | POA: Insufficient documentation

## 2011-03-28 MED ORDER — AMOXICILLIN 500 MG PO CAPS: 500.0000 mg | ORAL_CAPSULE | Freq: Two times a day (BID) | ORAL | Status: DC

## 2011-03-28 NOTE — Progress Notes (Signed)
  Subjective:    Patient ID: Dustin Burton, male    DOB: [birth year], 61 y.o.   MRN: [id number]  HPI CC; nasal drainage  10 days of cold symptoms of cough, nasal drainage, chest tightness, reflux when he lays flat at night.  Cold symptoms improved for several days about 4 days ago, then returned.  Now still having purulent green nasal discharge, low grade fevers (100 at home).    No headache, facial pain, tooth pain or dyspnea.  Has taken his peak flows and they are above the green zone.  Feels overall fatigued again.     Review of Systemssee HPI     Objective:   Physical Exam  GEN: Alert & Oriented, No acute distress HEENT: [place]/AT. EOMI, PERRLA, no conjunctival injection or scleral icterus.  Bilateral tympanic membranes intact without erythema or effusion.  .  Nares without edema or rhinorrhea.  Oropharynx is without erythema or exudates.  No anterior or posterior cervical lymphadenopathy. CV:  Regular Rate & Rhythm, no murmur Respiratory:  Normal work of breathing, CTAB        Assessment & Plan:

## 2011-03-28 NOTE — Patient Instructions (Addendum)

## 2011-03-28 NOTE — Assessment & Plan Note (Signed)
10- days in duration with second sickening, will tx for amoxicillin x 7 days.  Advised trying albuterol for tightness and he plans on restarting OTC omeprazole for reflux after dinner at night.  Advised if continued to feel chest tight or reflux symptoms in 1-2 weeks after sinusitis is resolved, to return for re-evaluation with PCP.

## 2011-06-10 ENCOUNTER — Other Ambulatory Visit: Payer: Self-pay | Admitting: Family Medicine

## 2011-08-02 ENCOUNTER — Encounter: Payer: Self-pay | Admitting: Family Medicine

## 2011-08-02 ENCOUNTER — Ambulatory Visit (INDEPENDENT_AMBULATORY_CARE_PROVIDER_SITE_OTHER): Payer: 59 | Admitting: Family Medicine

## 2011-08-02 VITALS — BP 132/78 | HR 60 | Ht 71.0 in | Wt 157.5 lb

## 2011-08-02 DIAGNOSIS — E7800 Pure hypercholesterolemia, unspecified: Secondary | ICD-10-CM

## 2011-08-02 DIAGNOSIS — J45990 Exercise induced bronchospasm: Secondary | ICD-10-CM

## 2011-08-02 DIAGNOSIS — C61 Malignant neoplasm of prostate: Secondary | ICD-10-CM

## 2011-08-02 LAB — LDL CHOLESTEROL, DIRECT: Direct LDL: 112 mg/dL — ABNORMAL HIGH

## 2011-08-02 NOTE — Assessment & Plan Note (Signed)
Followed by Dr Laverle Patter; PSA reportedly undetectable.  He is taking Viagra from Dr Laverle Patter, with good results.  Trying to titrate down on dosing.

## 2011-08-02 NOTE — Progress Notes (Signed)
  Subjective:    Patient ID: GRESHAM CAETANO, male    DOB: 06-Jan-1951, 61 y.o.   MRN: [id number]  HPI  Seen with medical student Lenise Herald, MS3 [facility]  Seen today for follow up of hypercholesterolemia.  Had been on simvastatin but changed to atorvastatin after dx microscopic colitis in 2012 (June).  Is doing well with regard to GI and bowel habits; due for another colonoscopy at 5-yr interval (done by Dr Juanda Chance in 2012).   Is riding his bicycle a lot; back from [place] where he rode in [place].  The perineal wart disappeared quickly after cryo treatment.  Never even needed to use Aldara.   Saw Dr Laverle Patter for prostate cancer follow up this morning PSA is undetectable. Is doing well.  Skin has been clear with Cetaphil cream and soap.  Not using metrogel or other topical meds at this time.  Uses nonsedating antihistamine in Spring for seasonal allergies.   Social Hx reviewed and no changes. Review of Systems No weight change; no chest pain or dyspnea, no cough. Had chickenpox earlier in life.    Objective:   Physical Exam Well appearing, no apparent distress HEENT Neck supple, no adenopathy. Clear skin on face.  COR Regular S1S2, no extra sounds PULM Clear bilaterally, No rales or wheezees ABD Soft, nontender, nondistended. No inguinal adenopathy.        Assessment & Plan:

## 2011-08-02 NOTE — Assessment & Plan Note (Signed)
Has been well controlled on statins; no muscle aches or other adverse effects.  Will plan to check direct LDL today; to refill Lipitor at current dose once LDL is back (he has enough med for 1 week).  Will call with results when lab is back.

## 2011-08-02 NOTE — Assessment & Plan Note (Signed)
Well controlled.  Seasonal antihistamines for allergic triggers.

## 2011-08-05 ENCOUNTER — Telehealth: Payer: Self-pay | Admitting: Family Medicine

## 2011-08-05 MED ORDER — ATORVASTATIN CALCIUM 20 MG PO TABS: 20.0000 mg | ORAL_TABLET | Freq: Every day | ORAL | Status: DC

## 2011-08-05 NOTE — Telephone Encounter (Signed)
Called to give results of LDL cholesterol.  Spoke with patient, will reorder in 90-day amounts to [facility].  JB

## 2011-11-05 ENCOUNTER — Ambulatory Visit (INDEPENDENT_AMBULATORY_CARE_PROVIDER_SITE_OTHER): Payer: 59 | Admitting: Sports Medicine

## 2011-11-05 ENCOUNTER — Encounter: Payer: Self-pay | Admitting: Sports Medicine

## 2011-11-05 VITALS — BP 148/88 | HR 62 | Ht 71.0 in | Wt 150.0 lb

## 2011-11-05 DIAGNOSIS — IMO0002 Reserved for concepts with insufficient information to code with codable children: Secondary | ICD-10-CM

## 2011-11-05 DIAGNOSIS — M25569 Pain in unspecified knee: Secondary | ICD-10-CM

## 2011-11-05 DIAGNOSIS — S76319A Strain of muscle, fascia and tendon of the posterior muscle group at thigh level, unspecified thigh, initial encounter: Secondary | ICD-10-CM

## 2011-11-05 NOTE — Progress Notes (Signed)
  Subjective:    Patient ID: Dustin Burton, male    DOB: 1950/06/20, 61 y.o.   MRN: [id number]  HPI chief complaint: Right knee pain Very pleasant 61 year old male comes in today complaining of 1 month of right knee pain. Pain began at the end of a 100 mile bike race. His break pad got stuck up against his wheel causing tremendous resistance for him to pedal against for the last 20 miles. Pain began immediately after that race. He localizes it to the posterior lateral aspect of the knee but at times it will radiate and involve the entire knee. He has not noticed any swelling. No locking catching or popping. He has a history of bilateral meniscectomies done in 2008 in 2009. His were done by Dr. Thurston Hole. His current symptoms are different in nature than what he experienced with his meniscal injuries. For the past 4 weeks he has noticed his pain to be most noticeable with any sort of resistance on the bike. Also some pain with coming down stairs. No pain at rest.    Review of Systems     Objective:   Physical Exam Well-developed, well-nourished. No acute distress. Awake alert and oriented x3. Vital signs are reviewed  Right knee: Full range of motion without an effusion. 1+ patellofemoral crepitus. He is tender to palpation along the distal biceps femoris tendon proximal to its insertion on the fibula. He has slight pain with resisted hamstring flexion, but strength is 5/5. There is no palpable defect. No soft tissue swelling. No ecchymosis. The knee is stable to valgus and varus stressing. Negative anterior drawer. Negative posterior drawer. No tenderness to palpation along the medial or lateral joint lines. Negative McMurray's. Negative Thessalys. Neurovascularly intact distally. Walks without an obvious limp.  MSK ultrasound right knee: Limited ultrasound of the lateral aspect of the knee was performed. Lateral meniscus appears to be intact. There is some hypoechogenicity as well as increased Doppler  flow at the biceps femoris tendon. No discrete tear.       Assessment & Plan:  1. Right knee pain secondary to distal hamstring tendon strain 2. Status post bilateral knee arthroscopies for meniscectomy, 2008 and 2009.  Body helix compression sleeve to wear with activity. Patient can continue to exercise, specifically he can continue with biking but with minimal resistance. Comprehensive eccentric home exercise program. Over-the-counter analgesics when necessary. Followup in 4 weeks for recheck. Call with questions or concerns in the interim.

## 2011-11-11 ENCOUNTER — Emergency Department (HOSPITAL_BASED_OUTPATIENT_CLINIC_OR_DEPARTMENT_OTHER): Payer: 59

## 2011-11-11 ENCOUNTER — Emergency Department (HOSPITAL_BASED_OUTPATIENT_CLINIC_OR_DEPARTMENT_OTHER)
Admission: EM | Admit: 2011-11-11 | Discharge: 2011-11-11 | Disposition: A | Discharge status: Home / Self Care | Payer: 59 | Attending: Emergency Medicine | Admitting: Emergency Medicine

## 2011-11-11 ENCOUNTER — Encounter (HOSPITAL_BASED_OUTPATIENT_CLINIC_OR_DEPARTMENT_OTHER): Payer: Self-pay | Admitting: *Deleted

## 2011-11-11 DIAGNOSIS — Z8546 Personal history of malignant neoplasm of prostate: Secondary | ICD-10-CM | POA: Insufficient documentation

## 2011-11-11 DIAGNOSIS — Z79899 Other long term (current) drug therapy: Secondary | ICD-10-CM | POA: Insufficient documentation

## 2011-11-11 DIAGNOSIS — H538 Other visual disturbances: Secondary | ICD-10-CM | POA: Insufficient documentation

## 2011-11-11 DIAGNOSIS — K589 Irritable bowel syndrome without diarrhea: Secondary | ICD-10-CM | POA: Insufficient documentation

## 2011-11-11 NOTE — ED Notes (Signed)
Pt ambulates with a steady gait, unassisted. Pt denies any symptoms other than blurred vision in the right eye and pain of a 3/10 around his right eye socket.

## 2011-11-11 NOTE — ED Provider Notes (Signed)
History   This chart was scribed for Carleene Cooper III, MD by Sofie Rower. The patient was seen in room MH04/MH04 and the patient's care was started at 7:04PM.     CSN: [id number]  Arrival date & time 11/11/11  1850   First MD Initiated Contact with Patient 11/11/11 1904      Chief Complaint  Patient presents with  . unequal pupils after sudden headache     (Consider location/radiation/quality/duration/timing/severity/associated sxs/prior treatment) Patient is a 61 y.o. male presenting with eye problem. The history is provided by the patient. No language interpreter was used.  Eye Problem  This is a new problem. The current episode started 3 to 5 hours ago. The problem occurs constantly. The problem has not changed since onset.There is pain in the right eye. There was no injury mechanism. The pain is at a severity of 4/10. The pain is moderate. There is no history of trauma to the eye. There is no known exposure to pink eye. He does not wear contacts. Associated symptoms include blurred vision. Pertinent negatives include no foreign body sensation and no photophobia. He has tried nothing for the symptoms. The treatment provided no relief.    MASTON WIGHT is a 61 y.o. male , with a hx of sinus headache, who presents to the Emergency Department complaining of sudden, progressively worsening, eye problem, located at the right eye, onset today (4:30PM). Associated symptoms include blurred vision located at the right eye and headache. The pt reports he experienced a sharp pain behind his right eye at 4:30PM this afternoon. Shortly thereafter, the pt looked in a mirror and noticed his right pupil was dilated much more so than that of the left pupil. The pt describes his headache as a mild, generalized, pressurized feeling located at the front of his head. The pt rates the pain he is experiencing at a 4/10 at present. The pt has a hx of prostatectomy (performed 2 years ago), high cholesterol, seasonal  allergies, and tendonitis located at the right hamstring.   The pt denies any injury mechanism with regard to his right eye problem.   The pt does not smoke, however, he does drink alcohol socially.   PCP is Dr. Mauricio Po.    Past Medical History  Diagnosis Date  . Cancer     PROSTATE  . Hemorrhoids   . Brachial plexitis   . Elevated LFTs 07/2001  . IBS (irritable bowel syndrome)     Past Surgical History  Procedure Date  . Prostatectomy   . Knee surgery     right  . Hand surgery     left index finger    Family History  Problem Relation Age of Onset  . Hypertension Mother   . Hypertension Father   . Colon cancer Father   . Coronary artery disease Mother   . Crohn's disease Mother   . Crohn's disease Father   . Cystic fibrosis      niece    History  Substance Use Topics  . Smoking status: Never Smoker   . Smokeless tobacco: Never Used  . Alcohol Use: Yes     socially      Review of Systems  Eyes: Positive for blurred vision. Negative for photophobia.  All other systems reviewed and are negative.    Allergies  Tadalafil  Home Medications   Current Outpatient Rx  Name Route Sig Dispense Refill  . ATORVASTATIN CALCIUM 20 MG PO TABS Oral Take 1 tablet (20 mg total)  by mouth daily. 90 tablet 3  . MOMETASONE FUROATE 0.1 % EX SOLN Topical Apply topically daily. 60 mL 1  . ONE-DAILY MULTI VITAMINS PO TABS Oral Take 1 tablet by mouth daily.      [name] PROBIOTIC FORMULA PO Oral Take 1 capsule by mouth daily.      [name] SILDENAFIL CITRATE 25 MG PO TABS Oral Take 25 mg by mouth daily as needed.      [name] SINGULAIR 10 MG PO TABS  TAKE 1 TABLET EVERY DAY 90 tablet 0  . VENTOLIN HFA 108 (90 BASE) MCG/ACT IN AERS  INHALE 2 PUFFS EVERY 4 TO 6 HOURS AS NEEDED FOR WHEEZING 1 Inhaler 0    BP 164/101  Pulse 70  Temp 98.2 F (36.8 C) (Oral)  Resp 20  Ht 5\' 11"  (1.803 m)  Wt 153 lb (69.4 kg)  BMI 21.34 kg/m2  SpO2 100%  Physical Exam  Nursing note and vitals  reviewed. Constitutional: He is oriented to person, place, and time. He appears well-developed and well-nourished.  HENT:  Head: Atraumatic.  Right Ear: Tympanic membrane normal.  Left Ear: Tympanic membrane normal.  Nose: Nose normal.  Eyes: Right eye exhibits no discharge. Left eye exhibits no discharge.       Right pupil 4 mm, left pupil 2 mm.  Neck: Normal range of motion. Neck supple.  Cardiovascular: Normal rate, regular rhythm and normal heart sounds.   Pulmonary/Chest: Effort normal and breath sounds normal.  Abdominal: Soft. Bowel sounds are normal.  Musculoskeletal: Normal range of motion.  Neurological: He is alert and oriented to person, place, and time. No cranial nerve deficit.  Skin: Skin is warm and dry.  Psychiatric: He has a normal mood and affect. His behavior is normal.    ED Course  Procedures (including critical care time)  DIAGNOSTIC STUDIES: Oxygen Saturation is 100% on room air, normal by my interpretation.    COORDINATION OF CARE:     7:17 PM- Treatment plan concerning evaluation of vision discussed with patient. Pt agrees with treatment.   8:21 PM-  Recheck. Pt reports he still has a slight headache. Re-exam: Right pupil 3 mm, left pupil 2 mm. Treatment plan concerning x-ray results and follow up with Opthomologist discussed with patient. Pt agrees with treatment.    8:56 PM-  Recheck. Treatment plan discussed with patient. Pt agrees with treatment.          Labs Reviewed - No data to display  Ct Head Wo Contrast  11/11/2011  *RADIOLOGY REPORT*  Clinical Data: Right eye pain and frontal headache.  Right pupil dilated.  Sudden onset of headache.  Hypertension.  CT HEAD WITHOUT CONTRAST  Technique:  Contiguous axial images were obtained from the base of the skull through the vertex without contrast.  Comparison: 01/15/2010  Findings: There is no intra or extra-axial fluid collection or mass lesion.  The basilar cisterns and ventricles have a  normal appearance.  There is no CT evidence for acute infarction or hemorrhage.  Bone windows show no acute findings.  IMPRESSION: Negative exam.   Original Report Authenticated By: Patterson Hammersmith, M.D.    9:01 PM Case discussed with Dr. Clarisa Kindred, ophthalmologist on call.  She advised that pt could be seen tomorrow morning in her office.  She did not advise any drops or oral medications.   1. Blurred vision, right eye    I personally performed the services described in this documentation, which was scribed in my presence. The recorded  information has been reviewed and considered.  Osvaldo Human, MD    Carleene Cooper III, MD 11/11/11 [phone]

## 2011-11-11 NOTE — ED Notes (Signed)
Pt reports onset of a right temporal headache described as moderate.  He also reports right eye pain described as sharp.  Denies head injury or other symptoms. Dr. Ignacia Palma informed of patient condition.

## 2011-11-11 NOTE — ED Notes (Signed)
At 530 pt had a sharp jabbing pain behind right eye and a headache 30 minutes ago pt noticed right pupil dilated and has blurry vision in that eye has has iritis in the past

## 2011-11-12 ENCOUNTER — Ambulatory Visit (INDEPENDENT_AMBULATORY_CARE_PROVIDER_SITE_OTHER): Payer: 59 | Admitting: *Deleted

## 2011-11-12 DIAGNOSIS — Z23 Encounter for immunization: Secondary | ICD-10-CM

## 2011-12-04 ENCOUNTER — Ambulatory Visit: Payer: 59 | Admitting: Sports Medicine

## 2011-12-24 ENCOUNTER — Other Ambulatory Visit: Payer: 59 | Admitting: Sports Medicine

## 2012-01-03 ENCOUNTER — Encounter: Payer: Self-pay | Admitting: Family Medicine

## 2012-01-03 ENCOUNTER — Ambulatory Visit (INDEPENDENT_AMBULATORY_CARE_PROVIDER_SITE_OTHER): Payer: 59 | Admitting: Family Medicine

## 2012-01-03 VITALS — BP 141/87 | HR 73 | Ht 71.0 in | Wt 153.0 lb

## 2012-01-03 DIAGNOSIS — S838X9A Sprain of other specified parts of unspecified knee, initial encounter: Secondary | ICD-10-CM

## 2012-01-03 DIAGNOSIS — S86119A Strain of other muscle(s) and tendon(s) of posterior muscle group at lower leg level, unspecified leg, initial encounter: Secondary | ICD-10-CM

## 2012-01-03 MED ORDER — NITROGLYCERIN 0.2 MG/HR TD PT24
MEDICATED_PATCH | TRANSDERMAL | Status: DC
Start: 2012-01-03 — End: 2012-03-31

## 2012-01-03 NOTE — Progress Notes (Signed)
Dustin Burton is a 61 y.o. male who presents to [facility] today for right posterior knee pain.  Patient is an avid cyclist has had problems with his posterior knee pain over the last several weeks to months.  He was seen on October 15th for posterior knee pain thought to be hamstring insertional tendinitis.  This was treated with compressive sleeve which has helped.  However last week he noted  Onset of knee pain again.  He notes the pain is in the posterior lateral aspect of his knee.  He notes his pain is worse with cycling up hills fast.  He denies any radiating pain weakness numbness locking catching swelling fevers or chills.  Additionally he denies any night sweats or weight loss.  He feels well otherwise.  Pain is moderate no treatments tried.    PMH reviewed. Significant for prostate cancer status post prostatectomy History  Substance Use Topics  . Smoking status: Never Smoker   . Smokeless tobacco: Never Used  . Alcohol Use: Yes     Comment: socially   medications: Include ED meds ROS as above otherwise neg   Exam:  BP 141/87  Pulse 73  Ht 5\' 11"  (1.803 m)  Wt 153 lb (69.4 kg)  BMI 21.34 kg/m2 Gen: Well NAD MSK: Right knee. Normal-appearing no significant effusion Range of motion 0-120 without patellar crepitations Stable ligamentous exam negative McMurray's test Mildly tender over the posterior lateral corner of the knee specifically hamstring and gastrocnemius insertions. No Baker's cyst palpated.  Calf is normal appearing not erythematous and not tender normal ankle motion.   Capillary refill and pulses and sensation are intact bilateral lower extremities  Limited Musculoskeletal ultrasound of the right knee: Posterior lateral corner of the knee shows a small area of calcification within the insertion of the lateral head of the gastrocnemius associated with increased Doppler activity.   No Bakers cyst noted Posterior vascular structures appear to be normal Medial  gastrocnemius insertion mildly increased Doppler activity.

## 2012-01-03 NOTE — Patient Instructions (Addendum)
Thank you for coming in today. I think you have a strain or chronic irritation of the lateral insertion of the gastrocnemius tendon. We will start a course of eccentric exercises with leg curls (Negatives) Calf raises And bike trainer work that we talked about.   Lets do the nitroglycerin patches.  Nitroglycerin Protocol   Apply 1/4 nitroglycerin patch to affected area daily.  Change position of patch within the affected area every 24 hours.  You may experience a headache during the first 1-2 weeks of using the patch, these should subside.  If you experience headaches after beginning nitroglycerin patch treatment, you may take your preferred over the counter pain reliever.  Another side effect of the nitroglycerin patch is skin irritation or rash related to patch adhesive.  Please notify our office if you develop more severe headaches or rash, and stop the patch.  Tendon healing with nitroglycerin patch may require 12 to 24 weeks depending on the extent of injury.  Men should not use if taking Viagra, Cialis, or Levitra.   Do not use if you have migraines or rosacea.   Take the patch off 24 hours prior to planned ED med use.

## 2012-01-03 NOTE — Assessment & Plan Note (Signed)
Patient has a chronic appearing calcification at the insertion of the lateral head of the gastrocnemius onto the distal femur.  This is associated with increased Doppler activity and indicative of chronic tendinitis. Plan: Start nitroglycerin patch protocol and Eccentric exercises. This plan is complicated by his ED medication use.  We discussed this in detail. Patient will discontinue nitroglycerin patch 24 hours prior to planned ED medication use.  Followup in 6 weeks.  Patient expresses understanding and agreement

## 2012-02-11 ENCOUNTER — Ambulatory Visit (INDEPENDENT_AMBULATORY_CARE_PROVIDER_SITE_OTHER): Payer: 59 | Admitting: Family Medicine

## 2012-02-11 ENCOUNTER — Encounter: Payer: Self-pay | Admitting: Family Medicine

## 2012-02-11 VITALS — BP 109/74 | HR 70 | Temp 98.8°F | Ht 71.0 in | Wt 164.5 lb

## 2012-02-11 DIAGNOSIS — J45990 Exercise induced bronchospasm: Secondary | ICD-10-CM

## 2012-02-11 DIAGNOSIS — C61 Malignant neoplasm of prostate: Secondary | ICD-10-CM

## 2012-02-11 MED ORDER — ALBUTEROL SULFATE HFA 108 (90 BASE) MCG/ACT IN AERS: INHALATION_SPRAY | RESPIRATORY_TRACT | Status: DC

## 2012-02-11 MED ORDER — MONTELUKAST SODIUM 10 MG PO TABS: 10.0000 mg | ORAL_TABLET | Freq: Every day | ORAL | Status: DC

## 2012-02-11 NOTE — Patient Instructions (Addendum)
It was a pleasure to see you today.   For the shortness of breath, I am refilling the Singulair prescription to keep your hay fever symptoms at bay.  Use the albuterol HFA 30 minutes before exercise, and 4-6 hours as needed.  If your symptoms are not well controlled, a phone call to discuss restarting your Advair at that time.

## 2012-02-11 NOTE — Assessment & Plan Note (Signed)
Flare of exercise-induced asthma; may be another trigger that precipitated his coryza.  Has failed two antihistamines, each taken for about 1 month. Plan to continue Singulair daily; use HFA albuterol 30 minutes before exercise. If not adequately controlled, then may consider low-dose LABA/inhaled corticosteroid twice daily (had been on Advair in the past, several years ago).  He agrees to call if not better, tomorrow's cycling class may be a good test for this.

## 2012-02-11 NOTE — Progress Notes (Signed)
  Subjective:    Patient ID: Dustin Burton, male    DOB: 18-Dec-1950, 62 y.o.   MRN: [id number]  HPI Here for complaint of worsening shortness of breath with strenuous exercise, which he does about three times weekly.  Has some cough.  Has used his albuterol HFA (old one) and noted improvement.  Last used albuterol on Saturday, Jan 18h.  No fevers or chills.  Has had some hay fever and began taking singulair again, which has helped somewhat.  He tried Careers adviser for a month (December 2013) and Zyrtec (end-December to now, about 1 month total) with no resolution.  Reports that he is breathing well today, no issues during this visit.   Asthma diagnosed in his 58s.  Never has been hospitalized or intubated for this.  Triggers have usually been seasonal allergies. Now it appears more related to exertion.  Has a strenuous indoor cycling class tomorrow evening.  Of note, recently had his three-year follow up for prostate cancer with Dr Laverle Patter, is doing well.  May be able to be released from urology followups in 1 year and have yearly PSA with me here for long-term surveillance.     Review of Systems No fevers or chills.  Nasal/head congestion better with Singulair.     Objective:   Physical Exam Well appearing, no apparent distress HEENT Neck supple. No cervical adenopathy. No frontal or maxillary tenderness Clear oropharynx and clear TMs.  COR Regular S1S2 PULM Clear bilaterally, no wheezes or rhonchi.       Assessment & Plan:

## 2012-02-11 NOTE — Assessment & Plan Note (Signed)
Recently seen by Dr Laverle Patter.  May begin annual PSAs here in our office if all is well in year's time and released from regular urology follow-up.

## 2012-02-12 ENCOUNTER — Ambulatory Visit: Payer: 59 | Admitting: Family Medicine

## 2012-03-30 ENCOUNTER — Telehealth: Payer: Self-pay | Admitting: Internal Medicine

## 2012-03-30 ENCOUNTER — Telehealth: Payer: Self-pay | Admitting: Family Medicine

## 2012-03-30 NOTE — Telephone Encounter (Signed)
Patient states he called his PCP and was told to call and schedule with [place]. Yesterday, he had RUQ pain that was pressure like. States he "passed out" for 30 seconds. His wife told him he was sweating due to episode. He reports nausea and diarrhea x 4 after the episode. States he felt wiped out after the episode. Now he is having nausea and loose stools after eating. States he only has a "little abdominal pain nothing like yesterday." Patient states he had the same type episode 2 years ago and saw Dr. Juanda Chance and really did not find out the cause. Scheduled patient with Mike Gip, PA tomorrow at 10:00 AM.DOD-Dr. Arlyce Dice

## 2012-03-30 NOTE — Telephone Encounter (Signed)
Pt had an episode where he had upper abd pain and passed out yesterday - better today - not sure if he needs to come in here or go to a GI doctor.  pls advise

## 2012-03-30 NOTE — Telephone Encounter (Signed)
Agree with plan 

## 2012-03-30 NOTE — Telephone Encounter (Signed)
Had previous episode 2 years ago with upper abdominal pain.  Abdominal pain returned yesterday morning.  "Started sweating profusely, passed out, fell and hit head on table."  Has small cut above left eye.  Did not go to hospital for evaluation.  Patient states he "feels nauseated after eating, very hungry all the time."  Pain is better now, but has "mild indigestion" and has been having more diarrhea and "burping/belching".  Has been taking Pepcid OTC for symptoms.  Patient has seen Dr. Juanda Chance in the past for colonoscopy and IBS.  Wants to know if he needs to be referred back to GI for current symptoms.  Will route note to Dr. Mauricio Po for advice and call patient back.  Gaylene Brooks, RN

## 2012-03-30 NOTE — Telephone Encounter (Signed)
Discussed with Dr. Mauricio Po.  Ok for patient to call and schedule appt with Dr. Regino Schultze office since he is already an established patient.  Patient informed.  Gaylene Brooks, RN

## 2012-03-31 ENCOUNTER — Other Ambulatory Visit (INDEPENDENT_AMBULATORY_CARE_PROVIDER_SITE_OTHER): Payer: 59

## 2012-03-31 ENCOUNTER — Ambulatory Visit (INDEPENDENT_AMBULATORY_CARE_PROVIDER_SITE_OTHER)
Admission: RE | Admit: 2012-03-31 | Discharge: 2012-03-31 | Disposition: A | Discharge status: Home / Self Care | Payer: 59 | Source: Ambulatory Visit | Attending: Physician Assistant | Admitting: Physician Assistant

## 2012-03-31 ENCOUNTER — Encounter: Payer: Self-pay | Admitting: Physician Assistant

## 2012-03-31 ENCOUNTER — Ambulatory Visit (INDEPENDENT_AMBULATORY_CARE_PROVIDER_SITE_OTHER): Payer: 59 | Admitting: Physician Assistant

## 2012-03-31 VITALS — BP 122/78 | HR 78 | Ht 71.0 in | Wt 164.8 lb

## 2012-03-31 DIAGNOSIS — K52832 Lymphocytic colitis: Secondary | ICD-10-CM

## 2012-03-31 DIAGNOSIS — R55 Syncope and collapse: Secondary | ICD-10-CM

## 2012-03-31 DIAGNOSIS — R112 Nausea with vomiting, unspecified: Secondary | ICD-10-CM

## 2012-03-31 DIAGNOSIS — R1013 Epigastric pain: Secondary | ICD-10-CM

## 2012-03-31 DIAGNOSIS — Z800 Family history of malignant neoplasm of digestive organs: Secondary | ICD-10-CM

## 2012-03-31 DIAGNOSIS — K5289 Other specified noninfective gastroenteritis and colitis: Secondary | ICD-10-CM

## 2012-03-31 DIAGNOSIS — R1011 Right upper quadrant pain: Secondary | ICD-10-CM

## 2012-03-31 LAB — CBC WITH DIFFERENTIAL/PLATELET
Basophils Relative: 0.5 % (ref 0.0–3.0)
Eosinophils Relative: 2.1 % (ref 0.0–5.0)
HCT: 43.6 % (ref 39.0–52.0)
Hemoglobin: 14.8 g/dL (ref 13.0–17.0)
Lymphs Abs: 1.5 10*3/uL (ref 0.7–4.0)
MCV: 93.9 fl (ref 78.0–100.0)
Monocytes Absolute: 0.8 10*3/uL (ref 0.1–1.0)
Monocytes Relative: 12.5 % — ABNORMAL HIGH (ref 3.0–12.0)
Neutro Abs: 3.7 10*3/uL (ref 1.4–7.7)
Platelets: 268 10*3/uL (ref 150.0–400.0)
WBC: 6.1 10*3/uL (ref 4.5–10.5)

## 2012-03-31 LAB — LIPASE: Lipase: 28 U/L (ref 11.0–59.0)

## 2012-03-31 LAB — COMPREHENSIVE METABOLIC PANEL
Alkaline Phosphatase: 64 U/L (ref 39–117)
BUN: 21 mg/dL (ref 6–23)
CO2: 27 mEq/L (ref 19–32)
Creatinine, Ser: 1.1 mg/dL (ref 0.4–1.5)
GFR: 73.62 mL/min (ref >60.00)
Glucose, Bld: 94 mg/dL (ref 70–99)
Sodium: 138 mEq/L (ref 135–145)
Total Bilirubin: 0.7 mg/dL (ref 0.3–1.2)
Total Protein: 7.2 g/dL (ref 6.0–8.3)

## 2012-03-31 MED ORDER — IOHEXOL 300 MG/ML  SOLN
100.0000 mL | Freq: Once | INTRAMUSCULAR | Status: AC | PRN
  Administered 2012-03-31: 100 mL via INTRAVENOUS

## 2012-03-31 MED ORDER — ONDANSETRON 4 MG PO TBDP: ORAL_TABLET | ORAL | Status: DC

## 2012-03-31 NOTE — Progress Notes (Signed)
I agree with the initial work up, CT scan, Labs, will review results.

## 2012-03-31 NOTE — Patient Instructions (Addendum)
Please go to the basement level to have your labs drawn.   You have been scheduled for a CT scan of the abdomen and pelvis at [facility] ([address]---this is in the same building as [facility]).   You are scheduled on 03-31-2012 at 2:15 . You should arrive at 2:00 PM prior to your appointment time for registration. Please follow the written instructions below on the day of your exam:  WARNING: IF YOU ARE ALLERGIC TO IODINE/X-RAY DYE, PLEASE NOTIFY RADIOLOGY IMMEDIATELY AT [phone]! YOU WILL BE GIVEN A 13 HOUR PREMEDICATION PREP.  1) Do not eat or drink anything after 10:15 AM (4 hours prior to your test) 2) You have been given 2 bottles of oral contrast to drink. The solution may taste better if refrigerated, but do NOT add ice or any other liquid to this solution. Shake well before drinking.    Drink 1 bottle of contrast @ 12:15 PM  (2 hours prior to your exam)  Drink 1 bottle of contrast @ 1:15 PM (1 hour prior to your exam)  You may take any medications as prescribed with a small amount of water except for the following: Metformin, Glucophage, Glucovance, Avandamet, Riomet, Fortamet, Actoplus Met, Janumet, Glumetza or Metaglip. The above medications must be held the day of the exam AND 48 hours after the exam.  The purpose of you drinking the oral contrast is to aid in the visualization of your intestinal tract. The contrast solution may cause some diarrhea. Before your exam is started, you will be given a small amount of fluid to drink. Depending on your individual set of symptoms, you may also receive an intravenous injection of x-ray contrast/dye. Plan on being at [facility] for 30 minutes or long, depending on the type of exam you are having performed.  If you have any questions regarding your exam or if you need to reschedule, you may call the CT department at [phone] between the hours of 8:00 am and 5:00 pm,  Monday-Friday.  ________________________________________________________________________

## 2012-03-31 NOTE — Progress Notes (Signed)
Subjective:    Patient ID: Dustin Burton, male    DOB: 03/13/1950, 62 y.o.   MRN: [id number]  HPI Dustin Burton is a pleasant 62 year old white male known to Dr. Lina Sar with history of prostate cancer for which she is status post prostatectomy, also has history of asthma and IBS. He also has diagnosis of lymphocytic colitis made at the time of colonoscopy in June of 2012 and responded well to a course of Entocort. Family history is positive for colon cancer in his father. Patient comes in today as an urgent add-on with onset of her current symptoms on Sunday, 03/29/2012. He says he felt fine when he went to bed Saturday evening and was awakened from sleep at about 6 AM with intense pain in his epigastrium radiating to his right upper abdomen. He had no radiation into his chest or into his back area he describes as a pressure or portion up type of feeling that was associated with nausea diaphoresis and a brief episode of syncope. His wife was in the room and the patient states that he was out for about 30 seconds. He hit his head on the bedside table the after the syncopal episode the pain was gone but he remained nauseated the rest of the day and fatigue feeling. He ate very light amount up having several bowel movements which became loose. He also had a lot of gas and belching etc. He says that evening he felt nauseated again took some Phenergan which did help and then yesterday felt persistently nauseated especially after eating anything and noted that he felt tender in his upper abdomen. He has not had any further diarrhea no fever or chills, no shortness of breath cough etc. He says that he had had a syncopal episode one time before with abdominal pain that was in his lower left abdomen. Has not had any further dizziness lightheadedness headache etc.    Review of Systems  Constitutional: Positive for diaphoresis, appetite change and fatigue.  HENT: Negative.   Eyes: Negative.   Respiratory: Negative.    Cardiovascular: Negative.   Gastrointestinal: Positive for nausea, abdominal pain and diarrhea.  Endocrine: Negative.   Genitourinary: Negative.   Musculoskeletal: Negative.   Allergic/Immunologic: Negative.   Neurological: Positive for syncope.  Hematological: Negative.   Psychiatric/Behavioral: Negative.    Outpatient Prescriptions Prior to Visit  Medication Sig Dispense Refill  . albuterol (PROAIR HFA) 108 (90 BASE) MCG/ACT inhaler 2 puffs 3o minutes before strenuous exercise, every 4 to 6 hours as needed for shortness of breath.  2 Inhaler  5  . atorvastatin (LIPITOR) 20 MG tablet Take 1 tablet (20 mg total) by mouth daily.  90 tablet  3  . montelukast (SINGULAIR) 10 MG tablet Take 1 tablet (10 mg total) by mouth at bedtime.  30 tablet  11  . Multiple Vitamin (MULTIVITAMIN) tablet Take 1 tablet by mouth daily.        . sildenafil (VIAGRA) 25 MG tablet Take 25 mg by mouth daily as needed.        . nitroGLYCERIN (NITRODUR - DOSED IN MG/24 HR) 0.2 mg/hr Apply 1/4 patch to the painful area on the knee daily. Do not use with ED meds  30 patch  1  . Probiotic Product (PROBIOTIC FORMULA PO) Take 1 capsule by mouth daily.         Facility-Administered Medications Prior to Visit  Medication Dose Route Frequency Provider Last Rate Last Dose  . 0.9 %  sodium chloride infusion  500 mL Intravenous Continuous Hart Carwin, MD       Allergies  Allergen Reactions  . Tadalafil Other (See Comments)    Muscular leg pain   Patient Active Problem List  Diagnosis  . ADENOCARCINOMA, PROSTATE  . HYPERCHOLESTEROLEMIA  . ASTHMA, EXERCISE INDUCED  . Sinusitis  . Gastrocnemius strain  . Lymphocytic colitis  . Family hx of colon cancer   History  Substance Use Topics  . Smoking status: Never Smoker   . Smokeless tobacco: Never Used  . Alcohol Use: Yes     Comment: socially   family history includes Colon cancer in his father; Coronary artery disease in his mother; Crohn's disease in his  father and mother; Cystic fibrosis in an unspecified family member; and Hypertension in his father and mother.     Objective:   Physical Exam well-developed thin white male in no acute distress, pleasant blood pressure 122/78 pulse 78 height 5 foot 11 weight 164 O2 sat 97. HEENT nontraumatic normocephalic EOMI PERRLA sclera anicteric, Supple no JVD, Cardiovascular regular rate and rhythm with S1-S2 no murmur or gallop, Pulmonary clear bilaterally, Abdomen soft he is tender in the epigastrium and right upper quadrant no guarding or rebound no palpable mass or hepatosplenomegaly bowel sounds are present, Rectal exam not done, Extremities no clubbing cyanosis or edema skin warm and dry, Psych mood and affect normal and appropriate        Assessment & Plan:  #79 62 year old male with history of lymphocytic colitis which has been quiescent  and IBS. Now presenting with onset of intense epigastric and right upper quadrant pain about 48 hours ago so seated with nausea diaphoresis and a brief syncopal episode now with persistent nausea and upper abdominal discomfort. Initially had mild diarrhea which has resolved. Etiology of his symptoms is not clear we'll rule out gallbladder disease, cholecystitis, pancreatitis, peptic ulcer disease and/or vascular event intra-abdominal. #2 History of prostate cancer status post prostatectomy # 3  family history of colon cancer up to date with: Screening last colonoscopy June 2012  Plan; CBC with differential, CMET,and lipase today CT  scan of the abdomen and pelvis today with contrast. Zofran 4 mg ODT every 6 hours when necessary nausea #30 and one refill Further plans for pending results of labs and CT.

## 2012-04-23 ENCOUNTER — Telehealth: Payer: Self-pay | Admitting: Physician Assistant

## 2012-04-23 MED ORDER — OMEPRAZOLE 40 MG PO CPDR: DELAYED_RELEASE_CAPSULE | ORAL | Status: DC

## 2012-04-23 NOTE — Telephone Encounter (Signed)
Rx sent to pharmacy. Patient aware. 

## 2012-04-23 NOTE — Telephone Encounter (Signed)
Spoke with patient and he states he saw Mike Gip, PA about 3 weeks ago for abdominal pain with syncopal episode. Labs and CT were normal. He is calling to report worse intermittent symptoms of night time pain at sternum and bloating. Also occurs about 2 hours after eating. He has tried Zantac 150 mg with a little relief. Please, advise.

## 2012-04-23 NOTE — Telephone Encounter (Signed)
I have spoken to the pt, I think, his symptoms are due to acid reflux, they occur at night. I told him to keep taking  Ranitidine 150 mg in am and that we are going to send him Prilosec 40 mg, #30 to take 1 at bedtime to prevent the nocturnal reflux. He will let us know if it work in about 2 weeks. If not, then I suggested EGD.

## 2012-08-04 ENCOUNTER — Ambulatory Visit (INDEPENDENT_AMBULATORY_CARE_PROVIDER_SITE_OTHER): Payer: 59 | Admitting: Family Medicine

## 2012-08-04 ENCOUNTER — Encounter: Payer: Self-pay | Admitting: Family Medicine

## 2012-08-04 VITALS — BP 128/74 | HR 76 | Ht 71.0 in | Wt 157.0 lb

## 2012-08-04 DIAGNOSIS — R5381 Other malaise: Secondary | ICD-10-CM | POA: Insufficient documentation

## 2012-08-04 DIAGNOSIS — E7800 Pure hypercholesterolemia, unspecified: Secondary | ICD-10-CM

## 2012-08-04 DIAGNOSIS — C61 Malignant neoplasm of prostate: Secondary | ICD-10-CM

## 2012-08-04 DIAGNOSIS — R5383 Other fatigue: Secondary | ICD-10-CM

## 2012-08-04 LAB — POCT URINALYSIS DIPSTICK
Bilirubin, UA: NEGATIVE
Glucose, UA: NEGATIVE
Leukocytes, UA: NEGATIVE
Nitrite, UA: NEGATIVE
Urobilinogen, UA: 0.2

## 2012-08-04 MED ORDER — ATORVASTATIN CALCIUM 20 MG PO TABS: 20.0000 mg | ORAL_TABLET | Freq: Every day | ORAL | Status: DC

## 2012-08-04 NOTE — Patient Instructions (Addendum)
It was a pleasure to see you today.  I will contact you when the results of your lab studies are back.  We can determine at that time the interval at which we should follow up on the generalized malaise that you have been experiencing.

## 2012-08-05 ENCOUNTER — Telehealth: Payer: Self-pay | Admitting: Family Medicine

## 2012-08-05 LAB — CBC WITH DIFFERENTIAL/PLATELET
Hemoglobin: 14.2 g/dL (ref 13.0–17.0)
Lymphocytes Relative: 24 % (ref 12–46)
Lymphs Abs: 1.3 10*3/uL (ref 0.7–4.0)
MCV: 94.3 fL (ref 78.0–100.0)
Monocytes Relative: 12 % (ref 3–12)
Neutrophils Relative %: 62 % (ref 43–77)
Platelets: 293 10*3/uL (ref 150–400)
RBC: 4.4 MIL/uL (ref 4.22–5.81)
WBC: 5.4 10*3/uL (ref 4.0–10.5)

## 2012-08-05 LAB — LIPID PANEL
Cholesterol: 191 mg/dL (ref 0–200)
HDL: 47 mg/dL (ref >39)
Total CHOL/HDL Ratio: 4.1 Ratio

## 2012-08-05 LAB — TSH: TSH: 1.901 u[IU]/mL (ref 0.350–4.500)

## 2012-08-05 LAB — COMPREHENSIVE METABOLIC PANEL
Albumin: 4.5 g/dL (ref 3.5–5.2)
Alkaline Phosphatase: 56 U/L (ref 39–117)
BUN: 17 mg/dL (ref 6–23)
CO2: 31 mEq/L (ref 19–32)
Glucose, Bld: 83 mg/dL (ref 70–99)
Sodium: 140 mEq/L (ref 135–145)
Total Bilirubin: 0.7 mg/dL (ref 0.3–1.2)
Total Protein: 7.1 g/dL (ref 6.0–8.3)

## 2012-08-05 NOTE — Assessment & Plan Note (Signed)
Relatively new onset; no clear etiology.  To check labs including CBC, TSH, CMet today.  His CBC and metabolic profile were normal in March.

## 2012-08-05 NOTE — Progress Notes (Signed)
  Subjective:    Patient ID: Dustin Burton, male    DOB: 1950-09-19, 62 y.o.   MRN: [id number]  HPI  Patient here for reported wellness exam, we discussed several issues/complaints:  1. Overall, Mr Schipani reports that he has had low energy in recent weeks.  Had been biking a lot (did a 100-mile ride and a 60-mile ride before the onset of fatigue); not febrile.  Has difficulty pinning down to a specific symptom.  2. In March he had some excruciating epigastric abdominal pain that came on while laying in bed at night.  Was so severe that he lost consciousness and hit his head on the nightstand.  His wife had reported that he was LOC for 3-4 minutes and "looked like a seizure" (she is not here with him today).  He saw his GI's office (Dr. Lina Sar), and was restarted on his PPI, which seemed to help.  In recent weeks he has noticed an onset of the abdominal pain while riding his bike, not as severe as initially, no presyncope or syncope.  No prior cardiac or seizure history.  He notes that on the episodes where he lost consciousness, he would awaken with nausea and even threw up on a few occasions.  He had diaphoresis as well. No dyspnea or cough.  Has had some mild nonbloody diarrhea from time to time.  He has a history of lymphocytic colitis and had his last colonoscopy in 2012.  3. Has had recurrence of L-sided varicocele that is bothering him.  He believes it may be related to his bike riding.  He is s/p prostatectomy for prostate cancer, followed by Dr Laverle Patter.  Is scheduled to see Dr Laverle Patter next week for followup, expects to be going to annual prostate followups soon (now at Q 6 months). Feels a tingling/burning discomfort across perineum that is constant, no associated dysuria or difficulty with void.    Review of Systems No chest pain, no dyspnea; see above for GI ROS.  No difficulty with urinary incontinence or dysuria.  No extremity swelling No fevers or chills. Facial rash has cleared (saw Derm  and was prescribed a topical steroid for face, does not recall the diagnosis he was given by Vaughan Sine).      Objective:   Physical Exam Well appearing, no apparent distress HEENT Neck supple, no cervical adenopathy. No thyroid nodules or tenderness. Moist mucus membranes. Conjunctivae without pallor. COR Regular S1S2, no extra sounds or murmurs. PULM Clear bilaterally, no rales or wheezes.  ABD Soft, nontender,nondistended.  No masses or megaly.  No CVA tenderness or suprapubic tenderness. LEs: No edema bilaterally.  GU deferred       Assessment & Plan:

## 2012-08-05 NOTE — Telephone Encounter (Signed)
Called and spoke with patient on his mobile, reported lab results.  Given his current lipid numbers, his 10-yr CVD risk is 4.3%.  Will opt to continue current statin therapy.   JB

## 2012-08-05 NOTE — Assessment & Plan Note (Signed)
We discussed his use of statin and calculated his 10-yr risk based upon his old lipid numbers.  We will recheck these today and recalculate.  I have discussed with him the fact that based on newer guidelines, once a patient is categorized as likely to benefit from a statin, we are no longer 'treating to goal' but rather are continuing with the statin to mitigate the risk of CVD.

## 2012-08-05 NOTE — Assessment & Plan Note (Signed)
Continues with follow up with Urology/Dr Laverle Patter.  For his routine followup and PSA in the coming week.

## 2012-08-14 ENCOUNTER — Ambulatory Visit: Payer: 59 | Admitting: Family Medicine

## 2012-08-17 ENCOUNTER — Ambulatory Visit: Payer: 59

## 2012-08-20 HISTORY — PX: INGUINAL HERNIA REPAIR: SUR1180

## 2012-08-21 ENCOUNTER — Telehealth: Payer: Self-pay | Admitting: *Deleted

## 2012-08-21 ENCOUNTER — Ambulatory Visit: Payer: 59

## 2012-08-21 NOTE — Telephone Encounter (Signed)
Pt called and vm left that we do not give shingles vaccination in clinic and that local pharmacies might be able to assist. Called work number no answer. Wyatt Haste, RN-BSN

## 2012-08-25 ENCOUNTER — Ambulatory Visit: Payer: 59 | Admitting: Family Medicine

## 2012-08-27 ENCOUNTER — Ambulatory Visit (INDEPENDENT_AMBULATORY_CARE_PROVIDER_SITE_OTHER): Payer: 59 | Admitting: Family Medicine

## 2012-08-27 ENCOUNTER — Encounter: Payer: Self-pay | Admitting: Family Medicine

## 2012-08-27 VITALS — BP 115/71 | HR 65 | Temp 98.5°F | Wt 156.0 lb

## 2012-08-27 DIAGNOSIS — D489 Neoplasm of uncertain behavior, unspecified: Secondary | ICD-10-CM

## 2012-08-27 DIAGNOSIS — L989 Disorder of the skin and subcutaneous tissue, unspecified: Secondary | ICD-10-CM

## 2012-08-27 NOTE — Patient Instructions (Signed)
Hi Mr. Hamon,  It was a pleasure to meet you today and I enjoyed briefly hearing about your biking adventures. Today you came in to get an assessment of a lesion on your left arm. We suspect that it is actinic keratosis. We performed a punch biopsy in order to determine type of the lesion. We will send the sample to pathology to get a better understanding of what type of lesion you have.  If you have any questions, please do not hesitate to call our office.  Thanks,  Jacquelin Hawking, MD

## 2012-08-27 NOTE — Progress Notes (Signed)
  Subjective:    Patient ID: Dustin Burton, male    DOB: 1950/12/14, 62 y.o.   MRN: [id number]  HPI Comments: Mr. Berwick has a 1 month history of a lesion on his left arm. It has changed in morphology from round to irregular. The color was initially skin color and progressively darkened, developing a scaly surface that was eventually taken off when he took a shower. There was no bleeding associated. He has had itching and burning of the lesion but no pain.     Review of Systems  Constitutional: Negative for fever.  Gastrointestinal: Negative for nausea, vomiting and diarrhea.       Objective:   Physical Exam  Constitutional: He appears well-developed and well-nourished.  Skin:  He has a hyperkeratotic 1cm x 0.6cm assymetrical macular lesion on his left anterior upper arm with irregular sharply demarcated boarders.          Assessment & Plan:

## 2012-08-27 NOTE — Assessment & Plan Note (Signed)
Most likely Actinic Keratosis vs Seborrheic Keratosis. Punch biopsy performed in clinic and sent for pathology. Will follow-up results and patient will reschedule appointment for when he arrives back in town. If actinic keratosis, consider freezing or curettage of lesion.

## 2012-11-26 ENCOUNTER — Other Ambulatory Visit: Payer: Self-pay

## 2012-12-02 ENCOUNTER — Encounter: Payer: Self-pay | Admitting: Family Medicine

## 2012-12-02 ENCOUNTER — Ambulatory Visit (INDEPENDENT_AMBULATORY_CARE_PROVIDER_SITE_OTHER): Payer: 59 | Admitting: Family Medicine

## 2012-12-02 VITALS — BP 139/85 | HR 76 | Temp 98.2°F | Wt 158.0 lb

## 2012-12-02 DIAGNOSIS — R509 Fever, unspecified: Secondary | ICD-10-CM

## 2012-12-02 NOTE — Progress Notes (Signed)
Patient ID: Dustin Burton, male   DOB: [birth year], 62 y.o.   MRN: [id number]  Subjective  Dustin Burton is a 62 y.o. male with a five day history of fevers at night, headaches, and fatigue.  He reports that this started on Saturday night with a temperature of 99.9 that resolved with some Advil.  Since then he has felt fatigued and some headaches.  He reports feeling pretty well in the mornings but has worse symptoms at night, including sweating.  He reports some tightness in his throat when swallowing and a decreased appetite.  He also has had some diarrhea, but says that this was not new.  He denies any nausea, vomiting, congestion, trouble breathing, or chest pain.  He does report receiving the flu vaccine.  ROS: negative except per HPI  Objective  Filed Vitals:   12/02/12 1503  BP: 139/85  Pulse: 76  Temp: 98.2 F (36.8 C)   General: Well appearing, no apparent distress  HEENT: Some cervical adenopathy noted, neck supple. No thyroid nodules or tenderness. Moist mucus membranes. Conjunctivae without pallor. Normal external auditory canals and nasal canals COR: Regular S1S2, no extra sounds or murmurs.  PULM: Clear bilaterally, no rales or wheezes.  ABD: Soft, nontender,nondistended. No masses or megaly. No CVA tenderness or suprapubic tenderness.  LEs: No edema bilaterally.  GU: deferred  Assessment Dustin Burton is a 62 y.o. male

## 2012-12-03 NOTE — Progress Notes (Signed)
Patient ID: DAREN YEAGLE, male   DOB: [birth year], 62 y.o.   MRN: [id number]  Subjective   Mr. Lysaght is a 62 y.o. male with a five day history of fevers at night, headaches, and fatigue. He reports that this started on Saturday night with a temperature of 99.9 that resolved with some Advil. Since then he has felt fatigued and some headaches. He reports feeling pretty well in the mornings but has worse symptoms at night, including sweating. He reports some tightness in his throat when swallowing and a decreased appetite. He also has had some diarrhea, but says that this was not new. He denies any nausea, vomiting, congestion, trouble breathing, or chest pain.  He does report receiving the flu vaccine. No sick contacts.   ROS: negative except per HPI   Objective   Filed Vitals:    12/02/12 1503   BP:  139/85   Pulse:  76   Temp:  98.2 F (36.8 C)    General: Well appearing, no apparent distress  HEENT: Some cervical adenopathy noted, neck supple. No thyroid nodules or tenderness. Moist mucus membranes. Conjunctivae without pallor. Normal external auditory canals and nasal canals  CSubjective   Mr. Vanbenschoten is a 62 y.o. male with a five day history of fevers at night, headaches, and fatigue. He reports that this started on Saturday night with a temperature of 99.9 that resolved with some Advil. Since then he has felt fatigued and some headaches. He reports feeling pretty well in the mornings but has worse symptoms at night, including sweating. He reports some tightness in his throat when swallowing and a decreased appetite. He also has had some diarrhea, but says that this was not new. He denies any nausea, vomiting, congestion, trouble breathing, or chest pain.  He does report receiving the flu vaccine.  ROS: negative except per HPI  Objective   Filed Vitals:    12/02/12 1503   BP:  139/85   Pulse:  76   Temp:  98.2 F (36.8 C)    General: Well appearing, no apparent distress  HEENT: Some  cervical adenopathy noted, neck supple. No thyroid nodules or tenderness. Moist mucus membranes. Conjunctivae without pallor. Normal external auditory canals and nasal canals; no submental, submandibular, cervical, or supraclavicular lymphadenopathy  CV: Regular S1S2, no extra sounds or murmurs.  PULM: Clear bilaterally, no rales or wheezes.  ABD: Soft, nontender,nondistended. No masses or megaly. No CVA tenderness or suprapubic tenderness.  LEs: No edema bilaterally.     Assessment/Plan Mr. Bills is a 62 y.o. Male with symptoms consistent with a viral illness and no red flags for serious bacterial infection. Therefore the patient was counseled on expectant management and will return if it worsens.

## 2013-01-26 ENCOUNTER — Ambulatory Visit (INDEPENDENT_AMBULATORY_CARE_PROVIDER_SITE_OTHER): Payer: 59 | Admitting: Family Medicine

## 2013-01-26 ENCOUNTER — Other Ambulatory Visit: Payer: Self-pay | Admitting: Family Medicine

## 2013-01-26 ENCOUNTER — Encounter: Payer: Self-pay | Admitting: Family Medicine

## 2013-01-26 VITALS — BP 132/84 | HR 67 | Temp 97.6°F | Ht 71.0 in | Wt 159.8 lb

## 2013-01-26 DIAGNOSIS — R61 Generalized hyperhidrosis: Secondary | ICD-10-CM

## 2013-01-26 DIAGNOSIS — Z111 Encounter for screening for respiratory tuberculosis: Secondary | ICD-10-CM

## 2013-01-26 DIAGNOSIS — Z1159 Encounter for screening for other viral diseases: Secondary | ICD-10-CM

## 2013-01-26 LAB — CBC WITH DIFFERENTIAL/PLATELET
BASOS ABS: 0 10*3/uL (ref 0.0–0.1)
BASOS PCT: 1 % (ref 0–1)
EOS PCT: 2 % (ref 0–5)
Eosinophils Absolute: 0.1 10*3/uL (ref 0.0–0.7)
HEMATOCRIT: 42.6 % (ref 39.0–52.0)
HEMOGLOBIN: 14.9 g/dL (ref 13.0–17.0)
Lymphocytes Relative: 29 % (ref 12–46)
Lymphs Abs: 1.6 10*3/uL (ref 0.7–4.0)
MCH: 32 pg (ref 26.0–34.0)
MCHC: 35 g/dL (ref 30.0–36.0)
MCV: 91.4 fL (ref 78.0–100.0)
MONO ABS: 0.7 10*3/uL (ref 0.1–1.0)
MONOS PCT: 13 % — AB (ref 3–12)
Neutro Abs: 3.2 10*3/uL (ref 1.7–7.7)
Neutrophils Relative %: 55 % (ref 43–77)
Platelets: 320 10*3/uL (ref 150–400)
RBC: 4.66 MIL/uL (ref 4.22–5.81)
RDW: 14.3 % (ref 11.5–15.5)
WBC: 5.7 10*3/uL (ref 4.0–10.5)

## 2013-01-26 LAB — COMPREHENSIVE METABOLIC PANEL
ALT: 46 U/L (ref 0–53)
AST: 33 U/L (ref 0–37)
Albumin: 4.5 g/dL (ref 3.5–5.2)
Alkaline Phosphatase: 80 U/L (ref 39–117)
BILIRUBIN TOTAL: 0.5 mg/dL (ref 0.3–1.2)
BUN: 16 mg/dL (ref 6–23)
CALCIUM: 9 mg/dL (ref 8.4–10.5)
CHLORIDE: 101 meq/L (ref 96–112)
CO2: 27 meq/L (ref 19–32)
CREATININE: 0.91 mg/dL (ref 0.50–1.35)
GLUCOSE: 89 mg/dL (ref 70–99)
Potassium: 4.7 mEq/L (ref 3.5–5.3)
SODIUM: 138 meq/L (ref 135–145)
TOTAL PROTEIN: 7.1 g/dL (ref 6.0–8.3)

## 2013-01-26 LAB — HIV ANTIBODY (ROUTINE TESTING W REFLEX): HIV: NONREACTIVE

## 2013-01-26 LAB — HEPATITIS C ANTIBODY, REFLEX: HCV AB: NEGATIVE

## 2013-01-26 LAB — RPR

## 2013-01-26 MED ORDER — TUBERCULIN PPD 5 UNIT/0.1ML ID SOLN: 5.0000 [IU] | Freq: Once | INTRADERMAL | Status: DC

## 2013-01-26 MED ORDER — ALBUTEROL SULFATE HFA 108 (90 BASE) MCG/ACT IN AERS: INHALATION_SPRAY | RESPIRATORY_TRACT | Status: DC

## 2013-01-26 NOTE — Patient Instructions (Addendum)
It was a pleasure to see you today.  I am ordering labs and a chest xray; also a TB skin test which will require the nurse to read in 2 to 3 days (this Thurs or Fri).   I will contact you with the results of the studies as soon as I get them.  I would like to see you back in the next 1-2 weeks to follow up.

## 2013-01-27 ENCOUNTER — Ambulatory Visit
Admission: RE | Admit: 2013-01-27 | Discharge: 2013-01-27 | Disposition: A | Discharge status: Home / Self Care | Payer: 59 | Source: Ambulatory Visit | Attending: Family Medicine | Admitting: Family Medicine

## 2013-01-27 ENCOUNTER — Telehealth: Payer: Self-pay | Admitting: Family Medicine

## 2013-01-27 DIAGNOSIS — R61 Generalized hyperhidrosis: Secondary | ICD-10-CM

## 2013-01-27 DIAGNOSIS — Z111 Encounter for screening for respiratory tuberculosis: Secondary | ICD-10-CM

## 2013-01-27 DIAGNOSIS — Z1159 Encounter for screening for other viral diseases: Secondary | ICD-10-CM

## 2013-01-27 LAB — C-REACTIVE PROTEIN: CRP: <0.5 mg/dL (ref <0.60)

## 2013-01-27 LAB — ANA: Anti Nuclear Antibody(ANA): POSITIVE — AB

## 2013-01-27 LAB — ANTI-NUCLEAR AB-TITER (ANA TITER): ANA Titer 1: 1:1280 {titer} — ABNORMAL HIGH

## 2013-01-27 NOTE — Telephone Encounter (Signed)
Called patient to discuss his lab results; he is going for CXR this morning and to have PPD read tomorrow here at [facility]. CRP was added to the labs that were drawn yesterday.  I will call him with results of CXR when available; if negative will plan for CT head/neck/chest/abd/pelvis.  JB

## 2013-01-27 NOTE — Assessment & Plan Note (Signed)
Patient meets criteria for cohort screening for HCV by birth year; he is in agreement with plans for screening.

## 2013-01-27 NOTE — Progress Notes (Signed)
   Subjective:    Patient ID: Dustin Burton, male    DOB: [birth year], 63 y.o.   MRN: [id number]  HPI Dustin Burton presents with complaint of discomfort around the L angle of the mandibule, with some radiation to the L ear intermittently that feels like increased ear pressure. Has been intermittent for a few months, since around November 2014.  Occasionally will notice some L jaw swelling that later remits on its own. No otorrhea.  No dental pain.  Has regular dental checkup scheduled for next month.   No visual changes, no headaches.  No hoarseness or xerostomia.  Occasionally accompanied by night sweats and low-grade fevers, fatigue.  Dustin Burton is used to intensive bicycle riding, has been unable to ride for a few weeks due to associated fatigue.  Gets tired just to walk his dogs.  Also notes that last week Dustin Burton developed an acute-onset cough/HA/fever to 102F that Dustin Burton thinks was influenza, has gotten markedly better over the course of the past week.  No longer with as much cough.   PMHX; history prostate cancer with prostatectomy, followed by Dr Alinda Money with serial PSAs that have been undetectable by patient report.   Social Hx; Never been a smoker, does not drink alcohol to any appreciable degree. One time travel out of the country to [place] in the [place]; other travel over 1 year ago to [place] (no recent travel to TB-endemic areas).    Review of Systems No weight loss, has been eating relatively well until the influenza last week.  Periodic night sweats and low-grade fevers (to 99.13F) when Dustin Burton has the malaise and fatigue.  Continues to have 2 formed non-bloody bowel movements a day, no changes in bowel or bladder habits.  No nausea or vomiting.  Aside from recent flu-like illness, no cough or shortness of breath.  Did use his albuterol HFA during flu illness but otherwise not needing.  No chest pain. No abdominal pain.      Objective:   Physical Exam Generally well appearing, no apparent distress.  HEENT  Neck supple, normal conjunctivae and sclerae. No anterior/posterior cervical or supraclavicular adenopathy, no axillary adenopathy.  Clear oropharynx, thyroid non-nodular and nontender. No masses or tenderness along parotids.  Moist mucus membranes. Dentition intact.  TMs normal bilaterally with good cones of light.  Positive Spurlings to the L side. No tenderness over temples bilaterally, no maxillary or frontal sinus tenderness.  COR Regular S1S2, no extra sounds PULM Clear bilaterally, no rales or wheezes bilaterally.  ABD Soft, nontender, nondistended, no organomegaly.  No inguinal adenopathy bilaterally.  LEs no edema noted.        Assessment & Plan:

## 2013-01-27 NOTE — Assessment & Plan Note (Signed)
Patient with cyclical episodes of night sweats, fevers, myalgias, have been impacting his level of activity.  Will start with preliminary labwork today, as well as PPD and CXR.  If these are not revealing, to consider CT imaging of head/neck, as well as chest, abd/pelvis given patient's personal history of prostate cancer, and family history of colon cancer.  Discussed plan for workup with patient and he is in agreement with this plan.  Plan for close follow up in the office, discussed red flags to prompt more immediate follow up.

## 2013-01-28 ENCOUNTER — Ambulatory Visit: Payer: 59 | Admitting: *Deleted

## 2013-01-28 ENCOUNTER — Telehealth: Payer: Self-pay | Admitting: Family Medicine

## 2013-01-28 DIAGNOSIS — Z111 Encounter for screening for respiratory tuberculosis: Secondary | ICD-10-CM

## 2013-01-28 DIAGNOSIS — K117 Disturbances of salivary secretion: Secondary | ICD-10-CM | POA: Insufficient documentation

## 2013-01-28 DIAGNOSIS — R768 Other specified abnormal immunological findings in serum: Secondary | ICD-10-CM

## 2013-01-28 DIAGNOSIS — R682 Dry mouth, unspecified: Principal | ICD-10-CM

## 2013-01-28 DIAGNOSIS — H04123 Dry eye syndrome of bilateral lacrimal glands: Secondary | ICD-10-CM

## 2013-01-28 LAB — TB SKIN TEST
Induration: 0 mm
TB Skin Test: NEGATIVE

## 2013-01-28 LAB — RHEUMATOID FACTOR: Rhuematoid fact SerPl-aCnc: <10 IU/mL (ref <=14)

## 2013-01-28 NOTE — Telephone Encounter (Signed)
Discussed results of labs with patient, including his CXR and the finding of ANA 1:1280, speckled pattern. Upon further ROS, he reports dry eyes and sensation of sand in eyes over the past 6 months, uses eye lubricant.  Endorses dry mouth, wakes up with very dry mouth and has to drink water.  Discussed plan for further lab workup, to be drawn today when he comes for PPD reading later this morning.  JB

## 2013-01-28 NOTE — Progress Notes (Signed)
Patient here today to have PPD site read.   PPD read and results entered in [facility]. Result: 0 mm induration. Interpretation: Negative Labs drawn per Dr. Lindell Noe orders. Tamika L.Hassell Done, RN

## 2013-01-28 NOTE — Progress Notes (Signed)
   Subjective:    Patient ID: Dustin Burton, male    DOB: 04/18/1950, 63 y.o.   MRN: [id number]  HPI    Review of Systems     Objective:   Physical Exam        Assessment & Plan:  RF,TSH AND SSA AND SSB LABS DONE TODAY [place]

## 2013-01-29 ENCOUNTER — Telehealth: Payer: Self-pay | Admitting: Family Medicine

## 2013-01-29 DIAGNOSIS — R768 Other specified abnormal immunological findings in serum: Secondary | ICD-10-CM

## 2013-01-29 LAB — TSH: TSH: 2.226 u[IU]/mL (ref 0.350–4.500)

## 2013-01-29 LAB — SJOGREN'S SYNDROME ANTIBODS(SSA + SSB): SSA (Ro) (ENA) Antibody, IgG: 6 AU/mL (ref <30)

## 2013-01-29 NOTE — Telephone Encounter (Signed)
Called patient to discuss labs, anti-SSA/SSB Abs negative. He feels more energy today and was able to take a bike ride today indoors.  Clinical appearance of Sjogrens, plan for referral to Dr Charlestine Night of Rheumatology for further consideration. JB

## 2013-02-01 NOTE — Telephone Encounter (Signed)
Will fwd rheumatology referral to [place], referral coordinator.  Korinna Tat, Loralyn [place], [place]

## 2013-02-05 ENCOUNTER — Ambulatory Visit (INDEPENDENT_AMBULATORY_CARE_PROVIDER_SITE_OTHER): Payer: 59 | Admitting: Family Medicine

## 2013-02-05 ENCOUNTER — Encounter: Payer: Self-pay | Admitting: Family Medicine

## 2013-02-05 VITALS — BP 140/81 | HR 67 | Ht 71.0 in | Wt 160.9 lb

## 2013-02-05 DIAGNOSIS — R894 Abnormal immunological findings in specimens from other organs, systems and tissues: Secondary | ICD-10-CM

## 2013-02-05 DIAGNOSIS — R768 Other specified abnormal immunological findings in serum: Secondary | ICD-10-CM

## 2013-02-05 NOTE — Patient Instructions (Signed)
It was a pleasure to see you today.  We will proceed with the plan of having Rheumatology evaluate before we decide on the utility of CT imaging.

## 2013-02-05 NOTE — Assessment & Plan Note (Signed)
Patient with positive ANA and associated xerostomia/fatigue/parotid gland swelling intermittently.  Referral made for Rheumatology to evaluate given clinical presentation c/w Sjogrens and positive ANA.  We discussed whether could be due to sialadenitis.  Will hold off on further workup until after Rheumatology evaluation, or if he is feeling worse in the interim.

## 2013-02-05 NOTE — Progress Notes (Signed)
   Subjective:    Patient ID: Dustin Burton, male    DOB: 1950/02/27, 63 y.o.   MRN: [id number]  HPI Dustin Burton here today for follow up; has been feeling better recently, no more night sweats and low-grade fevers, is able to work out on stationary bicycle and feels good.  Has interest in working out, tolerates well.  Has continued with L jaw swelling intermittently, better than it has been.   Still with sensation of burning in the L axilla, radiates to arm sometimes.  No chest pain or diaphoresis.  He is R hand dominant.   Social Hx: never-smoker, no alcohol use.    Review of Systems  Dryness in mouth and eyes is somewhat better than it was last week. COugh is resolved from the recent flu.  No headaches.       Objective:   Physical Exam Well appearing, no apparent distress HEENT Neck supple, no cervical adenopathy. No axillary adenopathy or skin changes. No frontal or maxillary sinus tenderness. PERRL. Clear oropharynx. TMs clear bilaterally. Nasal mucosa clear. Mild fullness and tenderness of L parotid gland; no erythema or discharge from duct.  Moist mucus membranes with intact dentition.  PULM Clear bilaterally, no rales or wheezes.  COR Regular S1S2, no extra sounds.        Assessment & Plan:

## 2013-02-09 NOTE — Telephone Encounter (Signed)
Called pt and informed. .Dustin Burton  

## 2013-02-09 NOTE — Telephone Encounter (Signed)
Pt has an appt on 03/01/2013 @2 :77 Dr. Charlestine Night ( Rheumatology)  Dr. Charlestine Night Office will sent appt inf and NP packet.  [place]

## 2013-02-17 ENCOUNTER — Encounter: Payer: Self-pay | Admitting: Family Medicine

## 2013-02-17 ENCOUNTER — Ambulatory Visit (INDEPENDENT_AMBULATORY_CARE_PROVIDER_SITE_OTHER): Payer: 59 | Admitting: Family Medicine

## 2013-02-17 VITALS — BP 132/62 | HR 71 | Temp 98.2°F | Ht 71.0 in | Wt 158.0 lb

## 2013-02-17 DIAGNOSIS — G629 Polyneuropathy, unspecified: Secondary | ICD-10-CM

## 2013-02-17 DIAGNOSIS — G609 Hereditary and idiopathic neuropathy, unspecified: Secondary | ICD-10-CM

## 2013-02-17 LAB — CBC WITH DIFFERENTIAL/PLATELET
BASOS ABS: 0 10*3/uL (ref 0.0–0.1)
BASOS PCT: 1 % (ref 0–1)
Eosinophils Absolute: 0.1 10*3/uL (ref 0.0–0.7)
Eosinophils Relative: 2 % (ref 0–5)
HCT: 40.8 % (ref 39.0–52.0)
HEMOGLOBIN: 13.8 g/dL (ref 13.0–17.0)
Lymphocytes Relative: 24 % (ref 12–46)
Lymphs Abs: 1.5 10*3/uL (ref 0.7–4.0)
MCH: 32.1 pg (ref 26.0–34.0)
MCHC: 33.8 g/dL (ref 30.0–36.0)
MCV: 94.9 fL (ref 78.0–100.0)
MONOS PCT: 11 % (ref 3–12)
Monocytes Absolute: 0.7 10*3/uL (ref 0.1–1.0)
NEUTROS ABS: 3.8 10*3/uL (ref 1.7–7.7)
NEUTROS PCT: 62 % (ref 43–77)
Platelets: 274 10*3/uL (ref 150–400)
RBC: 4.3 MIL/uL (ref 4.22–5.81)
RDW: 14.5 % (ref 11.5–15.5)
WBC: 6.1 10*3/uL (ref 4.0–10.5)

## 2013-02-17 LAB — COMPREHENSIVE METABOLIC PANEL
ALK PHOS: 64 U/L (ref 39–117)
ALT: 23 U/L (ref 0–53)
AST: 25 U/L (ref 0–37)
Albumin: 4.3 g/dL (ref 3.5–5.2)
BUN: 19 mg/dL (ref 6–23)
CO2: 30 mEq/L (ref 19–32)
Calcium: 9.3 mg/dL (ref 8.4–10.5)
Chloride: 104 mEq/L (ref 96–112)
Creat: 1.03 mg/dL (ref 0.50–1.35)
Glucose, Bld: 92 mg/dL (ref 70–99)
POTASSIUM: 4.2 meq/L (ref 3.5–5.3)
SODIUM: 138 meq/L (ref 135–145)
TOTAL PROTEIN: 6.9 g/dL (ref 6.0–8.3)
Total Bilirubin: 0.4 mg/dL (ref 0.2–1.2)

## 2013-02-17 NOTE — Progress Notes (Signed)
   Subjective:    Patient ID: Dustin Burton, male    DOB: 1950-02-15, 63 y.o.   MRN: [id number]  HPI Patient seen today acutely, has been having progressively worsening numbness and tingling on soles of both feet, palms bilaterally, and both sides of face.  Exclusively sensory disturbance, no weakness, dysarthria or dysphagia.  Has noticed the numbness in his feet to be worse at night, sometimes awakens him in bed.  May be barely noticeable when he is busy during the day.  Started to notice about 5 days ago, with gradual progression.    He reports that otherwise he has been feeling "better than in a few months".  Has resumed working out; no fevers, chills or sweats.  No cough or shortness of breath. No chest pain.  No abdominal pain, no nausea/vomiting.  Good appetite, eating balanced diet (reports eating vegetables, sweet potatoes and salmon).  No restrictive diets.  Has taken turmeric supplements and CoQ10, but stopped these recently when he started the tingling.   Review of Systems Recent influenza-like illness (end December); the L jaw/parotid swelling has resolved.  Continues with dry eyes that is worse in the evenings.  Dry mouth is better than previously.      Objective:   Physical Exam Generally well appearing, no apparent distress HEENT Neck supple, no cervical or supraclavicular adenopathy. Conjunctivae inflamed bilat.  MMM. Clear oropharynx, Stenson's duct not inflamed.  COR Regular S1S2, no extra sounds NEURO: Eyes close tightly. PERRL. EOMI. Bares teeth symmetrically. Tongue in midline. Sensation at three branches of CNV decreased symmetrically.  Handgrip full and symmetric bilaterally. Palpable radial pulses bilat, brisk cap refill in fingers of both hands. Sensory deficit symmetric in both hands, across all five digits.  Tinel test negative, Phalens positive.  Gait unremarkable. Palpable DP pulses bilaterally. Sensory deficit noted in both feet, more pronounced in the L foot than the  R.  Strength (dorsi-plantarflexion) intact and symmetric. Monofilament testing diminished more in the left foot than the right in all areas of the foot.  Marked hyperreflexia in both ankles, patellae, and brachioradialis bilaterally. 3-beat clonus bilaterally. Negative Chvostek's sign bilaterally.        Assessment & Plan:

## 2013-02-18 LAB — VITAMIN B12: VITAMIN B 12: 612 pg/mL (ref 211–911)

## 2013-02-18 LAB — FOLATE: Folate: >20 ng/mL

## 2013-02-19 ENCOUNTER — Telehealth: Payer: Self-pay | Admitting: Family Medicine

## 2013-02-19 ENCOUNTER — Ambulatory Visit
Admission: RE | Admit: 2013-02-19 | Discharge: 2013-02-19 | Disposition: A | Discharge status: Home / Self Care | Payer: 59 | Source: Ambulatory Visit | Attending: Family Medicine | Admitting: Family Medicine

## 2013-02-19 DIAGNOSIS — G629 Polyneuropathy, unspecified: Secondary | ICD-10-CM

## 2013-02-19 MED ORDER — GADOBENATE DIMEGLUMINE 529 MG/ML IV SOLN
14.0000 mL | Freq: Once | INTRAVENOUS | Status: AC | PRN
  Administered 2013-02-19: 14 mL via INTRAVENOUS

## 2013-02-19 NOTE — Telephone Encounter (Signed)
[phone] Called patient to report results of MRI brain, which is normal.  He has had more tingling in the arms since we saw each other Weds Jan 28; the next day his legs were more sore (after a hard cycling) without weakness.  His facial tingling persists.  Plan is for him to call back if worsening, certainly if he experiences true ascending weakness as this would raise concern for Guillain Barre in the setting of a recent influenza-like illness.  Dalbert Mayotte, MD

## 2013-02-22 ENCOUNTER — Telehealth: Payer: Self-pay | Admitting: Family Medicine

## 2013-02-22 ENCOUNTER — Ambulatory Visit (INDEPENDENT_AMBULATORY_CARE_PROVIDER_SITE_OTHER): Payer: 59 | Admitting: Family Medicine

## 2013-02-22 ENCOUNTER — Ambulatory Visit (HOSPITAL_COMMUNITY)
Admission: RE | Admit: 2013-02-22 | Discharge: 2013-02-22 | Disposition: A | Discharge status: Home / Self Care | Payer: 59 | Source: Ambulatory Visit | Attending: Family Medicine | Admitting: Family Medicine

## 2013-02-22 DIAGNOSIS — R002 Palpitations: Secondary | ICD-10-CM | POA: Insufficient documentation

## 2013-02-22 NOTE — Telephone Encounter (Signed)
Received the following email from patient this morning:  "I feel bad about sending these email and I wanted to let you know the latest.  Around 4:30 this morning my wife woke me.  She said I was "jerking" and when she asked me if I was ok I said I was having spasms up my back into my neck.  I do not remember this conversation though do remember waking and having heart palpitations. Remo Lipps said she felt my pulse and my heart was racing.  I also felt a strange electrical like charge in my upper back and neck and was very  nauseated.  This lasted for about 45 minutes.  I am still feeling mild palpitations and the strange back/neck feeling has gone away. I guess I am wondering if I need to be seen?"  I called patient, who reports feeling heart racing and some skipped beats this morning. Has had vibratory sensation in the chest over the weekend, has been focalized in the right upper back.  No chest pain or diaphoresis.  Energy level in general feels better; he worked out with a moderate-intensity bike ride Management consultant) for over an hour yesterday. No cardiac history. Regarding his sensory disturbances, the facial tingling is more pronounced, the UE sensory disturbances are less intense.  The paresthesias in the feet are unchanged, with the exception of sensation of swelling along the soles of his feet.  Plan for him to come in this morning for a RN visit with ECG, I will see him briefly and then we will decide on next steps based on ECG findings (to rule out dysrhythmias such as AF). Dalbert Mayotte, MD

## 2013-02-22 NOTE — Assessment & Plan Note (Signed)
No dysrhythmias on ECG.

## 2013-02-22 NOTE — Progress Notes (Signed)
   Subjective:    Patient ID: Dustin Burton, male    DOB: [birth year], 63 y.o.   MRN: [id number]  HPI Patient seen today by me in the RN clinic, see earlier phone note for details.  Feels like his heart is racing and irregular, with vibratory sensation across chest.  Felt this sensation during the ECG done in the office, today, which shows NSR with question of mild LVH, pulse 70.  He denies chest pain or pressure.  Not exertional.  The tingling in his face is more pronounced than previous, bilaterally symmetric.  No weakness in face, hands, feet or extremities.  After his 1hr 15 min workout on trainer yesterday, he felt better (more energy), but then felt fatigued in the evening. Resumed taking turmeric supplement over the weekend.  He and wife are planning trip to [facility] and [place], (Feb 4), he is looking forward to it. Denies anhedonia, but does feel "anxious" about the fluctuating nature of his symptoms.  Major stressor in his life is the care of his octogenarian parents, father with dementia and trying to transition them to a better environment of care (currently living in their own home).  He asks about chronic Lyme disease, has found ticks on himself but never engorged. Family friend in [place] had this and it came to his mind when trying to research his symptoms.   Plan to see Dr Truslow/Rheum on Monday, Feb 9th; we will postpone any action on possible Neurology consult, although that would be next step if paresthesias worsen or if he were to begin with motor weakness/involvement.    Review of Systems     Objective:   Physical Exam        Assessment & Plan:

## 2013-04-02 ENCOUNTER — Other Ambulatory Visit (INDEPENDENT_AMBULATORY_CARE_PROVIDER_SITE_OTHER): Payer: 59

## 2013-04-02 ENCOUNTER — Other Ambulatory Visit: Payer: Self-pay | Admitting: Internal Medicine

## 2013-04-02 DIAGNOSIS — R509 Fever, unspecified: Secondary | ICD-10-CM

## 2013-04-02 DIAGNOSIS — M25529 Pain in unspecified elbow: Secondary | ICD-10-CM

## 2013-04-02 DIAGNOSIS — G609 Hereditary and idiopathic neuropathy, unspecified: Secondary | ICD-10-CM

## 2013-04-02 LAB — C-REACTIVE PROTEIN: CRP: <0.5 mg/dL (ref 0.5–20.0)

## 2013-04-02 NOTE — Progress Notes (Signed)
appt scheduled

## 2013-04-05 LAB — ANTI-NUCLEAR AB-TITER (ANA TITER): ANA Titer 1: 1:320 {titer} — ABNORMAL HIGH

## 2013-04-05 LAB — ANA: Anti Nuclear Antibody(ANA): POSITIVE — AB

## 2013-04-05 LAB — B. BURGDORFI ANTIBODIES: B BURGDORFERI AB IGG+ IGM: 0.35 {ISR}

## 2013-04-05 LAB — ROCKY MTN SPOTTED FVR AB, IGG-BLOOD: RMSF IGG: 0.17 IV

## 2013-04-06 ENCOUNTER — Other Ambulatory Visit: Payer: Self-pay | Admitting: Internal Medicine

## 2013-04-06 DIAGNOSIS — G629 Polyneuropathy, unspecified: Secondary | ICD-10-CM

## 2013-04-06 DIAGNOSIS — M79606 Pain in leg, unspecified: Principal | ICD-10-CM

## 2013-04-06 DIAGNOSIS — M79603 Pain in arm, unspecified: Secondary | ICD-10-CM

## 2013-04-07 ENCOUNTER — Other Ambulatory Visit: Payer: Self-pay | Admitting: Internal Medicine

## 2013-04-07 ENCOUNTER — Ambulatory Visit (INDEPENDENT_AMBULATORY_CARE_PROVIDER_SITE_OTHER)
Admission: RE | Admit: 2013-04-07 | Discharge: 2013-04-07 | Disposition: A | Discharge status: Home / Self Care | Payer: 59 | Source: Ambulatory Visit | Attending: Internal Medicine | Admitting: Internal Medicine

## 2013-04-07 DIAGNOSIS — M79609 Pain in unspecified limb: Secondary | ICD-10-CM

## 2013-04-07 DIAGNOSIS — M79606 Pain in leg, unspecified: Principal | ICD-10-CM

## 2013-04-07 DIAGNOSIS — M79603 Pain in arm, unspecified: Secondary | ICD-10-CM

## 2013-04-07 DIAGNOSIS — R911 Solitary pulmonary nodule: Secondary | ICD-10-CM

## 2013-04-07 DIAGNOSIS — E7800 Pure hypercholesterolemia, unspecified: Secondary | ICD-10-CM

## 2013-04-08 ENCOUNTER — Other Ambulatory Visit (INDEPENDENT_AMBULATORY_CARE_PROVIDER_SITE_OTHER): Payer: 59

## 2013-04-08 DIAGNOSIS — E7800 Pure hypercholesterolemia, unspecified: Secondary | ICD-10-CM

## 2013-04-08 LAB — BASIC METABOLIC PANEL
BUN: 17 mg/dL (ref 6–23)
CALCIUM: 9.4 mg/dL (ref 8.4–10.5)
CO2: 24 mEq/L (ref 19–32)
Chloride: 105 mEq/L (ref 96–112)
Creatinine, Ser: 1.1 mg/dL (ref 0.4–1.5)
GFR: 73.38 mL/min (ref >60.00)
GLUCOSE: 86 mg/dL (ref 70–99)
Potassium: 3.6 mEq/L (ref 3.5–5.1)
Sodium: 139 mEq/L (ref 135–145)

## 2013-04-09 ENCOUNTER — Other Ambulatory Visit: Payer: Self-pay | Admitting: Internal Medicine

## 2013-04-09 ENCOUNTER — Ambulatory Visit (INDEPENDENT_AMBULATORY_CARE_PROVIDER_SITE_OTHER)
Admission: RE | Admit: 2013-04-09 | Discharge: 2013-04-09 | Disposition: A | Discharge status: Home / Self Care | Payer: 59 | Source: Ambulatory Visit | Attending: Internal Medicine | Admitting: Internal Medicine

## 2013-04-09 ENCOUNTER — Other Ambulatory Visit: Payer: 59

## 2013-04-09 DIAGNOSIS — R911 Solitary pulmonary nodule: Secondary | ICD-10-CM

## 2013-04-09 DIAGNOSIS — J438 Other emphysema: Secondary | ICD-10-CM

## 2013-04-09 DIAGNOSIS — J8410 Pulmonary fibrosis, unspecified: Secondary | ICD-10-CM

## 2013-04-09 MED ORDER — IOHEXOL 300 MG/ML  SOLN
80.0000 mL | Freq: Once | INTRAMUSCULAR | Status: AC | PRN
  Administered 2013-04-09: 80 mL via INTRAVENOUS

## 2013-04-09 NOTE — Progress Notes (Signed)
Patient is a 63 year old male who was referred to [place] for the courtesy of his wife for evaluation of a progressive peripheral neuropathy and a sense of body vibration and has progressed since January of this year. His evaluation has included an MRI of the brain which was normal appropriate screening for neuropathy which was negative except for the EMG and nerve conduction velocities which are pending.  Of note the only lab abnormality has been a speckled pattern ANA which was initially 1/320 and repeat was 1/160.  He has a history of prostate cancer but has not had night sweats or weight loss and his PSA remains at 0. He initially had a fever at the onset of his symptoms but since then has had no fever cough.  He carries a prior diagnosis of exercise induced asthma. Has been on Singulair and albuterol when necessary.  Both his parents were smokers but he has never smoked.   As a part of his evaluation a C-spine was obtained to rule out cervical spinal stenosis. He did have spurring and some space narrowing incidental finding of a pulmonary nodule that to a CT scan. The CT scan showed emphysematous changes with apical scarring and most probably the nodule represented a granuloma.   The development of moderately severe emphysematous changes in  a nonsmoker who exercises regularly raised the concern of an alpha-1 antitrypsin deficiency. Serum levels pulmonary functions and referal to pulmonary has been made.   The EMG and nerve conduction velocity remains in progress and if there  is evidence of diffuse upper and lower neuropathy I will refer him to neurology to evaluate for demyelinating disorder. However, this diagnosis remains secondary to the pulmonary workup

## 2013-04-13 ENCOUNTER — Other Ambulatory Visit: Payer: Self-pay | Admitting: Family Medicine

## 2013-04-13 DIAGNOSIS — M79606 Pain in leg, unspecified: Principal | ICD-10-CM

## 2013-04-13 DIAGNOSIS — M79603 Pain in arm, unspecified: Secondary | ICD-10-CM

## 2013-04-13 DIAGNOSIS — G629 Polyneuropathy, unspecified: Secondary | ICD-10-CM

## 2013-04-14 LAB — ALPHA-1-ANTITRYPSIN: A1 ANTITRYPSIN SER: 124 mg/dL (ref 83–199)

## 2013-04-16 ENCOUNTER — Other Ambulatory Visit: Payer: Self-pay | Admitting: *Deleted

## 2013-04-19 ENCOUNTER — Other Ambulatory Visit: Payer: Self-pay | Admitting: *Deleted

## 2013-04-19 MED ORDER — MONTELUKAST SODIUM 10 MG PO TABS: 10.0000 mg | ORAL_TABLET | Freq: Every day | ORAL | Status: DC

## 2013-04-30 ENCOUNTER — Encounter: Payer: Self-pay | Admitting: Internal Medicine

## 2013-05-18 ENCOUNTER — Institutional Professional Consult (permissible substitution): Payer: 59 | Admitting: Pulmonary Disease

## 2013-05-26 ENCOUNTER — Encounter: Payer: 59 | Admitting: Neurology

## 2013-05-28 ENCOUNTER — Ambulatory Visit (INDEPENDENT_AMBULATORY_CARE_PROVIDER_SITE_OTHER): Payer: 59 | Admitting: Pulmonary Disease

## 2013-05-28 ENCOUNTER — Encounter: Payer: Self-pay | Admitting: Pulmonary Disease

## 2013-05-28 VITALS — BP 134/66 | HR 74 | Ht 70.0 in | Wt 160.0 lb

## 2013-05-28 DIAGNOSIS — R0683 Snoring: Secondary | ICD-10-CM | POA: Insufficient documentation

## 2013-05-28 DIAGNOSIS — J438 Other emphysema: Secondary | ICD-10-CM

## 2013-05-28 DIAGNOSIS — R0609 Other forms of dyspnea: Secondary | ICD-10-CM

## 2013-05-28 DIAGNOSIS — R0989 Other specified symptoms and signs involving the circulatory and respiratory systems: Secondary | ICD-10-CM

## 2013-05-28 DIAGNOSIS — J479 Bronchiectasis, uncomplicated: Secondary | ICD-10-CM | POA: Insufficient documentation

## 2013-05-28 DIAGNOSIS — R9389 Abnormal findings on diagnostic imaging of other specified body structures: Secondary | ICD-10-CM

## 2013-05-28 MED ORDER — FLUTTER DEVI: Status: DC

## 2013-05-28 NOTE — Patient Instructions (Addendum)
We will arrange a sleep study  We will arrange a CT scan in 6 months If you get a cold, use the flutter valve 4-5 times a day (10 breaths each) If you get bronchitis (coughing up mucus) you need to be seen by a doctor and take an antibiotic (I recommend doxycycline) for a week  For the sinuses: Take zyrtec daily generic OK Take saline rinses twice a day Take nasacort two sprays each nostril daily  We will see you back in 6 months or sooner if needed

## 2013-05-28 NOTE — Assessment & Plan Note (Signed)
He has some fatigue, heavy snoring, micrognathia, and his wife has witnessed him gasping for breath in the middle the night. This is very worrisome for objective sleep apnea.  Plan: -Polysomnogram

## 2013-05-28 NOTE — Progress Notes (Signed)
Subjective:    Patient ID: Dustin Burton, male    DOB: Mar 26, 1950, 63 y.o.   MRN: [id number]  HPI  This is a very pleasant 63 year old male with no past medical history of lung problems he comes to my clinic today for evaluation of an abnormal CT chest. He says that he never had any respiratory infections or problems as a child but he did have a severe respiratory infection in December of 2014. He describes several days of fever, pain in his lungs, and a cough productive of copious amounts of dark green thick sputum. He never went to the doctor for this until several weeks after the illness had persisted. By that point, he was told that his lungs were clear and he did not need antibiotics.  He has been experiencing a rattling in his chest and in his neck with breathing off and on over the last several months. He also has had some burning in his throat and his the lungs with cold weather when he cycles. Because of this he has had several studies recently which included a chest image which showed an abnormality in the right upper lung. This led to a chest CT which showed bronchiectasis and pleural thickening in the right upper lobe.  He never smoked cigarettes.  His niece died of cystic fibrosis 3 years ago.  Currently, he does not have a cough which is productive but he has had a significant amount of a rattling sound in his chest and neck with breathing. According to his wife this is worse at night. She has also had witnessed apneas and heavy snoring. He does have some fatigue during the daytime.   Past Medical History  Diagnosis Date  . Cancer     PROSTATE  . Hemorrhoids   . Brachial plexitis   . Elevated LFTs 07/2001  . IBS (irritable bowel syndrome)      Family History  Problem Relation Age of Onset  . Hypertension Mother   . Hypertension Father   . Colon cancer Father   . Coronary artery disease Mother   . Crohn's disease Mother   . Crohn's disease Father   . Cystic fibrosis       niece     History   Social History  . Marital Status: Married    Spouse Name: N/A    Number of Children: N/A  . Years of Education: N/A   Occupational History  . [employer]     Retired   Social History Main Topics  . Smoking status: Never Smoker   . Smokeless tobacco: Never Used  . Alcohol Use: Yes     Comment: socially  . Drug Use: No  . Sexual Activity: Not on file   Other Topics Concern  . Not on file   Social History Narrative  . No narrative on file     Allergies  Allergen Reactions  . Tadalafil Other (See Comments)    Muscular leg pain     Outpatient Prescriptions Prior to Visit  Medication Sig Dispense Refill  . albuterol (PROAIR HFA) 108 (90 BASE) MCG/ACT inhaler 2 puffs 3o minutes before strenuous exercise, every 4 to 6 hours as needed for shortness of breath.  2 Inhaler  5  . atorvastatin (LIPITOR) 20 MG tablet Take 1 tablet (20 mg total) by mouth daily.  30 tablet  11  . montelukast (SINGULAIR) 10 MG tablet Take 1 tablet (10 mg total) by mouth at bedtime.  30 tablet  11  . Multiple Vitamin (MULTIVITAMIN) tablet Take 1 tablet by mouth daily.        . ondansetron (ZOFRAN ODT) 4 MG disintegrating tablet Take 1 tab, dissolve on the tongue every 6 hours as needed for nausea.  40 tablet  0  . sildenafil (VIAGRA) 25 MG tablet Take 25 mg by mouth daily as needed.        [name] omeprazole (PRILOSEC) 40 MG capsule Take one po nightly  30 capsule  3   Facility-Administered Medications Prior to Visit  Medication Dose Route Frequency Provider Last Rate Last Dose  . 0.9 %  sodium chloride infusion  500 mL Intravenous Continuous Lafayette Dragon, MD          Review of Systems  Constitutional: Negative for fever and unexpected weight change.  HENT: Negative for congestion, dental problem, ear pain, nosebleeds, postnasal drip, rhinorrhea, sinus pressure, sneezing, sore throat and trouble swallowing.   Eyes: Negative for redness and itching.   Respiratory: Positive for shortness of breath. Negative for cough, chest tightness and wheezing.   Cardiovascular: Positive for palpitations. Negative for leg swelling.  Gastrointestinal: Negative for nausea and vomiting.  Genitourinary: Negative for dysuria.  Musculoskeletal: Negative for joint swelling.  Skin: Negative for rash.  Neurological: Negative for headaches.  Hematological: Does not bruise/bleed easily.  Psychiatric/Behavioral: Negative for dysphoric mood. The patient is not nervous/anxious.        Objective:   Physical Exam Filed Vitals:   05/28/13 1049  BP: 134/66  Pulse: 74  Height: 5\' 10"  (1.778 m)  Weight: 160 lb (72.576 kg)  SpO2: 98%   Gen: well appearing, no acute distress HEENT: NCAT, mild micrognathia, PERRL, EOMi, OP clear, neck supple without masses PULM: Few crackles right upper lobe CV: RRR, no mgr, no JVD AB: BS+, soft, nontender, no hsm Ext: warm, no edema, no clubbing, no cyanosis Derm: no rash or skin breakdown Neuro: A&Ox4, CN II-XII intact, strength 5/5 in all 4 extremities        Assessment & Plan:   Bronchiectasis He has a focal area of bronchiectasis in the right upper lobe with some associated pleural thickening. There is also some pleural thickening on the left with a very small amount of left upper lobe bronchiectasis. My suspicion is that this is do to the respiratory infection he had back in the wintertime. Most likely he had pneumonia and never sought care for it.  The differential diagnosis of bronchiectasis does include genetic causes like cystic fibrosis which in his case is particularly interesting because his niece died of cystic fibrosis about 3 years ago. She was a blood relative. I do not strongly suspect an immune deficiency based on clinical history.  Fortunately, the bronchiectasis is localized and does not affect his lung function which was found to be completely normal today.  Plan: -Use flutter valve with respiratory  infections -If he has bronchitis and he needs to be treated with antibiotics immediately for least a week -Would obtain sputum culture when he has bronchitis  Abnormal chest CT I believe that this is due to an old infection. He is a never smoker so the likelihood of lung malignancy is low. However, I do agree with radiology that we should repeat a CT chest in 6 months to make sure that the small area of pleural first parenchymal thickening is not worsening.  Snoring He has some fatigue, heavy snoring, micrognathia, and his wife has witnessed him gasping for breath in  the middle the night. This is very worrisome for objective sleep apnea.  Plan: -Polysomnogram   Updated Medication List Outpatient Encounter Prescriptions as of 05/28/2013  Medication Sig  . albuterol (PROAIR HFA) 108 (90 BASE) MCG/ACT inhaler 2 puffs 3o minutes before strenuous exercise, every 4 to 6 hours as needed for shortness of breath.  [name] atorvastatin (LIPITOR) 20 MG tablet Take 1 tablet (20 mg total) by mouth daily.  . montelukast (SINGULAIR) 10 MG tablet Take 1 tablet (10 mg total) by mouth at bedtime.  . Multiple Vitamin (MULTIVITAMIN) tablet Take 1 tablet by mouth daily.    [name] omeprazole (PRILOSEC) 40 MG capsule Take 40 mg by mouth daily as needed.  . ondansetron (ZOFRAN ODT) 4 MG disintegrating tablet Take 1 tab, dissolve on the tongue every 6 hours as needed for nausea.  . Probiotic Product (PROBIOTIC DAILY PO) Take 1 capsule by mouth daily.  . sildenafil (VIAGRA) 25 MG tablet Take 25 mg by mouth daily as needed.    . [DISCONTINUED] omeprazole (PRILOSEC) 40 MG capsule Take one po nightly  . Respiratory Therapy Supplies (FLUTTER) DEVI Use as directed

## 2013-05-28 NOTE — Progress Notes (Signed)
PFT done today. 

## 2013-05-28 NOTE — Assessment & Plan Note (Signed)
I believe that this is due to an old infection. He is a never smoker so the likelihood of lung malignancy is low. However, I do agree with radiology that we should repeat a CT chest in 6 months to make sure that the small area of pleural first parenchymal thickening is not worsening.

## 2013-05-28 NOTE — Assessment & Plan Note (Signed)
He has a focal area of bronchiectasis in the right upper lobe with some associated pleural thickening. There is also some pleural thickening on the left with a very small amount of left upper lobe bronchiectasis. My suspicion is that this is do to the respiratory infection he had back in the wintertime. Most likely he had pneumonia and never sought care for it.  The differential diagnosis of bronchiectasis does include genetic causes like cystic fibrosis which in his case is particularly interesting because his niece died of cystic fibrosis about 3 years ago. She was a blood relative. I do not strongly suspect an immune deficiency based on clinical history.  Fortunately, the bronchiectasis is localized and does not affect his lung function which was found to be completely normal today.  Plan: -Use flutter valve with respiratory infections -If he has bronchitis and he needs to be treated with antibiotics immediately for least a week -Would obtain sputum culture when he has bronchitis

## 2013-05-29 LAB — PULMONARY FUNCTION TEST
DL/VA % PRED: 118 %
DL/VA: 5.48 ml/min/mmHg/L
DLCO unc % pred: 113 %
DLCO unc: 36.55 ml/min/mmHg
FEF 25-75 PRE: 2.12 L/s
FEF 25-75 Post: 2.82 L/sec
FEF2575-%Change-Post: 32 %
FEF2575-%PRED-POST: 100 %
FEF2575-%PRED-PRE: 75 %
FEV1-%Change-Post: 9 %
FEV1-%PRED-PRE: 97 %
FEV1-%Pred-Post: 106 %
FEV1-POST: 3.72 L
FEV1-PRE: 3.4 L
FEV1FVC-%Change-Post: 10 %
FEV1FVC-%Pred-Pre: 91 %
FEV6-%CHANGE-POST: 0 %
FEV6-%PRED-POST: 109 %
FEV6-%Pred-Pre: 110 %
FEV6-PRE: 4.9 L
FEV6-Post: 4.88 L
FEV6FVC-%Change-Post: 0 %
FEV6FVC-%Pred-Post: 104 %
FEV6FVC-%Pred-Pre: 104 %
FVC-%CHANGE-POST: 0 %
FVC-%PRED-POST: 105 %
FVC-%Pred-Pre: 106 %
FVC-Post: 4.9 L
FVC-Pre: 4.95 L
POST FEV1/FVC RATIO: 76 %
PRE FEV1/FVC RATIO: 69 %
Post FEV6/FVC ratio: 99 %
Pre FEV6/FVC Ratio: 99 %
RV % pred: 64 %
RV: 1.49 L
TLC % pred: 91 %
TLC: 6.39 L

## 2013-05-31 ENCOUNTER — Encounter: Payer: Self-pay | Admitting: Pulmonary Disease

## 2013-07-12 ENCOUNTER — Ambulatory Visit (INDEPENDENT_AMBULATORY_CARE_PROVIDER_SITE_OTHER): Payer: 59 | Admitting: Family Medicine

## 2013-07-12 ENCOUNTER — Encounter: Payer: Self-pay | Admitting: Family Medicine

## 2013-07-12 VITALS — BP 143/76 | HR 67 | Ht 71.0 in | Wt 154.0 lb

## 2013-07-12 DIAGNOSIS — M25559 Pain in unspecified hip: Secondary | ICD-10-CM

## 2013-07-12 DIAGNOSIS — M25552 Pain in left hip: Secondary | ICD-10-CM

## 2013-07-12 NOTE — Patient Instructions (Signed)
You have three conditions that are closely related: ischial bursitis, proximal hamstring and hip external rotator strain. I would rest from cycling between now and the race, take 2 weeks off afterwards. Focus on the stretches (pick at least 3, hold for 20-30 seconds, repeat 3 times, do twice a day). Hip side raises, standing hip rotations, hamstring curls, hamstring swings, and lunges 3 sets of 10 once a day. Add ankle weight if these become too easy. Ice or heat 15 minutes at a time (whichever feels better - heat usually helps spasms but ice helps with swelling, it's debatable on if one is better than the other). Tylenol 500mg  1-2 tabs three times a day. Ok to take ibuprofen 600mg  three times a day with food inaddition to this but it can irritate your stomach. Consider physical therapy as well. Follow up with me in 3-4 weeks or as needed. Usually after 3 weeks from now would incorporate stationary (or level ground) cycling at about 50% at most what you usually do and increase by 10% per week while you continue your exercises.

## 2013-07-13 ENCOUNTER — Ambulatory Visit (HOSPITAL_BASED_OUTPATIENT_CLINIC_OR_DEPARTMENT_OTHER): Payer: 59 | Attending: Pulmonary Disease | Admitting: Sleep Medicine

## 2013-07-13 VITALS — Ht 70.0 in | Wt 160.0 lb

## 2013-07-13 DIAGNOSIS — G4710 Hypersomnia, unspecified: Secondary | ICD-10-CM | POA: Insufficient documentation

## 2013-07-13 DIAGNOSIS — G4733 Obstructive sleep apnea (adult) (pediatric): Secondary | ICD-10-CM

## 2013-07-13 DIAGNOSIS — G4730 Sleep apnea, unspecified: Principal | ICD-10-CM

## 2013-07-13 DIAGNOSIS — J479 Bronchiectasis, uncomplicated: Secondary | ICD-10-CM

## 2013-07-20 ENCOUNTER — Encounter: Payer: Self-pay | Admitting: Family Medicine

## 2013-07-20 DIAGNOSIS — M25552 Pain in left hip: Secondary | ICD-10-CM | POA: Insufficient documentation

## 2013-07-20 NOTE — Progress Notes (Signed)
Patient ID: Dustin Burton, male   DOB: December 06, 1950, 63 y.o.   MRN: [id number]  PCP: Willeen Niece, MD  Subjective:   HPI: Patient is a 63 y.o. male here for left hip pain.  Patient reports he has been training for 6 months for a 100 mile bike ride. Over past 1-2 weeks has started to feel a catching sensation in left buttock. Then since Saturday pain has worsened, started radiating into left groin and quad. Pain is sharp and stabbing. Uncomfortable sitting in a chair. Left leg goes numb at times. Had been up to 200 miles per week of cycling. Tried altering seat position without much benefit. No bowel/bladder dysfunction.  Past Medical History  Diagnosis Date  . Cancer     PROSTATE  . Hemorrhoids   . Brachial plexitis   . Elevated LFTs 07/2001  . IBS (irritable bowel syndrome)     Current Outpatient Prescriptions on File Prior to Visit  Medication Sig Dispense Refill  . albuterol (PROAIR HFA) 108 (90 BASE) MCG/ACT inhaler 2 puffs 3o minutes before strenuous exercise, every 4 to 6 hours as needed for shortness of breath.  2 Inhaler  5  . atorvastatin (LIPITOR) 20 MG tablet Take 1 tablet (20 mg total) by mouth daily.  30 tablet  11  . montelukast (SINGULAIR) 10 MG tablet Take 1 tablet (10 mg total) by mouth at bedtime.  30 tablet  11  . Multiple Vitamin (MULTIVITAMIN) tablet Take 1 tablet by mouth daily.        [name] omeprazole (PRILOSEC) 40 MG capsule Take 40 mg by mouth daily as needed.      . ondansetron (ZOFRAN ODT) 4 MG disintegrating tablet Take 1 tab, dissolve on the tongue every 6 hours as needed for nausea.  40 tablet  0  . Probiotic Product (PROBIOTIC DAILY PO) Take 1 capsule by mouth daily.      [name] Respiratory Therapy Supplies (FLUTTER) DEVI Use as directed  1 each  0  . sildenafil (VIAGRA) 25 MG tablet Take 25 mg by mouth daily as needed.         Current Facility-Administered Medications on File Prior to Visit  Medication Dose Route Frequency Provider Last Rate Last Dose  .  0.9 %  sodium chloride infusion  500 mL Intravenous Continuous Lafayette Dragon, MD        Past Surgical History  Procedure Laterality Date  . Prostatectomy    . Hand surgery      left index finger  . Knee surgery      right    Allergies  Allergen Reactions  . Tadalafil Other (See Comments)    Muscular leg pain    History   Social History  . Marital Status: Married    Spouse Name: N/A    Number of Children: N/A  . Years of Education: N/A   Occupational History  . [employer]     Retired   Social History Main Topics  . Smoking status: Never Smoker   . Smokeless tobacco: Never Used  . Alcohol Use: Yes     Comment: socially  . Drug Use: No  . Sexual Activity: Not on file   Other Topics Concern  . Not on file   Social History Narrative  . No narrative on file    Family History  Problem Relation Age of Onset  . Hypertension Mother   . Hypertension Father   . Colon cancer Father   .  Coronary artery disease Mother   . Crohn's disease Mother   . Crohn's disease Father   . Cystic fibrosis      niece    BP 143/76  Pulse 67  Ht 5\' 11"  (1.803 m)  Wt 154 lb (69.854 kg)  BMI 21.49 kg/m2  Review of Systems: See HPI above.    Objective:  Physical Exam:  Gen: NAD  Back/left hip: No gross deformity, scoliosis. TTP within left buttock, over ischial tuberosity, proximal hamstring.  No midline or bony TTP. FROM without pain in back or hip on passive or active non-resisted motions. Strength LEs 5/5 all muscle groups.  Mild pain with resisted hip abduction, knee flexion at 30 degrees. Negative SLRs. Sensation intact to light touch bilaterally. Negative logroll bilateral hips Negative fabers and piriformis stretches.    Assessment & Plan:  1. Left hip pain - consistent with a combination of ischial bursitis, proximal hamstring and hip external rotator strain.  Advised rest from cycling until race - focus on stretching, strengthening  which were reviewed today.  Tylenol, nsaids as needed.  Consider formal physical therapy.  Ice/heat as needed.  Follow up with me in 3-4 weeks or as needed.  Discussed return to cycling protocol after a rest period after his race.

## 2013-07-20 NOTE — Assessment & Plan Note (Signed)
consistent with a combination of ischial bursitis, proximal hamstring and hip external rotator strain.  Advised rest from cycling until race - focus on stretching, strengthening which were reviewed today.  Tylenol, nsaids as needed.  Consider formal physical therapy.  Ice/heat as needed.  Follow up with me in 3-4 weeks or as needed.  Discussed return to cycling protocol after a rest period after his race.

## 2013-07-21 DIAGNOSIS — G4710 Hypersomnia, unspecified: Secondary | ICD-10-CM

## 2013-07-21 DIAGNOSIS — G4730 Sleep apnea, unspecified: Secondary | ICD-10-CM

## 2013-07-21 NOTE — Sleep Study (Signed)
   NAME: Dustin Burton DATE OF BIRTH:  1951-01-18 MEDICAL RECORD NUMBER [id number]  LOCATION: [facility]  PHYSICIAN: Kathee Delton  DATE OF STUDY: 07/13/2013  SLEEP STUDY TYPE: Nocturnal Polysomnogram               REFERRING PHYSICIAN: Juanito Doom, MD  INDICATION FOR STUDY: Hypersomnia with sleep apnea  EPWORTH SLEEPINESS SCORE:  15 HEIGHT: 5\' 10"  (177.8 cm)  WEIGHT: 160 lb (72.576 kg)    Body mass index is 22.96 kg/(m^2).  NECK SIZE: 15 in.  MEDICATIONS: Reviewed in the sleep record  SLEEP ARCHITECTURE: The patient had a total sleep time of 383 minutes with decreased slow-wave sleep and adequate quantity of REM for age. Sleep onset latency was normal at 27 minutes, and REM onset was normal at 79 minutes. Sleep efficiency was mildly reduced at 84% the  RESPIRATORY DATA: The patient was found to have 16 obstructive and central apneas as well as 2 obstructive hypopneas, this gave him an AHI of only 3 of events per hour. The events occurred in all body positions, and there was moderate snoring noted throughout.  OXYGEN DATA: There was oxygen desaturation as low as 92% with the patient's events.  CARDIAC DATA: Occasional PVC noted  MOVEMENT/PARASOMNIA: Small numbers of periodic limb movements with no significant sleep disruption seen. There were no behavioral abnormalities.  IMPRESSION/ RECOMMENDATION:    1) small numbers of obstructive and central events which do not meet the AHI criteria for the obstructive or central sleep apnea syndrome. The patient did have moderate snoring noted throughout.  2) occasional PVC noted, but no clinically significant arrhythmias were seen.     Kathee Delton Diplomate, American Board of Sleep Medicine  ELECTRONICALLY SIGNED ON:  07/21/2013, 6:17 PM [place] PH: [phone]   FX: [phone] [place]

## 2013-07-22 ENCOUNTER — Telehealth: Payer: Self-pay

## 2013-07-22 NOTE — Telephone Encounter (Signed)
A,  Please let him know that his polysomnogram was normal.  Thanks B ----------------------------------------  lmtcb X1 to relay results to pt.

## 2013-07-22 NOTE — Telephone Encounter (Signed)
Spoke with pt, he is aware of results.  Nothing further needed at this time.  

## 2013-08-12 ENCOUNTER — Ambulatory Visit (INDEPENDENT_AMBULATORY_CARE_PROVIDER_SITE_OTHER): Payer: 59 | Admitting: Neurology

## 2013-08-12 DIAGNOSIS — G629 Polyneuropathy, unspecified: Secondary | ICD-10-CM

## 2013-08-12 DIAGNOSIS — M79606 Pain in leg, unspecified: Principal | ICD-10-CM

## 2013-08-12 DIAGNOSIS — M79609 Pain in unspecified limb: Secondary | ICD-10-CM

## 2013-08-12 DIAGNOSIS — M79603 Pain in arm, unspecified: Secondary | ICD-10-CM

## 2013-08-12 NOTE — Procedures (Signed)
[facility]  [address]  [place], [place] [address] Tel: [phone] Fax:  [phone] Test Date:  08/12/2013  Patient: Dustin Burton DOB: [birth year] Physician: Narda Amber, DO  Sex: Male Height: 5\' 11"  Ref Phys: Benay Pillow  ID#: [id number] Temp: 34.6C Technician:    Patient Complaints: This is a 63 year-old gentleman with presenting for evaluation of numbness/tingling the face, hands, and feet, following a viral infection.  NCV & EMG Findings: Extensive electrodiagnostic evaluation of the left upper and lower extremity shows:  1. Median, ulnar, and radial sensory responses are within normal limits. The left median/ulnar (palm) comparison nerve showed prolonged distal peak latency (Ulnar Palm, 2.3 ms). 2. Evaluation of the left ulnar motor nerve showed decreased conduction velocity (A Elbow-B Elbow, 43 m/s).  The median motor responses within normal limits. 3. Sural and superficial peroneal sensory responses are within normal limits. 4. Tibial and peroneal motor responses are within normal limits. 5. There is no evidence of active or chronic motor axon loss changes involving any of the tested   Impression: 1. Right ulnar neuropathy with slowing across the elbow, purely demyelinating in type. 2. There is no evidence of carpal tunnel syndrome, a generalized sensorimotor polyneuropathy, cervical radiculopathy, or lumbosacral radiculopathy affecting the left side.   ___________________________ Narda Amber, DO    Nerve Conduction Studies Anti Sensory Summary Table   Site NR Peak (ms) Norm Peak (ms) P-T Amp (V) Norm P-T Amp  Left Median Anti Sensory (2nd Digit)  Wrist    3.3 <3.8 28.2 >10  Left Radial Anti Sensory (Base 1st Digit)  Wrist    2.6 <2.8 30.5 >10  Left Sup Peroneal Anti Sensory (Ant Lat Mall)  12 cm    2.8 <4.6 7.4 >3  Left Sural Anti Sensory (Lat Mall)  Calf    4.1 <4.6 6.7 >3  Left Ulnar Anti Sensory (5th Digit)  Wrist    3.2 <3.2  19.1 >5   Motor Summary Table   Site NR Onset (ms) Norm Onset (ms) O-P Amp (mV) Norm O-P Amp Site1 Site2 Delta-0 (ms) Dist (cm) Vel (m/s) Norm Vel (m/s)  Left Median Motor (Abd Poll Brev)  Wrist    3.4 <4.0 6.9 >5 Elbow Wrist 5.3 29.0 55 >50  Elbow    8.7  6.8         Left Peroneal Motor (Ext Dig Brev)  Ankle    3.4 <6.0 4.7 >2.5 B Fib Ankle 7.8 36.0 46 >40  B Fib    11.2  4.1  Poplt B Fib 2.1 9.0 43 >40  Poplt    13.3  4.0         Left Peroneal TA Motor (Tib Ant)  Fib Head    3.3 <4.5 5.2 >3 Poplit Fib Head 1.6 9.0 56 >40  Poplit    4.9  5.2         Left Tibial Motor (Abd Hall Brev)  Ankle    4.5 <6.0 7.9 >4 Knee Ankle 8.7 41.0 47 >40  Knee    13.2  6.2         Left Ulnar Motor (Abd Dig Minimi)  Wrist    3.1 <3.1 9.7 >7 B Elbow Wrist 4.3 26.0 60 >50  B Elbow    7.4  9.0  A Elbow B Elbow 2.3 10.0 43 >50  A Elbow    9.7  8.1          Comparison Summary Table  Site NR Peak (ms) Norm Peak (ms) P-T Amp (V) Site1 Site2 Delta-P (ms) Norm Delta (ms)  Left Median/Ulnar Palm Comparison (Wrist - 8cm)  Median Palm    2.0 <2.2 67.4 Median Palm Ulnar Palm 0.3   Ulnar Palm    2.3 <2.2 20.3       EMG   Side Muscle Ins Act Fibs Psw Fasc Number Recrt Dur Dur. Amp Amp. Poly Poly. Comment  Left 1stDorInt Nml Nml Nml Nml Nml Nml Nml Nml Nml Nml Nml Nml N/A  Left Ext Indicis Nml Nml Nml Nml Nml Nml Nml Nml Nml Nml Nml Nml N/A  Left PronatorTeres Nml Nml Nml Nml Nml Nml Nml Nml Nml Nml Nml Nml N/A  Left Biceps Nml Nml Nml Nml Nml Nml Nml Nml Nml Nml Nml Nml N/A  Left Triceps Nml Nml Nml Nml Nml Nml Nml Nml Nml Nml Nml Nml N/A  Left Deltoid Nml Nml Nml Nml Nml Nml Nml Nml Nml Nml Nml Nml N/A  Left AntTibialis Nml Nml Nml Nml Nml Nml Nml Nml Nml Nml Nml Nml N/A  Left Gastroc Nml Nml Nml Nml Nml Nml Nml Nml Nml Nml Nml Nml N/A  Left Flex Dig Long Nml Nml Nml Nml Nml Nml Nml Nml Nml Nml Nml Nml N/A  Left RectFemoris Nml Nml Nml Nml Nml Nml Nml Nml Nml Nml Nml Nml N/A  Left GluteusMed Nml Nml Nml  Nml Nml Nml Nml Nml Nml Nml Nml Nml N/A      Waveforms:

## 2013-08-16 ENCOUNTER — Telehealth: Payer: Self-pay | Admitting: Internal Medicine

## 2013-08-16 NOTE — Telephone Encounter (Signed)
For the last few weeks, he has been having abdominal cramping and multiple loose stools(5-6/day). Hx IBS and lymphocytic colitis. Scheduled with Dr. Olevia Perches on 08/20/13 at 2:15 PM.

## 2013-08-19 ENCOUNTER — Telehealth: Payer: Self-pay | Admitting: Family Medicine

## 2013-08-19 NOTE — Telephone Encounter (Signed)
Pt called to ask about his nerve study results . Would like a call back about results.

## 2013-08-20 ENCOUNTER — Ambulatory Visit: Payer: 59 | Admitting: Internal Medicine

## 2013-09-03 ENCOUNTER — Encounter: Payer: Self-pay | Admitting: Family Medicine

## 2013-09-03 ENCOUNTER — Ambulatory Visit (INDEPENDENT_AMBULATORY_CARE_PROVIDER_SITE_OTHER): Payer: 59 | Admitting: Family Medicine

## 2013-09-03 VITALS — BP 126/66 | HR 78 | Temp 97.9°F | Ht 70.0 in | Wt 157.3 lb

## 2013-09-03 DIAGNOSIS — R61 Generalized hyperhidrosis: Secondary | ICD-10-CM

## 2013-09-03 DIAGNOSIS — Z111 Encounter for screening for respiratory tuberculosis: Secondary | ICD-10-CM

## 2013-09-03 DIAGNOSIS — J479 Bronchiectasis, uncomplicated: Secondary | ICD-10-CM

## 2013-09-03 DIAGNOSIS — K52832 Lymphocytic colitis: Secondary | ICD-10-CM

## 2013-09-03 DIAGNOSIS — K5289 Other specified noninfective gastroenteritis and colitis: Secondary | ICD-10-CM

## 2013-09-03 DIAGNOSIS — Z1159 Encounter for screening for other viral diseases: Secondary | ICD-10-CM

## 2013-09-03 DIAGNOSIS — Z23 Encounter for immunization: Secondary | ICD-10-CM

## 2013-09-03 MED ORDER — ALBUTEROL SULFATE HFA 108 (90 BASE) MCG/ACT IN AERS: INHALATION_SPRAY | RESPIRATORY_TRACT | Status: DC

## 2013-09-03 MED ORDER — OMEPRAZOLE 40 MG PO CPDR: 40.0000 mg | DELAYED_RELEASE_CAPSULE | Freq: Every day | ORAL | Status: DC | PRN

## 2013-09-03 MED ORDER — ATORVASTATIN CALCIUM 40 MG PO TABS: 40.0000 mg | ORAL_TABLET | Freq: Every day | ORAL | Status: DC

## 2013-09-03 MED ORDER — ASPIRIN EC 81 MG PO TBEC: 81.0000 mg | DELAYED_RELEASE_TABLET | Freq: Every day | ORAL | Status: DC
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
## 2013-09-03 NOTE — Assessment & Plan Note (Signed)
To follow up for colitis according to Dr Nichola Sizer instructions, or sooner as dictated by symptoms.

## 2013-09-03 NOTE — Patient Instructions (Signed)
It was a pleasure to see you today. I am glad things are going well.   Today you are receiving the Tdap (tetanus/pertussis) vaccine, and the pneumococcal vaccine.  You are recommended to receive the influenza vaccine when it becomes available (Sept).  We will have this in our office.  We increased your atorvastatin to 40mg /day today, based on risk factor calculator.  Add aspirin 81mg  daily for heart protection.   I will plan to see you back in about 6 months, or sooner as needed.

## 2013-09-03 NOTE — Assessment & Plan Note (Signed)
Relatively quiescent at this time. Has been followed by Pulmonology and has another follow up CT chest ordered for November 2nd, to follow up on nodularity that is thought to be related to old infection.   To administer pneumococcal vaccine today; patient advised to receive flu vaccine when first becomes available in our office in September.

## 2013-10-06 ENCOUNTER — Other Ambulatory Visit: Payer: Self-pay | Admitting: *Deleted

## 2013-10-08 ENCOUNTER — Other Ambulatory Visit: Payer: Self-pay | Admitting: *Deleted

## 2013-10-08 MED ORDER — MONTELUKAST SODIUM 10 MG PO TABS: 10.0000 mg | ORAL_TABLET | Freq: Every day | ORAL | Status: DC

## 2013-10-08 MED ORDER — ATORVASTATIN CALCIUM 40 MG PO TABS: 40.0000 mg | ORAL_TABLET | Freq: Every day | ORAL | Status: DC

## 2013-11-04 ENCOUNTER — Encounter (HOSPITAL_BASED_OUTPATIENT_CLINIC_OR_DEPARTMENT_OTHER): Payer: Self-pay | Admitting: Emergency Medicine

## 2013-11-04 ENCOUNTER — Emergency Department (HOSPITAL_BASED_OUTPATIENT_CLINIC_OR_DEPARTMENT_OTHER): Payer: 59

## 2013-11-04 ENCOUNTER — Emergency Department (HOSPITAL_COMMUNITY)
Admission: EM | Admit: 2013-11-04 | Discharge: 2013-11-04 | Discharge status: Absent without leave | Payer: 59 | Source: Home / Self Care

## 2013-11-04 ENCOUNTER — Emergency Department (HOSPITAL_BASED_OUTPATIENT_CLINIC_OR_DEPARTMENT_OTHER)
Admission: EM | Admit: 2013-11-04 | Discharge: 2013-11-04 | Disposition: A | Discharge status: Home / Self Care | Payer: 59 | Attending: Emergency Medicine | Admitting: Emergency Medicine

## 2013-11-04 DIAGNOSIS — Y9289 Other specified places as the place of occurrence of the external cause: Secondary | ICD-10-CM | POA: Insufficient documentation

## 2013-11-04 DIAGNOSIS — Y9355 Activity, bike riding: Secondary | ICD-10-CM | POA: Insufficient documentation

## 2013-11-04 DIAGNOSIS — R55 Syncope and collapse: Secondary | ICD-10-CM | POA: Diagnosis not present

## 2013-11-04 DIAGNOSIS — S0990XA Unspecified injury of head, initial encounter: Secondary | ICD-10-CM | POA: Insufficient documentation

## 2013-11-04 DIAGNOSIS — Z8719 Personal history of other diseases of the digestive system: Secondary | ICD-10-CM | POA: Insufficient documentation

## 2013-11-04 DIAGNOSIS — Z8669 Personal history of other diseases of the nervous system and sense organs: Secondary | ICD-10-CM | POA: Insufficient documentation

## 2013-11-04 DIAGNOSIS — E780 Pure hypercholesterolemia: Secondary | ICD-10-CM | POA: Diagnosis not present

## 2013-11-04 DIAGNOSIS — S80212A Abrasion, left knee, initial encounter: Secondary | ICD-10-CM | POA: Diagnosis not present

## 2013-11-04 DIAGNOSIS — Z7982 Long term (current) use of aspirin: Secondary | ICD-10-CM | POA: Insufficient documentation

## 2013-11-04 DIAGNOSIS — Z8546 Personal history of malignant neoplasm of prostate: Secondary | ICD-10-CM | POA: Insufficient documentation

## 2013-11-04 DIAGNOSIS — Z79899 Other long term (current) drug therapy: Secondary | ICD-10-CM | POA: Insufficient documentation

## 2013-11-04 DIAGNOSIS — Z8709 Personal history of other diseases of the respiratory system: Secondary | ICD-10-CM | POA: Diagnosis not present

## 2013-11-04 HISTORY — DX: Pure hypercholesterolemia, unspecified: E78.00

## 2013-11-04 HISTORY — DX: Other seasonal allergic rhinitis: J30.2

## 2013-11-04 MED ORDER — ONDANSETRON 4 MG PO TBDP
4.0000 mg | ORAL_TABLET | Freq: Once | ORAL | Status: AC
  Administered 2013-11-04: 4 mg via ORAL
  Filled 2013-11-04: qty 1

## 2013-11-04 MED ORDER — HYDROCODONE-ACETAMINOPHEN 5-325 MG PO TABS: 1.0000 | ORAL_TABLET | ORAL | Status: DC | PRN

## 2013-11-04 MED ORDER — ONDANSETRON 4 MG PO TBDP: 4.0000 mg | ORAL_TABLET | ORAL | Status: DC | PRN

## 2013-11-04 MED ORDER — HYDROCODONE-ACETAMINOPHEN 5-325 MG PO TABS
2.0000 | ORAL_TABLET | Freq: Once | ORAL | Status: AC
  Administered 2013-11-04: 2 via ORAL
  Filled 2013-11-04: qty 2
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
## 2013-11-04 NOTE — ED Notes (Signed)
C-collar placed by [facility] EMT
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
## 2013-11-04 NOTE — Discharge Instructions (Signed)
Concussion  A concussion, or closed-head injury, is a brain injury caused by a direct blow to the head or by a quick and sudden movement (jolt) of the head or neck. Concussions are usually not life-threatening. Even so, the effects of a concussion can be serious. If you have had a concussion before, you are more likely to experience concussion-like symptoms after a direct blow to the head.   CAUSES  · Direct blow to the head, such as from running into another player during a soccer game, being hit in a fight, or hitting your head on a hard surface.  · A jolt of the head or neck that causes the brain to move back and forth inside the skull, such as in a car crash.  SIGNS AND SYMPTOMS  The signs of a concussion can be hard to notice. Early on, they may be missed by you, family members, and health care providers. You may look fine but act or feel differently.  Symptoms are usually temporary, but they may last for days, weeks, or even longer. Some symptoms may appear right away while others may not show up for hours or days. Every head injury is different. Symptoms include:  · Mild to moderate headaches that will not go away.  · A feeling of pressure inside your head.  · Having more trouble than usual:  ¨ Learning or remembering things you have heard.  ¨ Answering questions.  ¨ Paying attention or concentrating.  ¨ Organizing daily tasks.  ¨ Making decisions and solving problems.  · Slowness in thinking, acting or reacting, speaking, or reading.  · Getting lost or being easily confused.  · Feeling tired all the time or lacking energy (fatigued).  · Feeling drowsy.  · Sleep disturbances.  ¨ Sleeping more than usual.  ¨ Sleeping less than usual.  ¨ Trouble falling asleep.  ¨ Trouble sleeping (insomnia).  · Loss of balance or feeling lightheaded or dizzy.  · Nausea or vomiting.  · Numbness or tingling.  · Increased sensitivity to:  ¨ Sounds.  ¨ Lights.  ¨ Distractions.  · Vision problems or eyes that tire  easily.  · Diminished sense of taste or smell.  · Ringing in the ears.  · Mood changes such as feeling sad or anxious.  · Becoming easily irritated or angry for little or no reason.  · Lack of motivation.  · Seeing or hearing things other people do not see or hear (hallucinations).  DIAGNOSIS  Your health care provider can usually diagnose a concussion based on a description of your injury and symptoms. He or she will ask whether you passed out (lost consciousness) and whether you are having trouble remembering events that happened right before and during your injury.  Your evaluation might include:  · A brain scan to look for signs of injury to the brain. Even if the test shows no injury, you may still have a concussion.  · Blood tests to be sure other problems are not present.  TREATMENT  · Concussions are usually treated in an emergency department, in urgent care, or at a clinic. You may need to stay in the hospital overnight for further treatment.  · Tell your health care provider if you are taking any medicines, including prescription medicines, over-the-counter medicines, and natural remedies. Some medicines, such as blood thinners (anticoagulants) and aspirin, may increase the chance of complications. Also tell your health care provider whether you have had alcohol or are taking illegal drugs. This information   may affect treatment.  · Your health care provider will send you home with important instructions to follow.  · How fast you will recover from a concussion depends on many factors. These factors include how severe your concussion is, what part of your brain was injured, your age, and how healthy you were before the concussion.  · Most people with mild injuries recover fully. Recovery can take time. In general, recovery is slower in older persons. Also, persons who have had a concussion in the past or have other medical problems may find that it takes longer to recover from their current injury.  HOME  CARE INSTRUCTIONS  General Instructions  · Carefully follow the directions your health care provider gave you.  · Only take over-the-counter or prescription medicines for pain, discomfort, or fever as directed by your health care provider.  · Take only those medicines that your health care provider has approved.  · Do not drink alcohol until your health care provider says you are well enough to do so. Alcohol and certain other drugs may slow your recovery and can put you at risk of further injury.  · If it is harder than usual to remember things, write them down.  · If you are easily distracted, try to do one thing at a time. For example, do not try to watch TV while fixing dinner.  · Talk with family members or close friends when making important decisions.  · Keep all follow-up appointments. Repeated evaluation of your symptoms is recommended for your recovery.  · Watch your symptoms and tell others to do the same. Complications sometimes occur after a concussion. Older adults with a brain injury may have a higher risk of serious complications, such as a blood clot on the brain.  · Tell your teachers, school nurse, school counselor, coach, athletic trainer, or work manager about your injury, symptoms, and restrictions. Tell them about what you can or cannot do. They should watch for:  ¨ Increased problems with attention or concentration.  ¨ Increased difficulty remembering or learning new information.  ¨ Increased time needed to complete tasks or assignments.  ¨ Increased irritability or decreased ability to cope with stress.  ¨ Increased symptoms.  · Rest. Rest helps the brain to heal. Make sure you:  ¨ Get plenty of sleep at night. Avoid staying up late at night.  ¨ Keep the same bedtime hours on weekends and weekdays.  ¨ Rest during the day. Take daytime naps or rest breaks when you feel tired.  · Limit activities that require a lot of thought or concentration. These include:  ¨ Doing homework or job-related  work.  ¨ Watching TV.  ¨ Working on the computer.  · Avoid any situation where there is potential for another head injury (football, hockey, soccer, basketball, martial arts, downhill snow sports and horseback riding). Your condition will get worse every time you experience a concussion. You should avoid these activities until you are evaluated by the appropriate follow-up health care providers.  Returning To Your Regular Activities  You will need to return to your normal activities slowly, not all at once. You must give your body and brain enough time for recovery.  · Do not return to sports or other athletic activities until your health care provider tells you it is safe to do so.  · Ask your health care provider when you can drive, ride a bicycle, or operate heavy machinery. Your ability to react may be slower after a   brain injury. Never do these activities if you are dizzy.  · Ask your health care provider about when you can return to work or school.  Preventing Another Concussion  It is very important to avoid another brain injury, especially before you have recovered. In rare cases, another injury can lead to permanent brain damage, brain swelling, or death. The risk of this is greatest during the first 7-10 days after a head injury. Avoid injuries by:  · Wearing a seat belt when riding in a car.  · Drinking alcohol only in moderation.  · Wearing a helmet when biking, skiing, skateboarding, skating, or doing similar activities.  · Avoiding activities that could lead to a second concussion, such as contact or recreational sports, until your health care provider says it is okay.  · Taking safety measures in your home.  ¨ Remove clutter and tripping hazards from floors and stairways.  ¨ Use grab bars in bathrooms and handrails by stairs.  ¨ Place non-slip mats on floors and in bathtubs.  ¨ Improve lighting in dim areas.  SEEK MEDICAL CARE IF:  · You have increased problems paying attention or  concentrating.  · You have increased difficulty remembering or learning new information.  · You need more time to complete tasks or assignments than before.  · You have increased irritability or decreased ability to cope with stress.  · You have more symptoms than before.  Seek medical care if you have any of the following symptoms for more than 2 weeks after your injury:  · Lasting (chronic) headaches.  · Dizziness or balance problems.  · Nausea.  · Vision problems.  · Increased sensitivity to noise or light.  · Depression or mood swings.  · Anxiety or irritability.  · Memory problems.  · Difficulty concentrating or paying attention.  · Sleep problems.  · Feeling tired all the time.  SEEK IMMEDIATE MEDICAL CARE IF:  · You have severe or worsening headaches. These may be a sign of a blood clot in the brain.  · You have weakness (even if only in one hand, leg, or part of the face).  · You have numbness.  · You have decreased coordination.  · You vomit repeatedly.  · You have increased sleepiness.  · One pupil is larger than the other.  · You have convulsions.  · You have slurred speech.  · You have increased confusion. This may be a sign of a blood clot in the brain.  · You have increased restlessness, agitation, or irritability.  · You are unable to recognize people or places.  · You have neck pain.  · It is difficult to wake you up.  · You have unusual behavior changes.  · You lose consciousness.  MAKE SURE YOU:  · Understand these instructions.  · Will watch your condition.  · Will get help right away if you are not doing well or get worse.  Document Released: 03/30/2003 Document Revised: 01/12/2013 Document Reviewed: 07/30/2012  ExitCare® Patient Information ©2015 [employer]. This information is not intended to replace advice given to you by your health care provider. Make sure you discuss any questions you have with your health care provider.

## 2013-11-05 ENCOUNTER — Other Ambulatory Visit: Payer: Self-pay

## 2013-11-12 ENCOUNTER — Encounter: Payer: Self-pay | Admitting: Family Medicine

## 2013-11-12 ENCOUNTER — Ambulatory Visit (INDEPENDENT_AMBULATORY_CARE_PROVIDER_SITE_OTHER): Payer: 59 | Admitting: Family Medicine

## 2013-11-12 VITALS — BP 142/79 | HR 77 | Temp 97.6°F | Ht 71.0 in | Wt 163.1 lb

## 2013-11-12 DIAGNOSIS — L989 Disorder of the skin and subcutaneous tissue, unspecified: Secondary | ICD-10-CM
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
## 2013-11-12 NOTE — Assessment & Plan Note (Signed)
Unclear diagnosis. Punch biopsy done today. To call patient when results available.

## 2013-11-22 ENCOUNTER — Telehealth: Payer: Self-pay | Admitting: Family Medicine

## 2013-11-22 ENCOUNTER — Ambulatory Visit (INDEPENDENT_AMBULATORY_CARE_PROVIDER_SITE_OTHER)
Admission: RE | Admit: 2013-11-22 | Discharge: 2013-11-22 | Disposition: A | Discharge status: Home / Self Care | Payer: 59 | Source: Ambulatory Visit | Attending: Pulmonary Disease | Admitting: Pulmonary Disease

## 2013-11-22 DIAGNOSIS — J479 Bronchiectasis, uncomplicated: Secondary | ICD-10-CM

## 2013-11-22 NOTE — Telephone Encounter (Signed)
Called and reported results.  JB

## 2013-11-24 NOTE — Progress Notes (Signed)
Quick Note:  Pt returned call. Informed pt of the results per BQ. Pt verbalized understanding and denied any further questions or concerns at this time. ______

## 2013-11-24 NOTE — Progress Notes (Signed)
Quick Note:  lmtcb X1 ______ 

## 2013-12-06 ENCOUNTER — Other Ambulatory Visit: Payer: 59

## 2013-12-10 ENCOUNTER — Ambulatory Visit: Payer: 59 | Admitting: Pulmonary Disease

## 2014-01-03 ENCOUNTER — Other Ambulatory Visit: Payer: Self-pay | Admitting: Family Medicine

## 2014-01-03 DIAGNOSIS — Z8546 Personal history of malignant neoplasm of prostate: Secondary | ICD-10-CM

## 2014-01-04 ENCOUNTER — Other Ambulatory Visit: Payer: 59

## 2014-01-04 DIAGNOSIS — Z8546 Personal history of malignant neoplasm of prostate: Secondary | ICD-10-CM

## 2014-01-04 NOTE — Progress Notes (Signed)
PSA DONE TODAY Arthella Headings

## 2014-01-05 ENCOUNTER — Encounter: Payer: Self-pay | Admitting: Family Medicine

## 2014-01-05 ENCOUNTER — Telehealth: Payer: Self-pay | Admitting: Family Medicine

## 2014-01-05 LAB — PSA

## 2014-01-05 NOTE — Telephone Encounter (Signed)
Called patient to inform of undetectable PSA, done for surveillance s/p radical prostatectomy for prostate cancer.  Letter also sent with value. JB

## 2014-01-06 ENCOUNTER — Telehealth: Payer: Self-pay | Admitting: Family Medicine

## 2014-01-06 ENCOUNTER — Ambulatory Visit (INDEPENDENT_AMBULATORY_CARE_PROVIDER_SITE_OTHER): Payer: 59 | Admitting: Family Medicine

## 2014-01-06 ENCOUNTER — Encounter: Payer: Self-pay | Admitting: Family Medicine

## 2014-01-06 VITALS — BP 142/75 | HR 93 | Temp 99.0°F | Ht 71.0 in | Wt 158.5 lb

## 2014-01-06 DIAGNOSIS — J111 Influenza due to unidentified influenza virus with other respiratory manifestations: Secondary | ICD-10-CM

## 2014-01-06 DIAGNOSIS — J1189 Influenza due to unidentified influenza virus with other manifestations: Secondary | ICD-10-CM

## 2014-01-06 MED ORDER — AZITHROMYCIN 250 MG PO TABS: ORAL_TABLET | ORAL | Status: DC

## 2014-01-06 MED ORDER — OSELTAMIVIR PHOSPHATE 75 MG PO CAPS: 75.0000 mg | ORAL_CAPSULE | Freq: Two times a day (BID) | ORAL | Status: DC

## 2014-01-06 MED ORDER — BENZONATATE 100 MG PO CAPS: 100.0000 mg | ORAL_CAPSULE | Freq: Two times a day (BID) | ORAL | Status: DC | PRN

## 2014-01-06 NOTE — Patient Instructions (Signed)
Thank you for coming in, today!  I think you could have the flu. I want you to take Tamiflu 75 mg twice a day for 5 days You can also take Tessalon up to twice a day for cough. DO NOT chew the Tessalon, swallow it whole. You can keep taking Tylenol or other over-the-counter medicines for symptoms.  I will give you a prescription for azithromycin. Fill it and start taking it if your symptoms get worse or are not getting any better in the next 2-3 days. Take two pills the first day, then one pill a day for 4 more days.  Come back and see [place] as you need. If you start having very high fever, worse symptoms that medicines aren't helping, trouble breathing, etc, come back right away or go to the ED.  Please feel free to call with any questions or concerns at any time, at [phone]. --Dr. Venetia Maxon
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
## 2014-01-06 NOTE — Telephone Encounter (Signed)
Thinks he has the flu --would like some tamiflu

## 2014-02-07 ENCOUNTER — Telehealth: Payer: Self-pay | Admitting: Family Medicine

## 2014-02-07 NOTE — Telephone Encounter (Signed)
Patient scheduled for 1/19 at 830

## 2014-02-07 NOTE — Telephone Encounter (Signed)
Patient emailed me regarding a non-healing wound on knee.  I asked him to come in tomorrow Beatris Ship) January 19th at 08:30am for office visit.  Please put patient in schedule (double-book is fine).  JB

## 2014-02-08 ENCOUNTER — Ambulatory Visit (INDEPENDENT_AMBULATORY_CARE_PROVIDER_SITE_OTHER): Payer: 59 | Admitting: Family Medicine

## 2014-02-08 VITALS — BP 128/68

## 2014-02-08 DIAGNOSIS — S80212A Abrasion, left knee, initial encounter: Secondary | ICD-10-CM

## 2014-02-08 MED ORDER — MUPIROCIN CALCIUM 2 % EX CREA: 1.0000 "application " | TOPICAL_CREAM | Freq: Two times a day (BID) | CUTANEOUS | Status: DC

## 2014-02-08 MED ORDER — SULFAMETHOXAZOLE-TRIMETHOPRIM 800-160 MG PO TABS: 2.0000 | ORAL_TABLET | Freq: Two times a day (BID) | ORAL | Status: DC

## 2014-02-08 NOTE — Patient Instructions (Signed)
It was a pleasure to see you today.  Mupirocin (bactroban) cream to left knee twice daily, cover with bandage/gauze to prevent abrasions.   If not better in the coming 7-10 days, may start Bactrim DS tablets, take 2 tablets by mouth two times daily for 10 days.
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
## 2014-02-15 ENCOUNTER — Ambulatory Visit: Payer: Self-pay | Admitting: Family Medicine

## 2014-04-05 ENCOUNTER — Telehealth: Payer: Self-pay | Admitting: Internal Medicine

## 2014-04-05 NOTE — Telephone Encounter (Signed)
Pt states he is having lots of abdominal bloating and cramping. Thinks he is having an IBS flare and states it is the worse it has been. States really started after returning from [place]. Pt scheduled to see Dr. Olevia Perches tomorrow at 3:30pm. Pt aware of appt.

## 2014-04-06 ENCOUNTER — Encounter: Payer: Self-pay | Admitting: Internal Medicine

## 2014-04-06 ENCOUNTER — Ambulatory Visit (INDEPENDENT_AMBULATORY_CARE_PROVIDER_SITE_OTHER): Payer: 59 | Admitting: Internal Medicine

## 2014-04-06 VITALS — BP 116/60 | HR 81 | Ht 71.0 in | Wt 162.4 lb

## 2014-04-06 DIAGNOSIS — K5289 Other specified noninfective gastroenteritis and colitis: Secondary | ICD-10-CM

## 2014-04-06 DIAGNOSIS — K52839 Microscopic colitis, unspecified: Secondary | ICD-10-CM

## 2014-04-06 MED ORDER — BUDESONIDE 3 MG PO CP24: ORAL_CAPSULE | ORAL | Status: DC

## 2014-04-06 MED ORDER — DICYCLOMINE HCL 10 MG PO CAPS: 10.0000 mg | ORAL_CAPSULE | Freq: Two times a day (BID) | ORAL | Status: DC

## 2014-04-06 NOTE — Progress Notes (Signed)
Dustin Burton September 25, 1950 [id number]  Note: This dictation was prepared with Dragon digital system. Any transcriptional errors that result from this procedure are unintentional.   History of Present Illness: This is a 64 year old white male with history of  lymphocytic colitis diagnosed on colonoscopy in June 2012 and which  responded to .Entecort.[name] He is here today because of recurrent diarrhea. He was on a cruise in the [place] at  the end of January 2016 and after returning developed left lower quadrant abdominal abdominal pain which has persisted now for 6 weeks. Denies rectal bleeding or  fever. The stools are soft and sometimes urgent but no true diarrhea. He has a positive family history of colon cancer in his father. He also has a history of gastroesophageal reflux evaluated in June 2012. He has history of prostate cancer but no radiation. Pt has tried  probiotics for 6 weeks without much improvement    Past Medical History  Diagnosis Date  . Cancer     PROSTATE  . Hemorrhoids   . Brachial plexitis   . Elevated LFTs 07/2001  . IBS (irritable bowel syndrome)   . Hypercholesteremia   . Seasonal allergies     Past Surgical History  Procedure Laterality Date  . Prostatectomy    . Hand surgery      left index finger  . Knee surgery      right    Allergies  Allergen Reactions  . Tadalafil Other (See Comments)    Muscular leg pain    Family history and social history have been reviewed.  Review of Systems: Change in bowel habits. Left lower quadrant abdominal discomfort.  The remainder of the 10 point ROS is negative except as outlined in the H&P  Physical Exam: General Appearance Well developed, in no distress Eyes  Non icteric  HEENT  Non traumatic, normocephalic  Mouth No lesion, tongue papillated, no cheilosis Neck Supple without adenopathy, thyroid not enlarged, no carotid bruits, no JVD Lungs Clear to auscultation bilaterally COR Normal S1, normal S2, regular  rhythm, no murmur, quiet precordium Abdomen tender left lower and left middle quadrant normoactive bowel sounds. No rebound or mass Rectal small amount of soft Hemoccult positive stool Extremities  No pedal edema Skin No lesions Neurological Alert and oriented x 3 Psychological Normal mood and affect  Assessment and Plan:   64 year old white male with history of microscopic colitis now with the change in bowel habits after returning from a cruise to [place]. He has taken probiotics to normalize his bacteria flora. I think he has recurrence of microscopic colitis and we will treat him with Entocort 9 mg for 2 weeks, 6 mg for 2 weeks, then 3 mg for 2 weeks. And Bentyl 10 mg twice a day. I will see him again in 6 weeks. If diarrhea does not improve consider flexible sigmoidoscopy and biopsies      [name] 04/06/2014

## 2014-04-06 NOTE — Patient Instructions (Addendum)
We have sent the following medications to your pharmacy for you to pick up at your convenience:  Entocort:  3 x 2 weeks, 2 x2 weeks, 1 x 2 weeks.; Bentyl  Please follow up on 05/24/2014 at 3:15pm  Dr Lindell Noe

## 2014-04-29 ENCOUNTER — Ambulatory Visit (INDEPENDENT_AMBULATORY_CARE_PROVIDER_SITE_OTHER): Payer: 59 | Admitting: Family Medicine

## 2014-04-29 ENCOUNTER — Encounter: Payer: Self-pay | Admitting: Family Medicine

## 2014-04-29 VITALS — BP 134/73 | HR 69 | Temp 98.4°F | Ht 71.0 in | Wt 157.1 lb

## 2014-04-29 DIAGNOSIS — S40862A Insect bite (nonvenomous) of left upper arm, initial encounter: Secondary | ICD-10-CM | POA: Insufficient documentation

## 2014-04-29 DIAGNOSIS — W57XXXA Bitten or stung by nonvenomous insect and other nonvenomous arthropods, initial encounter: Secondary | ICD-10-CM | POA: Diagnosis not present

## 2014-04-29 DIAGNOSIS — K4091 Unilateral inguinal hernia, without obstruction or gangrene, recurrent: Secondary | ICD-10-CM | POA: Diagnosis not present

## 2014-04-29 DIAGNOSIS — K4090 Unilateral inguinal hernia, without obstruction or gangrene, not specified as recurrent: Secondary | ICD-10-CM | POA: Insufficient documentation

## 2014-04-29 NOTE — Patient Instructions (Signed)
It was a pleasure to see you today.   I believe you pain is due to an inguinal hernia.  [facility] referral. Avoid heavy lifting. NSAIDs for discomfort.

## 2014-04-29 NOTE — Progress Notes (Signed)
Subjective:     Patient ID: Dustin Burton, male   DOB: November 30, 1950, 64 y.o.   MRN: [id number]  Written by: Dustin Burton, MS3 Visit conducted with Dustin Burton, [facility] MS3, I agree with her note and have added my own comments in bold type.  Patient here for complaint of L sided groin pain, which is worse when getting off bicycle, also present as a dull pain that is less intense at other times. Has been ongoing since Feb.  Radiates down left groin into testicle and also is present at crease of L leg. No abdominal pain.  No dysuria. No penile discharge. Has had flare of lymphocytic colitis recently and is on Entocort for this,which is now resolving.   Also with concerns for skin issues; patch on right cheek, and another lesion which he found on his skin this morning along left triceps area.   Social Hx; Riding bicycle more recently; tolerating well, with decent endurance.  Feels the Entocort may be affecting him somewhat. JB HPI Dustin Burton is 64 year old male who presents today with a two month history of left groin pain and concern over two new skin lesions.   Groin Pain: He describes a generalized pain in his left groin area, with no focal point. It is a dull pain that occasionally radiates halfway down his left anterior thigh or up into his left testicle. He rates the pain 4/10 today, and is 7/10 at its worst. The pain is exacerbated when he bends over, after a bike ride, and during sexual intercourse. Ibuprofen and hot baths improve the pain. There was no pain relief with compression underwear use. There is no associated numbness in the area of the pain. He has no symptoms on this right side.   He recently switched to using a wide-based bike seat due to increased perineal pain. This change improved the perineal pain with no change in the left groin pain.   Past medical history is significant for a varicocele, but he states that this pain is different. He also describes a bladder spasm with probable  urinary frequency. There is no associated dysuria.   He denies fever, rash, discharge, headache, and muscle or bone pain. He is sleeping well. He has recently lost weight, but states it is intentional.   Skin: In November, he noticed a new hyperpigmented circular macule on his right chin. His wife has noticed it has become darker. There are other smaller, similar macules on his face.  In the office, a tick was found on the ventral aspect of the patient's left arm. The tick had not burrowed into the skin and was not engorged. It was removed with tweezers.   Microscopic Colitis: Patient recently had a flare of microscopic colitis which he attributes to stress surrounding his father's death. He experienced diarrhea, abdominal pain, and bloating. He is being followed by GI and is currently tapering off Entocort. He has experienced side effects of fatigue and feeling 'on edge' since starting the medication.  Social History: His father passed away in 01-30-2023 due to failure to thrive. He reports feeling notably stressed and extreme grief due to the circumstances surrounding the death.  Review of Systems See HPI    Objective:   Physical Exam  BP 134/73 mmHg  Pulse 69  Temp(Src) 98.4 F (36.9 C) (Oral)  Ht 5\' 11"  (1.803 m)  Wt 157 lb 1.6 oz (71.26 kg)  BMI 21.92 kg/m2 General: well-appearing, NAD CV: RRR, no MRG, nl S1 and S2 Pulm:  CTA bilaterally Abdomen: non-tender, non-distended abdomen GU: left varicole, non-tender testicles, left sided reducible indirect inguinal hernia, pain in inguinal canal  Skin: hyperpigmented 1cm macule on right cheek. Symmetrical, well-defined borders, consistent tan color. Deer tick found on ventral aspect of left arm.  Well appearing, no apparent distress HEENT Neck supple, no cervical adenopathy. R cheek/anterior auricular area with 1cm2 non-raised, uniformly hyperpigmented macule c/w solar lentigo.  No erosion or raised area, well demarcated.  COR Regular  S1S2 PULM Clear bilaterally.  ABD Soft, nontender, nondistended. No organomegaly.  Palpable dp pulses.  GU: Normal male genitalia. No testicular masses or tenderness. Some visible varicosities on L testis. Tenderness and hernia palpable with examination of L inguinal canal and patient valsalva while standing.  SKIN: Tick affixed to skin along L triceps area; removed with sterile forceps. Not engorged. JB EXTS no lower extremity edema noted. JB     Assessment:     Please see Problem List.    Plan:     Please see Problem List.

## 2014-04-29 NOTE — Assessment & Plan Note (Signed)
Left sided groin pain with reducible hernia on exam.  [facility] referral . Discussed red flags for which more immediate attention is needed. No heavy lifting or excessive valsalva. JB

## 2014-05-24 ENCOUNTER — Ambulatory Visit: Payer: 59 | Admitting: Internal Medicine

## 2014-05-26 ENCOUNTER — Encounter: Payer: Self-pay | Admitting: Family Medicine

## 2014-05-26 NOTE — Progress Notes (Signed)
Patient sent me an email with a question about the incidental finding on a CT scan done to work up a lung nodule. The patient's original email and my response are pasted below:  "Hi Dustin Burton,   You are raising an interesting question about the predictive value of coronary artery calcium.  There is a formal screening test for coronary artery screening ("CAC score")-- in your case, this is an incidental finding on a CT scan done for another indication (thus, no formal "CAC score").  In general, the CAC score is a very flawed test for screening low- or high-risk individuals. Debate remains over its use in intermediate-risk people (ie, people who, by other predictive models, have a 10-year risk of coronary events between 10-20%).  In these intermediate-risk people, those with high CAC scores may go on the have nuclear stress testing to look for asymptomatic coronary artery disease.  Getting back to your situation, I reviewed your risk factor profile and plugged in your values to the 10-year risk calculator.  Based on these values (which include age, sex, your most recent blood pressure, as well as the most recent lipid panel from 18 months ago), your 10-year risk is around 12%.  The decision to pursue workup for this incidental finding in a person without symptoms of coronary artery disease (and with a high degree of physical activity) is not an easy one, because:  1) the test is flawed (ie, a CAC score of ZERO does NOT exclude the presence of coronary artery disease); 2) the test involves radiation exposure-- both for the CAC score itself, as well as for the subsequent nuclear stress testing that may follow; 3) it's unclear whether a positive test and nuclear stress test will change our overall management, especially in an asymptomatic patient who is on a high-intensity statin and aspirin, and has good blood pressure control and does not have diabetes.  One approach that might be considered is to start by  reassessing your overall cardiac risk by reassessing the fasting lipid profile and rechecking blood pressure, and determining the 10-year risk based on these numbers.  I hope this sheds some light into this topic.  Best regards,   JB  -----Original Message----- From: Dustin Burton [[email or web address]]  Sent: Wednesday, May 25, 2014 2:28 PM To: Dustin [email or web address]> Subject: Lung ct.   Hi Dr. Lindell Noe  This past fall I had a lung CT due to scarring on my right lung.  I thought I was told to have a follow up X-ray at six months so I went to mychart to look at the CT results to see if it mentioned a follow up.  I did not see anything about a follow up but I did see a statement that I have extensive coronary atherosclerosis.  Is this something I need to see a cardiologist about or is the fact I am on a statin minimizes the risk and need to be seen by cardiologist.    Thanks and take care Dustin Burton from my iPad"

## 2014-07-05 ENCOUNTER — Other Ambulatory Visit (HOSPITAL_COMMUNITY): Payer: Self-pay | Admitting: Diagnostic Radiology

## 2014-07-05 ENCOUNTER — Other Ambulatory Visit: Payer: Self-pay | Admitting: Family Medicine

## 2014-07-05 DIAGNOSIS — I2584 Coronary atherosclerosis due to calcified coronary lesion: Secondary | ICD-10-CM

## 2014-07-08 ENCOUNTER — Other Ambulatory Visit: Payer: Self-pay | Admitting: Family Medicine

## 2014-07-11 ENCOUNTER — Other Ambulatory Visit: Payer: Self-pay | Admitting: Family Medicine

## 2014-07-11 ENCOUNTER — Encounter: Payer: Self-pay | Admitting: Internal Medicine

## 2014-07-11 NOTE — Progress Notes (Signed)
Cardiology Office Note   Date:  07/15/2014   ID:  [name] WILLERT, DOB 10/09/1950, MRN [id number]  PCP:  Willeen Niece, MD  Cardiologist:   Dorris Carnes, MD   Chief Complaint  Patient presents with  . New Evaluation    extensive coronary calcification      History of Present Illness: Dustin Burton is a 64 y.o. male who is referred for evaluation of abnormal chest CT  Pt has hx of microscopic colitis and bronchiecatsis.   Pt had a CT of chest in November 2015  This showed extensive coronary calcifications.   Pt isn followed in IM clinic     Pt bikes regularly    Rides 4 to 5 days per wk  Some high intensity  Works on controlled trainer.  HR up to 160s  Average in 130s   When doing training session does have tightness in chest area  Has had for a long time  Burps and symptoms relieved He notes not change in has pace or ability to ride       Current Outpatient Prescriptions  Medication Sig Dispense Refill  . albuterol (PROAIR HFA) 108 (90 BASE) MCG/ACT inhaler 2 puffs 3o minutes before strenuous exercise, every 4 to 6 hours as needed for shortness of breath. 2 Inhaler 5  . aspirin EC 81 MG tablet Take 1 tablet (81 mg total) by mouth daily.    [name] atorvastatin (LIPITOR) 40 MG tablet Take 1 tablet (40 mg total) by mouth daily. 30 tablet 11  . montelukast (SINGULAIR) 10 MG tablet Take 1 tablet (10 mg total) by mouth at bedtime. 30 tablet 11  . Multiple Vitamin (MULTIVITAMIN) tablet Take 1 tablet by mouth daily.      [name] omeprazole (PRILOSEC) 40 MG capsule Take 1 capsule (40 mg total) by mouth daily as needed. 30 capsule 6  . Probiotic Product (PROBIOTIC DAILY PO) Take 1 capsule by mouth daily.     No current facility-administered medications for this visit.    Allergies:   Tadalafil   Past Medical History  Diagnosis Date  . Prostate cancer   . Hemorrhoids   . Brachial plexitis   . Elevated LFTs 07/2001  . IBS (irritable bowel syndrome)   . Hypercholesteremia   . Seasonal  allergies     Past Surgical History  Procedure Laterality Date  . Prostatectomy    . Hand surgery Left     index finger  . Knee surgery Right     right     Social History:  The patient  reports that he has never smoked. He has never used smokeless tobacco. He reports that he drinks alcohol. He reports that he does not use illicit drugs.   Family History:  The patient's family history includes Alzheimer's disease in his father; Colon cancer in his father; Coronary artery disease in his mother; Crohn's disease in his father and mother; Cystic fibrosis in his other; Hyperlipidemia in his father and mother; Hypertension in his father and mother.    ROS:  Please see the history of present illness. All other systems are reviewed and  Negative to the above problem except as noted.    PHYSICAL EXAM: VS:  BP 130/74 mmHg  Pulse 74  Ht 5\' 11"  (1.803 m)  Wt 160 lb 12.8 oz (72.938 kg)  BMI 22.44 kg/m2  GEN: Well nourished, well developed, in no acute distress HEENT: normal Neck: no JVD, carotid bruits, or masses Cardiac: RRR; no murmurs, rubs,  or gallops,no edema  Respiratory:  clear to auscultation bilaterally, normal work of breathing GI: soft, nontender, nondistended, + BS  No hepatomegaly  MS: no deformity Moving all extremities   Skin: warm and dry, no rash Neuro:  Strength and sensation are intact Psych: euthymic mood, full affect   EKG:  EKG is ordered today.  SR 74  [place] PVCs    Lipid Panel    Component Value Date/Time   CHOL 191 08/04/2012 0912   TRIG 76 08/04/2012 0912   HDL 47 08/04/2012 0912   CHOLHDL 4.1 08/04/2012 0912   VLDL 15 08/04/2012 0912   LDLCALC 129* 08/04/2012 0912   LDLDIRECT 112* 08/02/2011 1228      Wt Readings from Last 3 Encounters:  07/15/14 160 lb 12.8 oz (72.938 kg)  04/29/14 157 lb 1.6 oz (71.26 kg)  04/06/14 162 lb 6.4 oz (73.664 kg)      ASSESSMENT AND PLAN:  1  CAD  Pt has evid of CAD on CT  HE is very active.  I do not  think he has any flow limiting symptoms  Rx risk factors.  2  HL  I would recomm more aggressive control of lipids  I would workwith diet but also add Zetia to regimen F/U with lipids in 2 months  F/U   IN clinic in 1 year   Signed, Dorris Carnes, MD  07/15/2014 8:18 AM    [address], [place],   [address] Phone: [phone]; Fax: [phone]

## 2014-07-12 ENCOUNTER — Encounter (HOSPITAL_COMMUNITY): Payer: 59

## 2014-07-12 ENCOUNTER — Ambulatory Visit (HOSPITAL_COMMUNITY): Admission: RE | Admit: 2014-07-12 | Payer: 59 | Source: Ambulatory Visit

## 2014-07-13 ENCOUNTER — Encounter: Payer: Self-pay | Admitting: *Deleted

## 2014-07-15 ENCOUNTER — Ambulatory Visit (INDEPENDENT_AMBULATORY_CARE_PROVIDER_SITE_OTHER): Payer: 59 | Admitting: Internal Medicine

## 2014-07-15 ENCOUNTER — Encounter: Payer: Self-pay | Admitting: Internal Medicine

## 2014-07-15 VITALS — BP 130/74 | HR 74 | Ht 71.0 in | Wt 160.8 lb

## 2014-07-15 DIAGNOSIS — R938 Abnormal findings on diagnostic imaging of other specified body structures: Secondary | ICD-10-CM

## 2014-07-15 DIAGNOSIS — R9389 Abnormal findings on diagnostic imaging of other specified body structures: Secondary | ICD-10-CM

## 2014-07-15 DIAGNOSIS — E7800 Pure hypercholesterolemia, unspecified: Secondary | ICD-10-CM

## 2014-07-15 MED ORDER — EZETIMIBE 10 MG PO TABS: 10.0000 mg | ORAL_TABLET | Freq: Every day | ORAL | Status: DC

## 2014-07-15 NOTE — Patient Instructions (Signed)
Your physician has recommended you make the following change in your medication:  1.) start zetia 10 mg once daily  Your physician recommends that you return for lab work in: 8-10 weeks. (LIPIDS, AST)  Your physician wants you to follow-up in: 1 year with Dr. Harrington Challenger.  You will receive a reminder letter in the mail two months in advance. If you don't receive a letter, please call our office to schedule the follow-up appointment.

## 2014-10-15 ENCOUNTER — Other Ambulatory Visit: Payer: Self-pay | Admitting: Family Medicine

## 2015-01-23 MED FILL — MONTELUKAST SOD 10 MG TAB: 10 | 30 days supply | Qty: 30 | Fill #0

## 2015-01-23 MED FILL — FLUTICASONE PROP 50 MCG SPR: 50 | 30 days supply | Qty: 16 | Fill #0

## 2015-01-26 LAB — LIPID PANEL
CHOLESTEROL: 117 mg/dL (ref 0–200)
HDL: 51 mg/dL (ref 35–70)
LDL Cholesterol: 53 mg/dL
Triglycerides: 67 mg/dL (ref 40–160)

## 2015-01-31 IMAGING — CT CT CHEST W/O CM
2 of 3 series · 15 of 36 positions shown, 18 images · IV contrast (Omnipaque 300)
Comparison: 04/19/2013

CLINICAL DATA: Followup scar in the right middle lobe.

EXAM:
CT CHEST WITHOUT CONTRAST
TECHNIQUE: Multidetector CT imaging of the chest was performed following the
standard protocol without IV contrast..

[Series 2: chest routine with · axial · 0.71mm/px · z∈[+937,+1242]mm · 12 of 73 slices shown, 15 images]
[im 6/73  mediastinal]
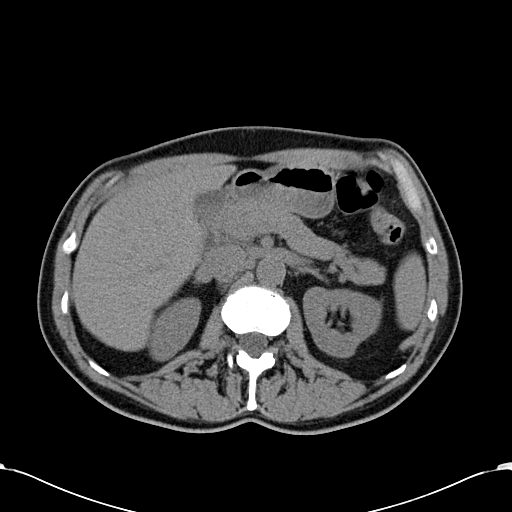
[im 6/73  lung]
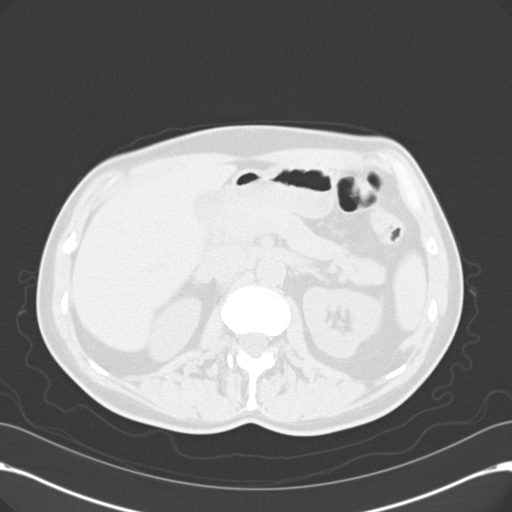
[im 11/73  lung]
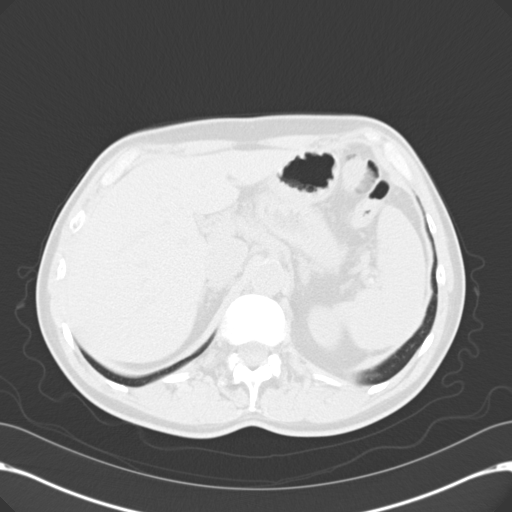
[im 17/73  lung]
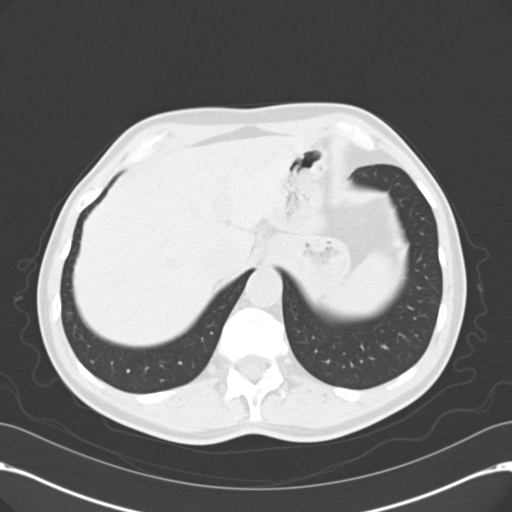
[im 22/73  lung]
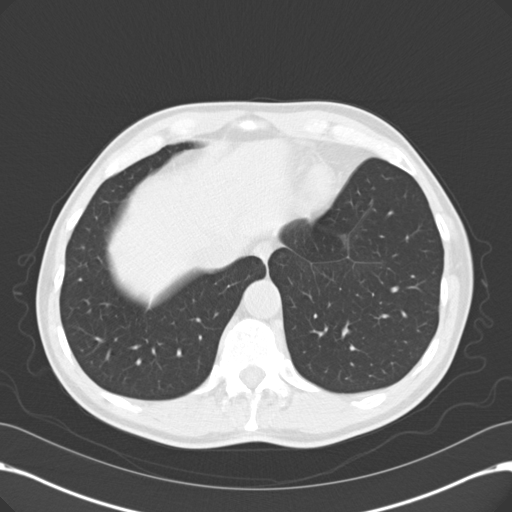
[im 27/73  mediastinal]
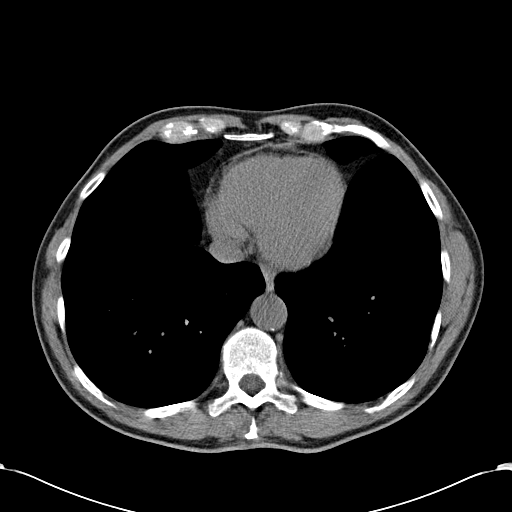
[im 27/73  lung]
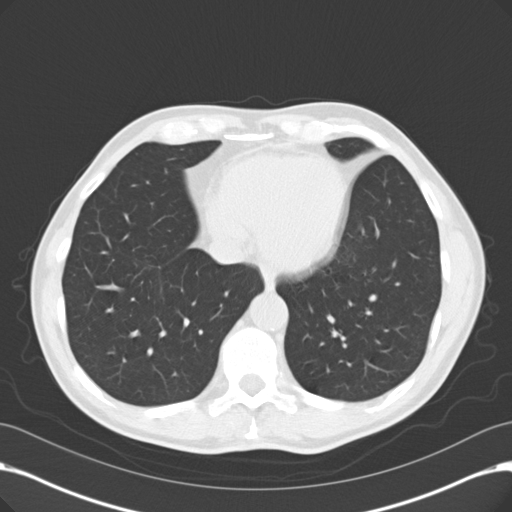
[im 33/73  lung]
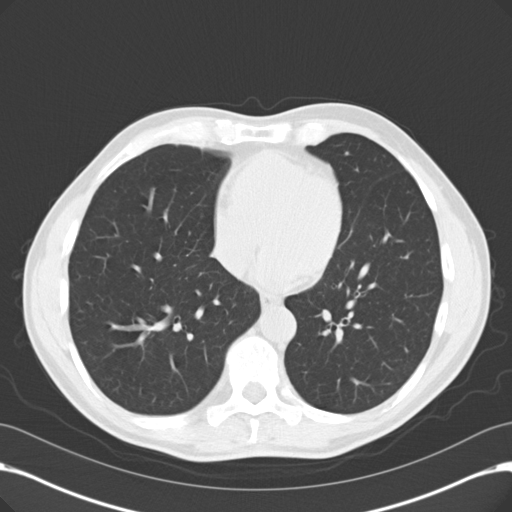
[im 41/73  lung]
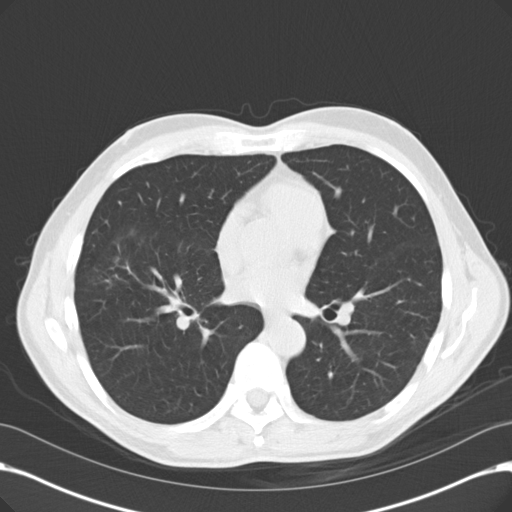
[im 46/73  lung]
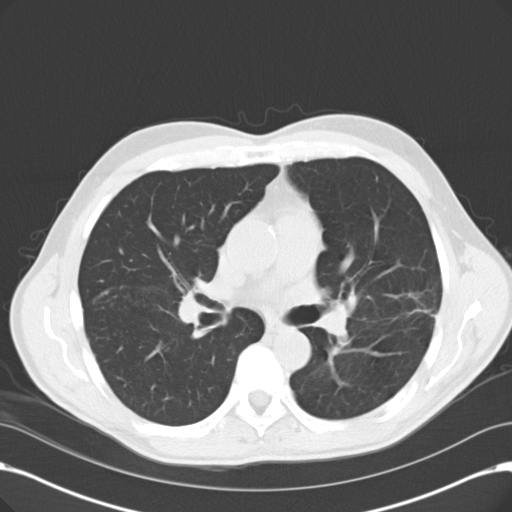
[im 51/73  mediastinal]
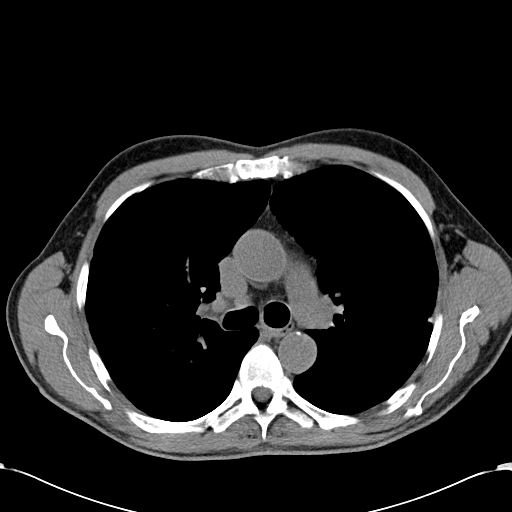
[im 51/73  lung]
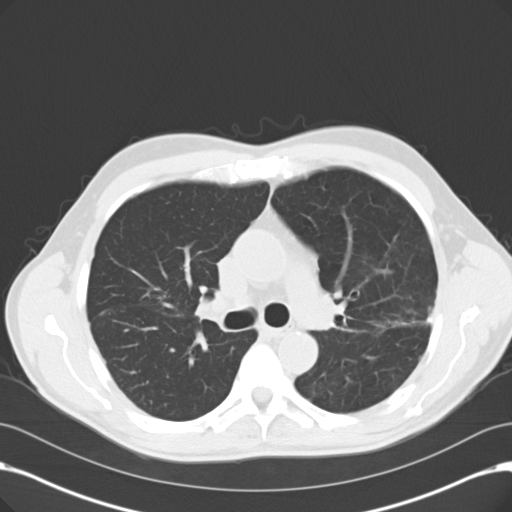
[im 57/73  lung]
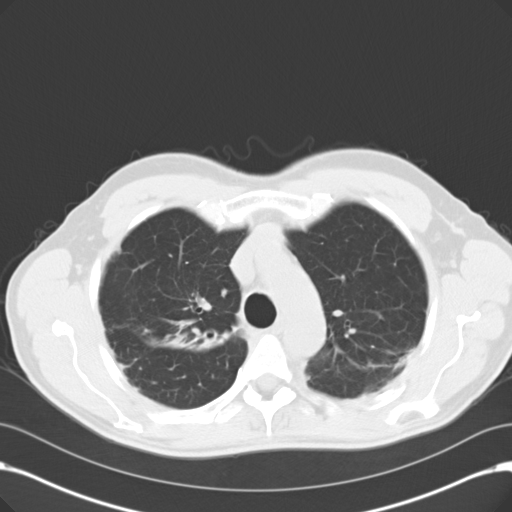
[im 62/73  lung]
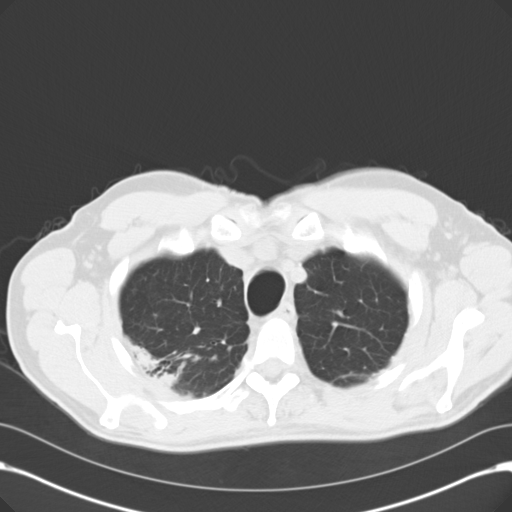
[im 67/73  lung]
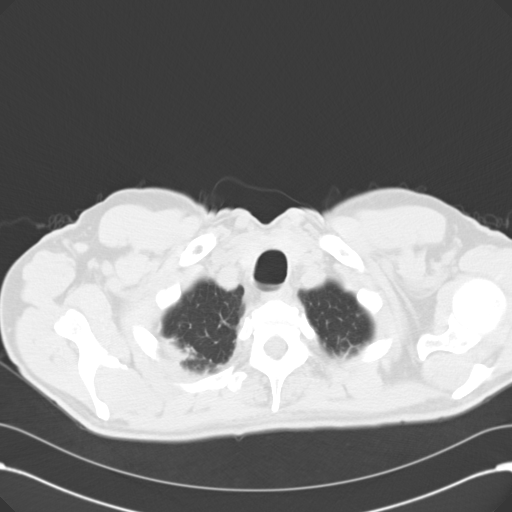

[Series 602: <mpr thick range> · coronal · 0.73mm/px · 3 of 125 slices shown]
[im 25/125  lung]
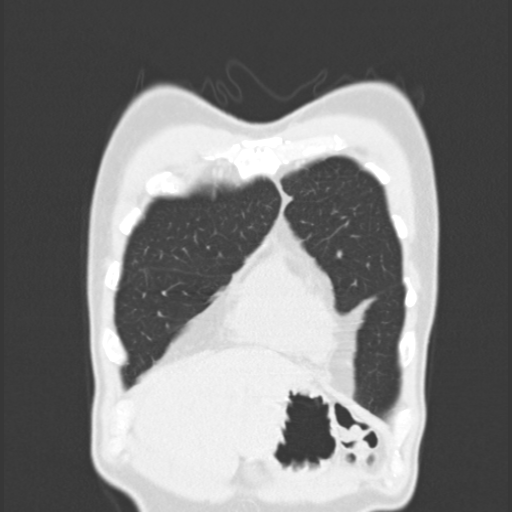
[im 50/125  lung]
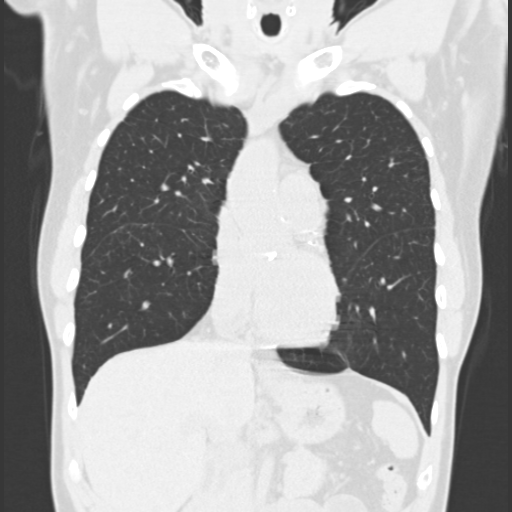
[im 75/125  lung]
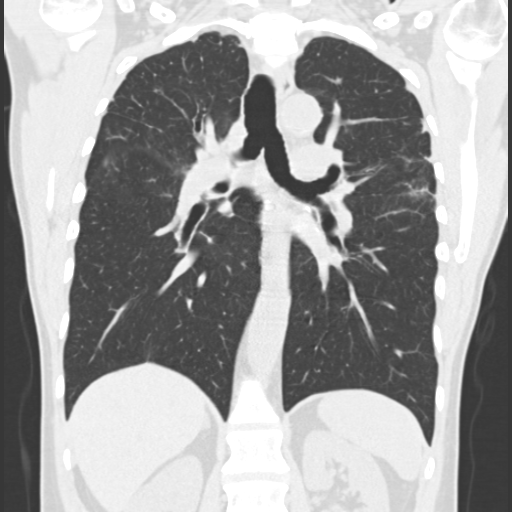

[15 of 36 positions shown; findings below may reference images not displayed]

FINDINGS: THORACIC INLET/BODY WALL:

No acute abnormality.

MEDIASTINUM:

Normal heart size. Stable mild anterior pericardial thickening.
Single coarse calcification at the aortic valve. Diffuse
atherosclerosis, including the coronary arteries. No evidence of
acute vascular abnormality. Granulomatous changes noted( lower
periesophageal lymph node). No noncalcified lymphadenopathy.

LUNG WINDOWS:

There is again noted scarring within the posterior segments of the
bilateral upper lobes, more extensive on the right were there is
also subpleural cylindrical bronchiectasis. Subpleural calcification
is present. There is also mild reticular and nodular densities in
the superior segments of the lower lobes bilaterally which is also
stable and suggestive of post infectious scarring. There is no
nodule discrete from these areas. There is no edema, consolidation,
effusion, or pneumothorax.

UPPER ABDOMEN:

No acute findings.

OSSEOUS:

No acute fracture.  No suspicious lytic or blastic lesions.
IMPRESSION: 1. Stable biapical scarring, likely postinfectious.
2. Extensive coronary atherosclerosis.

## 2015-02-02 ENCOUNTER — Telehealth: Payer: Self-pay | Admitting: Family Medicine

## 2015-02-02 NOTE — Telephone Encounter (Signed)
appt scheduled

## 2015-02-02 NOTE — Telephone Encounter (Signed)
yes

## 2015-02-02 NOTE — Telephone Encounter (Signed)
Pt is needing a new PCP and Dr. Asa Lente recommended you to him Please advise

## 2015-02-06 MED FILL — VENTOLIN HFA 90 MCG INHALER: 108 (90 BAS | 17 days supply | Qty: 18 | Fill #0

## 2015-02-07 MED FILL — levoFLOXacin 500 MG TABS: 500 | 14 days supply | Qty: 14 | Fill #0

## 2015-02-09 ENCOUNTER — Ambulatory Visit (INDEPENDENT_AMBULATORY_CARE_PROVIDER_SITE_OTHER)
Admission: RE | Admit: 2015-02-09 | Discharge: 2015-02-09 | Disposition: A | Discharge status: Home / Self Care | Payer: 59 | Source: Ambulatory Visit | Attending: Internal Medicine | Admitting: Internal Medicine

## 2015-02-09 ENCOUNTER — Other Ambulatory Visit (INDEPENDENT_AMBULATORY_CARE_PROVIDER_SITE_OTHER): Payer: 59

## 2015-02-09 ENCOUNTER — Encounter: Payer: Self-pay | Admitting: Internal Medicine

## 2015-02-09 ENCOUNTER — Ambulatory Visit (INDEPENDENT_AMBULATORY_CARE_PROVIDER_SITE_OTHER): Payer: 59 | Admitting: Internal Medicine

## 2015-02-09 VITALS — BP 138/80 | HR 68 | Temp 98.2°F | Resp 16 | Ht 71.0 in | Wt 161.0 lb

## 2015-02-09 DIAGNOSIS — Z23 Encounter for immunization: Secondary | ICD-10-CM

## 2015-02-09 DIAGNOSIS — E785 Hyperlipidemia, unspecified: Secondary | ICD-10-CM | POA: Diagnosis not present

## 2015-02-09 DIAGNOSIS — Z1159 Encounter for screening for other viral diseases: Secondary | ICD-10-CM

## 2015-02-09 DIAGNOSIS — R05 Cough: Secondary | ICD-10-CM | POA: Insufficient documentation

## 2015-02-09 DIAGNOSIS — C61 Malignant neoplasm of prostate: Secondary | ICD-10-CM | POA: Diagnosis not present

## 2015-02-09 DIAGNOSIS — Z86010 Personal history of colonic polyps: Secondary | ICD-10-CM

## 2015-02-09 DIAGNOSIS — R059 Cough, unspecified: Secondary | ICD-10-CM

## 2015-02-09 LAB — TSH: TSH: 1.47 u[IU]/mL (ref 0.35–4.50)

## 2015-02-09 LAB — COMPREHENSIVE METABOLIC PANEL
ALBUMIN: 4.3 g/dL (ref 3.5–5.2)
ALK PHOS: 83 U/L (ref 39–117)
ALT: 24 U/L (ref 0–53)
AST: 25 U/L (ref 0–37)
BUN: 21 mg/dL (ref 6–23)
CO2: 28 mEq/L (ref 19–32)
CREATININE: 1.03 mg/dL (ref 0.40–1.50)
Calcium: 9.6 mg/dL (ref 8.4–10.5)
Chloride: 103 mEq/L (ref 96–112)
GFR: 77.05 mL/min (ref >60.00)
GLUCOSE: 91 mg/dL (ref 70–99)
Potassium: 4.1 mEq/L (ref 3.5–5.1)
SODIUM: 139 meq/L (ref 135–145)
TOTAL PROTEIN: 7.4 g/dL (ref 6.0–8.3)
Total Bilirubin: 0.5 mg/dL (ref 0.2–1.2)

## 2015-02-09 LAB — CBC WITH DIFFERENTIAL/PLATELET
Basophils Absolute: 0 10*3/uL (ref 0.0–0.1)
Basophils Relative: 0.4 % (ref 0.0–3.0)
EOS ABS: 0.2 10*3/uL (ref 0.0–0.7)
Eosinophils Relative: 3 % (ref 0.0–5.0)
HCT: 43 % (ref 39.0–52.0)
HEMOGLOBIN: 14.4 g/dL (ref 13.0–17.0)
LYMPHS ABS: 1.5 10*3/uL (ref 0.7–4.0)
Lymphocytes Relative: 21.7 % (ref 12.0–46.0)
MCHC: 33.5 g/dL (ref 30.0–36.0)
MCV: 94.9 fl (ref 78.0–100.0)
Monocytes Absolute: 0.8 10*3/uL (ref 0.1–1.0)
Monocytes Relative: 11.9 % (ref 3.0–12.0)
NEUTROS PCT: 63 % (ref 43.0–77.0)
Neutro Abs: 4.3 10*3/uL (ref 1.4–7.7)
Platelets: 328 10*3/uL (ref 150.0–400.0)
RBC: 4.53 Mil/uL (ref 4.22–5.81)
RDW: 13.7 % (ref 11.5–15.5)
WBC: 6.9 10*3/uL (ref 4.0–10.5)

## 2015-02-09 LAB — PSA: PSA: 0 ng/mL — ABNORMAL LOW (ref 0.10–4.00)

## 2015-02-09 NOTE — Progress Notes (Signed)
Pre visit review using our clinic review tool, if applicable. No additional management support is needed unless otherwise documented below in the visit note. 

## 2015-02-09 NOTE — Patient Instructions (Signed)

## 2015-02-09 NOTE — Progress Notes (Signed)
Subjective:  Patient ID: DIMAGGIO WINEY, male    DOB: [birth year]  Age: 65 y.o. MRN: [id number]  CC: Cough  NEW TO ME  HPI CAELLUM STORK presents for a cough for several weeks. He saw his previous position and has been taking a course of Levaquin. When the symptoms started he had a sore throat and postnasal drip. He tells me those symptoms have resolved and his cough is now nonproductive and is quickly resolving. He wants to have a chest x-ray done because he has a prior history of bronchiectasis.  History Nahun has a past medical history of Prostate cancer ([place]); Hemorrhoids; Brachial plexitis; Elevated LFTs (07/2001); IBS (irritable bowel syndrome); Hypercholesteremia; and Seasonal allergies.   He has past surgical history that includes Prostatectomy; Hand surgery (Left); and Knee surgery (Right).   His family history includes Alzheimer's disease in his father; Colon cancer in his father; Coronary artery disease in his mother; Crohn's disease in his father and mother; Cystic fibrosis in his other; Hyperlipidemia in his father and mother; Hypertension in his father and mother.He reports that he has never smoked. He has never used smokeless tobacco. He reports that he drinks alcohol. He reports that he does not use illicit drugs.  Outpatient Prescriptions Prior to Visit  Medication Sig Dispense Refill  . albuterol (PROAIR HFA) 108 (90 BASE) MCG/ACT inhaler 2 puffs 3o minutes before strenuous exercise, every 4 to 6 hours as needed for shortness of breath. 2 Inhaler 5  . aspirin EC 81 MG tablet Take 1 tablet (81 mg total) by mouth daily.    [name] atorvastatin (LIPITOR) 40 MG tablet Take 1 tablet (40 mg total) by mouth daily. 30 tablet 11  . atorvastatin (LIPITOR) 40 MG tablet Take 1 tablet by mouth  daily 90 tablet 3  . ezetimibe (ZETIA) 10 MG tablet Take 1 tablet (10 mg total) by mouth daily. (Patient taking differently: Take 5 mg by mouth daily. ) 90 tablet 3  . montelukast (SINGULAIR) 10 MG tablet  Take 1 tablet (10 mg total) by mouth at bedtime. 30 tablet 11  . Multiple Vitamin (MULTIVITAMIN) tablet Take 1 tablet by mouth daily.      . Probiotic Product (PROBIOTIC DAILY PO) Take 1 capsule by mouth daily.    [name] omeprazole (PRILOSEC) 40 MG capsule Take 1 capsule (40 mg total) by mouth daily as needed. 30 capsule 6   No facility-administered medications prior to visit.    ROS Review of Systems  Constitutional: Negative.  Negative for fever, chills, diaphoresis, appetite change and fatigue.  HENT: Negative.   Eyes: Negative.   Respiratory: Positive for cough. Negative for apnea, choking, chest tightness, shortness of breath, wheezing and stridor.   Cardiovascular: Negative.  Negative for chest pain, palpitations and leg swelling.  Gastrointestinal: Negative.  Negative for nausea, vomiting, abdominal pain, diarrhea and constipation.  Endocrine: Negative.   Genitourinary: Negative.  Negative for difficulty urinating.  Musculoskeletal: Negative.  Negative for myalgias, back pain and joint swelling.  Skin: Negative.  Negative for color change and rash.  Allergic/Immunologic: Negative.   Neurological: Negative.  Negative for dizziness.  Hematological: Negative.  Negative for adenopathy. Does not bruise/bleed easily.  Psychiatric/Behavioral: Negative.     Objective:  BP 138/80 mmHg  Pulse 68  Temp(Src) 98.2 F (36.8 C) (Oral)  Resp 16  Ht 5\' 11"  (1.803 m)  Wt 161 lb (73.029 kg)  BMI 22.46 kg/m2  SpO2 97%  Physical Exam  Constitutional: He is oriented to person,  place, and time. No distress.  HENT:  Head: Normocephalic and atraumatic.  Mouth/Throat: Oropharynx is clear and moist. No oropharyngeal exudate.  Eyes: Conjunctivae are normal. Right eye exhibits no discharge. Left eye exhibits no discharge. No scleral icterus.  Neck: Normal range of motion. Neck supple. No JVD present. No tracheal deviation present. No thyromegaly present.  Cardiovascular: Normal rate, regular rhythm,  normal heart sounds and intact distal pulses.  Exam reveals no gallop and no friction rub.   No murmur heard. Pulmonary/Chest: Effort normal and breath sounds normal. No stridor. No respiratory distress. He has no wheezes. He has no rales. He exhibits no tenderness.  Abdominal: Soft. Bowel sounds are normal. He exhibits no distension and no mass. There is no tenderness. There is no rebound and no guarding.  Musculoskeletal: Normal range of motion. He exhibits no edema or tenderness.  Lymphadenopathy:    He has no cervical adenopathy.  Neurological: He is oriented to person, place, and time.  Skin: Skin is warm and dry. No rash noted. He is not diaphoretic. No erythema. No pallor.  Psychiatric: He has a normal mood and affect. His behavior is normal. Judgment and thought content normal.  Vitals reviewed.    Lab Results  Component Value Date   WBC 6.9 02/09/2015   HGB 14.4 02/09/2015   HCT 43.0 02/09/2015   PLT 328.0 02/09/2015   GLUCOSE 91 02/09/2015   CHOL 117 01/26/2015   TRIG 67 01/26/2015   HDL 51 01/26/2015   LDLDIRECT 112* 08/02/2011   LDLCALC 53 01/26/2015   ALT 24 02/09/2015   AST 25 02/09/2015   NA 139 02/09/2015   K 4.1 02/09/2015   CL 103 02/09/2015   CREATININE 1.03 02/09/2015   BUN 21 02/09/2015   CO2 28 02/09/2015   TSH 1.47 02/09/2015   PSA 0.00* 02/09/2015   Assessment & Plan:   Marie was seen today for cough.  Diagnoses and all orders for this visit:  ADENOCARCINOMA, PROSTATE- his PSA remains undetectable, there is no evidence of recurrence -     PSA; Future  Hyperlipidemia with target LDL less than 70- he is achieved his LDL goal -     Comprehensive metabolic panel; Future -     CBC with Differential/Platelet; Future -     TSH; Future  Need for hepatitis C screening test -     Hepatitis C antibody; Future  History of colonic polyps- he requests a referral for a follow-up colonoscopy.  Cough- his upper respiratory infection is quickly resolving,  his chest x-ray is unchanged from prior chest x-rays. He will let me know if he develops any new or worsening symptoms. -     Ambulatory referral to Gastroenterology -     DG Chest 2 View; Future  Need for vaccination with 13-polyvalent pneumococcal conjugate vaccine -     Pneumococcal conjugate vaccine 13-valent  I have discontinued Mr. Eastmond omeprazole. I am also having him maintain his multivitamin, Probiotic Product (PROBIOTIC DAILY PO), albuterol, aspirin EC, atorvastatin, montelukast, ezetimibe, atorvastatin, and levofloxacin.  Meds ordered this encounter  Medications  . levofloxacin (LEVAQUIN) 500 MG tablet    Sig: Take 500 mg by mouth daily.     Follow-up: Return if symptoms worsen or fail to improve.  Scarlette Calico, MD

## 2015-02-10 ENCOUNTER — Encounter: Payer: Self-pay | Admitting: Internal Medicine

## 2015-02-10 LAB — HEPATITIS C ANTIBODY: HCV AB: NEGATIVE

## 2015-03-14 MED FILL — MONTELUKAST SOD 10 MG TAB: 10 | 90 days supply | Qty: 90 | Fill #1

## 2015-03-14 MED FILL — ZETIA 10 MG TABLET: 10 | 90 days supply | Qty: 90 | Fill #5

## 2015-06-06 ENCOUNTER — Encounter: Payer: Self-pay | Admitting: Gastroenterology

## 2015-06-20 MED FILL — FLUTICASONE PROP 50 MCG SPR: 50 | 30 days supply | Qty: 16 | Fill #1

## 2015-06-20 MED FILL — MONTELUKAST SOD 10 MG TAB: 10 | 90 days supply | Qty: 90 | Fill #2

## 2015-07-21 ENCOUNTER — Encounter: Payer: Self-pay | Admitting: Gastroenterology

## 2015-07-21 ENCOUNTER — Ambulatory Visit (INDEPENDENT_AMBULATORY_CARE_PROVIDER_SITE_OTHER): Payer: Medicare Other | Admitting: Gastroenterology

## 2015-07-21 VITALS — BP 122/82 | HR 66 | Ht 71.0 in | Wt 159.7 lb

## 2015-07-21 DIAGNOSIS — K52832 Lymphocytic colitis: Secondary | ICD-10-CM | POA: Diagnosis not present

## 2015-07-21 DIAGNOSIS — Z800 Family history of malignant neoplasm of digestive organs: Secondary | ICD-10-CM

## 2015-07-21 DIAGNOSIS — Z1211 Encounter for screening for malignant neoplasm of colon: Secondary | ICD-10-CM

## 2015-07-21 MED ORDER — NA SULFATE-K SULFATE-MG SULF 17.5-3.13-1.6 GM/177ML PO SOLN: 1.0000 | Freq: Once | ORAL | Status: DC

## 2015-07-21 MED FILL — SUPREP BOWEL PREP KIT: 17.5-3.13-1 | 1 days supply | Qty: 354 | Fill #0

## 2015-07-21 NOTE — Patient Instructions (Signed)

## 2015-07-21 NOTE — Progress Notes (Signed)
HPI :  65 y/o male with history of microscopic colitis, here for follow up. Former patient of Dr. Olevia Perches. He has a history of lymphocytic colitis diagnosed in 2012.   He reports in general doing okay in regards to his bowel symptoms. He has some occasional flares of symptoms. He has been taking a probiotic which he thinks can help with gas / bloating. No blood in the stools. Stools on the looser side, roughly 2-3 BMs per day. Only rare urgency. He has a flare of symptoms maybe once per year or so. No significant abdominal pains or cramps. No weight loss. He takes aspirin 81mg  daily. He reports a history of statin use, and when he stopped it he htinks was better.   Mother has colitis. Father had colon cancer at age 74s.   He has a history of prostate cancer s/p surgery, doing well in this regard.   Colonoscopy 06/2010 - normal without polyps, biopsies show lymphocytic colitis EGD 06/2010 - irregular z-line, bx negative for BE Colonoscopy 03/2000 -    Past Medical History  Diagnosis Date  . Prostate cancer ([place])   . Hemorrhoids   . Brachial plexitis   . Elevated LFTs 07/2001  . IBS (irritable bowel syndrome)   . Hypercholesteremia   . Seasonal allergies      Past Surgical History  Procedure Laterality Date  . Prostatectomy    . Hand surgery Left     index finger  . Knee surgery Right     right   Family History  Problem Relation Age of Onset  . Hypertension Mother   . Hypertension Father     carotid endarterectomy  . Colon cancer Father   . Coronary artery disease Mother     PCI  . Crohn's disease Mother   . Crohn's disease Father   . Cystic fibrosis Other     niece  . Hyperlipidemia Mother   . Hyperlipidemia Father   . Alzheimer's disease Father    Social History  Substance Use Topics  . Smoking status: Never Smoker   . Smokeless tobacco: Never Used  . Alcohol Use: Yes     Comment: socially   Current Outpatient Prescriptions  Medication Sig Dispense Refill  .  albuterol (PROAIR HFA) 108 (90 BASE) MCG/ACT inhaler 2 puffs 3o minutes before strenuous exercise, every 4 to 6 hours as needed for shortness of breath. 2 Inhaler 5  . aspirin EC 81 MG tablet Take 1 tablet (81 mg total) by mouth daily.    [name] atorvastatin (LIPITOR) 40 MG tablet Take 1 tablet by mouth  daily 90 tablet 3  . ezetimibe (ZETIA) 10 MG tablet Take 1 tablet (10 mg total) by mouth daily. (Patient taking differently: Take 5 mg by mouth daily. ) 90 tablet 3  . montelukast (SINGULAIR) 10 MG tablet Take 1 tablet (10 mg total) by mouth at bedtime. 30 tablet 11  . Multiple Vitamin (MULTIVITAMIN) tablet Take 1 tablet by mouth daily.      . Probiotic Product (PROBIOTIC DAILY PO) Take 1 capsule by mouth daily.     No current facility-administered medications for this visit.   Allergies  Allergen Reactions  . Tadalafil Other (See Comments)    Muscular leg pain     Review of Systems: All systems reviewed and negative except where noted in HPI.   Lab Results  Component Value Date   WBC 6.9 02/09/2015   HGB 14.4 02/09/2015   HCT 43.0 02/09/2015   MCV  94.9 02/09/2015   PLT 328.0 02/09/2015    Lab Results  Component Value Date   CREATININE 1.03 02/09/2015   BUN 21 02/09/2015   NA 139 02/09/2015   K 4.1 02/09/2015   CL 103 02/09/2015   CO2 28 02/09/2015    Lab Results  Component Value Date   ALT 24 02/09/2015   AST 25 02/09/2015   ALKPHOS 83 02/09/2015   BILITOT 0.5 02/09/2015     Physical Exam: BP 122/82 mmHg  Pulse 66  Ht 5\' 11"  (1.803 m)  Wt 159 lb 11.2 oz (72.439 kg)  BMI 22.28 kg/m2 Constitutional: Pleasant,well-developed, male in no acute distress. HEENT: Normocephalic and atraumatic. Conjunctivae are normal. No scleral icterus. Neck supple.  Cardiovascular: Normal rate, regular rhythm.  Pulmonary/chest: Effort normal and breath sounds normal. No wheezing, rales or rhonchi. Abdominal: Soft, nondistended, nontender. Bowel sounds active throughout. There are no  masses palpable. No hepatomegaly. Extremities: no edema Lymphadenopathy: No cervical adenopathy noted. Neurological: Alert and oriented to person place and time. Skin: Skin is warm and dry. No rashes noted. Psychiatric: Normal mood and affect. Behavior is normal.   ASSESSMENT AND PLAN: 65 y/o male with history of lymphocytic colitis and strong FH of colon cancer, here for follow up:  Lymphocytic colitis - stable, only rare symptoms. Unclear if this is perhaps driven by statin use (intermediate risk medication for possible cause of microscopic colitis), as he has had improvement in symptoms when holding statin. However, he wishes to continue the statin given the cardiovascular benefit and that his symptoms are mild at this time. I discussed options moving forward for therapy. If he has a severe flare of symptoms he should call us to give him budesonide to which he has responded well in the past. He can otherwise try bismuth or immodium PRN for mild symptoms. He agreed.   Colon cancer screening - father had colon cancer at age 2s, per guidelines patient is due for colonoscopy every 5 years.  Due for screening exam now. I discussed the risks / benefits of colonoscopy with him and he wished to proceed. All questions answered.    [place], MD [facility]enter[facility] Pager [phone]

## 2015-07-28 ENCOUNTER — Encounter: Payer: Self-pay | Admitting: Gastroenterology

## 2015-08-10 ENCOUNTER — Ambulatory Visit (AMBULATORY_SURGERY_CENTER): Payer: Medicare Other | Admitting: Gastroenterology

## 2015-08-10 ENCOUNTER — Encounter: Payer: Self-pay | Admitting: Gastroenterology

## 2015-08-10 VITALS — BP 127/72 | HR 61 | Temp 99.1°F | Resp 14 | Ht 71.0 in | Wt 159.0 lb

## 2015-08-10 DIAGNOSIS — Z1211 Encounter for screening for malignant neoplasm of colon: Secondary | ICD-10-CM

## 2015-08-10 DIAGNOSIS — K52832 Lymphocytic colitis: Secondary | ICD-10-CM

## 2015-08-10 DIAGNOSIS — D125 Benign neoplasm of sigmoid colon: Secondary | ICD-10-CM

## 2015-08-10 DIAGNOSIS — D122 Benign neoplasm of ascending colon: Secondary | ICD-10-CM | POA: Diagnosis not present

## 2015-08-10 DIAGNOSIS — Z86010 Personal history of colonic polyps: Secondary | ICD-10-CM | POA: Diagnosis not present

## 2015-08-10 DIAGNOSIS — D123 Benign neoplasm of transverse colon: Secondary | ICD-10-CM

## 2015-08-10 LAB — HM COLONOSCOPY

## 2015-08-10 MED ORDER — SODIUM CHLORIDE 0.9 % IV SOLN: 500.0000 mL | INTRAVENOUS | Status: DC

## 2015-08-10 NOTE — Progress Notes (Signed)
Pts HR sounded irregular on admission.  Hooked pt up to monitor and watched heart rhythm to obtain a strip.  Pt is in NSR with PVC's.  Pt has no complaints. Reported to J. Venia Minks, Immunologist.  Will plan to proceed with procedure as planned.

## 2015-08-10 NOTE — Progress Notes (Signed)
Patient awakening,vss,report to rn 

## 2015-08-10 NOTE — Op Note (Signed)
[place] Patient Name: Dustin Burton Procedure Date: 08/10/2015 2:01 PM MRN: [id number] Endoscopist: Remo Lipps P. Havery Moros , MD Age: 65 Referring MD:  Date of Birth: 16-Dec-1950 Gender: Male Account #: [id number] Procedure:                Colonoscopy Indications:              Screening in patient at increased risk: Family                            history of 1st-degree relative with colorectal                            cancer before age 49 years. History of lymphocytic                            colitis. Medicines:                Monitored Anesthesia Care Procedure:                Pre-Anesthesia Assessment:                           - Prior to the procedure, a History and Physical                            was performed, and patient medications and                            allergies were reviewed. The patient's tolerance of                            previous anesthesia was also reviewed. The risks                            and benefits of the procedure and the sedation                            options and risks were discussed with the patient.                            All questions were answered, and informed consent                            was obtained. Prior Anticoagulants: The patient has                            taken aspirin, last dose was 1 day prior to                            procedure. ASA Grade Assessment: II - A patient                            with mild systemic disease. After reviewing the  risks and benefits, the patient was deemed in                            satisfactory condition to undergo the procedure.                           After obtaining informed consent, the colonoscope                            was passed under direct vision. Throughout the                            procedure, the patient's blood pressure, pulse, and                            oxygen saturations were monitored continuously. The                        Model CF-HQ190L [id number]) scope was introduced                            through the anus and advanced to the the cecum,                            identified by appendiceal orifice and ileocecal                            valve. The colonoscopy was performed without                            difficulty. The patient tolerated the procedure                            well. The quality of the bowel preparation was                            adequate. The ileocecal valve, appendiceal orifice,                            and rectum were photographed. Scope In: 2:04:39 PM Scope Out: 2:26:07 PM Scope Withdrawal Time: 0 hours 13 minutes 59 seconds  Total Procedure Duration: 0 hours 21 minutes 28 seconds  Findings:                 The perianal and digital rectal examinations were                            normal.                           A 6 mm polyp was found in the ascending colon. The                            polyp was sessile. The polyp was removed with a  cold snare. Resection and retrieval were complete.                           A 3 mm polyp was found in the transverse colon. The                            polyp was sessile. The polyp was removed with a                            cold biopsy forceps. Resection and retrieval were                            complete.                           A 5 mm polyp was found in the sigmoid colon. The                            polyp was sessile. The polyp was removed with a                            cold snare. Resection and retrieval were complete.                           Non-bleeding internal hemorrhoids were found during                            retroflexion.                           The exam was otherwise without abnormality.                           Biopsies for histology were taken with a cold                            forceps from the right colon and left colon for                             evaluation of microscopic colitis. Complications:            No immediate complications. Estimated blood loss:                            Minimal. Estimated Blood Loss:     Estimated blood loss was minimal. Impression:               - One 6 mm polyp in the ascending colon, removed                            with a cold snare. Resected and retrieved.                           - One 3 mm polyp in the transverse colon, removed  with a cold biopsy forceps. Resected and retrieved.                           - One 5 mm polyp in the sigmoid colon, removed with                            a cold snare. Resected and retrieved.                           - Non-bleeding internal hemorrhoids.                           - The examination was otherwise normal.                           - Biopsies were taken with a cold forceps from the                            right colon and left colon for evaluation of                            microscopic colitis. Recommendation:           - Patient has a contact number available for                            emergencies. The signs and symptoms of potential                            delayed complications were discussed with the                            patient. Return to normal activities tomorrow.                            Written discharge instructions were provided to the                            patient.                           - Resume previous diet.                           - Continue present medications.                           - Await pathology results.                           - No aspirin, ibuprofen, naproxen, or other                            non-steroidal anti-inflammatory drugs for 2 weeks  after polyp removal.                           - Repeat colonoscopy is recommended for                            surveillance. The colonoscopy date will be                             determined after pathology results from today's                            exam become available for review. Remo Lipps P. Armbruster, MD 08/10/2015 2:32:29 PM This report has been signed electronically.

## 2015-08-10 NOTE — Progress Notes (Signed)
Called to room to assist during endoscopic procedure.  Patient ID and intended procedure confirmed with present staff. Received instructions for my participation in the procedure from the performing physician.  

## 2015-08-10 NOTE — Patient Instructions (Signed)
YOU HAD AN ENDOSCOPIC PROCEDURE TODAY AT [facility]:   Refer to the procedure report that was given to you for any specific questions about what was found during the examination.  If the procedure report does not answer your questions, please call your gastroenterologist to clarify.  If you requested that your care partner not be given the details of your procedure findings, then the procedure report has been included in a sealed envelope for you to review at your convenience later.  YOU SHOULD EXPECT: Some feelings of bloating in the abdomen. Passage of more gas than usual.  Walking can help get rid of the air that was put into your GI tract during the procedure and reduce the bloating. If you had a lower endoscopy (such as a colonoscopy or flexible sigmoidoscopy) you may notice spotting of blood in your stool or on the toilet paper. If you underwent a bowel prep for your procedure, you may not have a normal bowel movement for a few days.  Please Note:  You might notice some irritation and congestion in your nose or some drainage.  This is from the oxygen used during your procedure.  There is no need for concern and it should clear up in a day or so.  SYMPTOMS TO REPORT IMMEDIATELY:   Following lower endoscopy (colonoscopy or flexible sigmoidoscopy):  Excessive amounts of blood in the stool  Significant tenderness or worsening of abdominal pains  Swelling of the abdomen that is new, acute  Fever of 100F or higher   For urgent or emergent issues, a gastroenterologist can be reached at any hour by calling [phone].   DIET: Your first meal following the procedure should be a small meal and then it is ok to progress to your normal diet. Heavy or fried foods are harder to digest and may make you feel nauseous or bloated.  Likewise, meals heavy in dairy and vegetables can increase bloating.  Drink plenty of fluids but you should avoid alcoholic beverages for 24  hours.  ACTIVITY:  You should plan to take it easy for the rest of today and you should NOT DRIVE or use heavy machinery until tomorrow (because of the sedation medicines used during the test).    FOLLOW UP: Our staff will call the number listed on your records the next business day following your procedure to check on you and address any questions or concerns that you may have regarding the information given to you following your procedure. If we do not reach you, we will leave a message.  However, if you are feeling well and you are not experiencing any problems, there is no need to return our call.  We will assume that you have returned to your regular daily activities without incident.  If any biopsies were taken you will be contacted by phone or by letter within the next 1-3 weeks.  Please call us at [phone] if you have not heard about the biopsies in 3 weeks.    SIGNATURES/CONFIDENTIALITY: You and/or your care partner have signed paperwork which will be entered into your electronic medical record.  These signatures attest to the fact that that the information above on your After Visit Summary has been reviewed and is understood.  Full responsibility of the confidentiality of this discharge information lies with you and/or your care-partner.  Polyps, hemorrhoids-handouts given  Repeat colonoscopy will be determined by pathology.

## 2015-08-11 ENCOUNTER — Telehealth: Payer: Self-pay

## 2015-08-11 NOTE — Telephone Encounter (Signed)
  Follow up Call-  Call back number 08/10/2015  Post procedure Call Back phone  # [phone]  Permission to leave phone message Yes     Patient questions:  Do you have a fever, pain , or abdominal swelling? No. Pain Score  0 *  Have you tolerated food without any problems? Yes.    Have you been able to return to your normal activities? Yes.    Do you have any questions about your discharge instructions: Diet   No. Medications  No. Follow up visit  No.  Do you have questions or concerns about your Care? No.  Actions: * If pain score is 4 or above: No action needed, pain <4.

## 2015-08-16 ENCOUNTER — Encounter: Payer: Self-pay | Admitting: Gastroenterology

## 2015-08-16 LAB — HM COLONOSCOPY

## 2015-08-17 ENCOUNTER — Encounter: Payer: Self-pay | Admitting: Internal Medicine

## 2015-08-17 ENCOUNTER — Ambulatory Visit (INDEPENDENT_AMBULATORY_CARE_PROVIDER_SITE_OTHER): Payer: Medicare Other | Admitting: Internal Medicine

## 2015-08-17 VITALS — BP 140/82 | HR 64 | Ht 71.0 in | Wt 164.2 lb

## 2015-08-17 DIAGNOSIS — E785 Hyperlipidemia, unspecified: Secondary | ICD-10-CM | POA: Diagnosis not present

## 2015-08-17 NOTE — Patient Instructions (Signed)
Your physician recommends that you continue on your current medications as directed. Please refer to the Current Medication list given to you today. Your physician wants you to follow-up in: 1 year with Dr. Ross.  You will receive a reminder letter in the mail two months in advance. If you don't receive a letter, please call our office to schedule the follow-up appointment.  

## 2015-08-17 NOTE — Progress Notes (Signed)
Cardiology Office Note   Date:  08/17/2015   ID:  ARLEY GAYDOS, DOB May 21, 1950, MRN [id number]  PCP:  Scarlette Calico, MD  Cardiologist:   Dorris Carnes, MD   F/U of CAD  History of Present Illness: Dustin Burton is a 65 y.o. male with a history of CAD (as seen by coronary calcification on CT  )  Active  I saw him in June 2016 Since seen he has been very active  Just got back from biking in [place] He says his breathing is good  No CP  No dizziness      Outpatient Medications Prior to Visit  Medication Sig Dispense Refill  . albuterol (PROAIR HFA) 108 (90 BASE) MCG/ACT inhaler 2 puffs 3o minutes before strenuous exercise, every 4 to 6 hours as needed for shortness of breath. 2 Inhaler 5  . aspirin EC 81 MG tablet Take 1 tablet (81 mg total) by mouth daily.    [name] atorvastatin (LIPITOR) 40 MG tablet Take 1 tablet by mouth  daily 90 tablet 3  . ezetimibe (ZETIA) 10 MG tablet Take 1 tablet (10 mg total) by mouth daily. (Patient taking differently: Take 5 mg by mouth daily. ) 90 tablet 3  . fluticasone (FLONASE) 50 MCG/ACT nasal spray   1  . montelukast (SINGULAIR) 10 MG tablet Take 1 tablet (10 mg total) by mouth at bedtime. 30 tablet 11  . Multiple Vitamin (MULTIVITAMIN) tablet Take 1 tablet by mouth daily.      . Probiotic Product (PROBIOTIC DAILY PO) Take 1 capsule by mouth daily.     No facility-administered medications prior to visit.      Allergies:   Tadalafil   Past Medical History:  Diagnosis Date  . Brachial plexitis   . Elevated LFTs 07/2001  . Hemorrhoids   . Hypercholesteremia   . IBS (irritable bowel syndrome)   . Prostate cancer ([place])   . Seasonal allergies     Past Surgical History:  Procedure Laterality Date  . HAND SURGERY Left    index finger  . KNEE SURGERY Right    right  . PROSTATECTOMY       Social History:  The patient  reports that he has never smoked. He has never used smokeless tobacco. He reports that he drinks alcohol. He reports that he does  not use drugs.   Family History:  The patient's family history includes Alzheimer's disease in his father; Colon cancer in his father; Coronary artery disease in his mother; Crohn's disease in his father and mother; Cystic fibrosis in his other; Hyperlipidemia in his father and mother; Hypertension in his father and mother.    ROS:  Please see the history of present illness. All other systems are reviewed and  Negative to the above problem except as noted.    PHYSICAL EXAM: VS:  BP 140/82   Pulse 64   Ht 5\' 11"  (1.803 m)   Wt 164 lb 3.2 oz (74.5 kg)   BMI 22.90 kg/m   GEN: Well nourished, well developed, in no acute distress  HEENT: normal  Neck: no JVD, carotid bruits, or masses Cardiac: RRR; no murmurs, rubs, or gallops,no edema  Respiratory:  clear to auscultation bilaterally, normal work of breathing GI: soft, nontender, nondistended, + BS  No hepatomegaly  MS: no deformity Moving all extremities   Skin: warm and dry, no rash Neuro:  Strength and sensation are intact Psych: euthymic mood, full affect   EKG:  EKG is ordered  today.  SR 64  With occasional PVC     Lipid Panel    Component Value Date/Time   CHOL 117 01/26/2015   TRIG 67 01/26/2015   HDL 51 01/26/2015   CHOLHDL 4.1 08/04/2012 0912   VLDL 15 08/04/2012 0912   LDLCALC 53 01/26/2015   LDLDIRECT 112 (H) 08/02/2011 1228      Wt Readings from Last 3 Encounters:  08/17/15 164 lb 3.2 oz (74.5 kg)  08/10/15 159 lb (72.1 kg)  07/21/15 159 lb 11.2 oz (72.4 kg)      ASSESSMENT AND PLAN:  1  CAD  No symptoms of angina  Stay active  Continue risk factor modificatoin  2.  HL  Excellent control  Keep on same regimen    F/U in 1 year  Sooner if develops CP, SOB       Disposition:   FU with  in   Signed, Dorris Carnes, MD  08/17/2015 1:51 PM    [address], [place], [place]  [address] Phone: [phone]; Fax: [phone]

## 2015-09-11 ENCOUNTER — Other Ambulatory Visit: Payer: Self-pay | Admitting: Internal Medicine

## 2015-09-12 MED FILL — ZETIA 10 MG TABLET: 10 | 90 days supply | Qty: 90 | Fill #0

## 2015-09-28 ENCOUNTER — Encounter: Payer: Self-pay | Admitting: Family Medicine

## 2015-09-28 ENCOUNTER — Ambulatory Visit (INDEPENDENT_AMBULATORY_CARE_PROVIDER_SITE_OTHER): Payer: Medicare Other | Admitting: Family Medicine

## 2015-09-28 DIAGNOSIS — M25572 Pain in left ankle and joints of left foot: Secondary | ICD-10-CM | POA: Diagnosis not present

## 2015-09-28 MED ORDER — NITROGLYCERIN 0.2 MG/HR TD PT24: MEDICATED_PATCH | TRANSDERMAL | 1 refills | Status: DC

## 2015-09-28 MED FILL — NITROGLYCERIN 0.2 MG/HR PTC: 0.2 | 44 days supply | Qty: 11 | Fill #0

## 2015-09-28 NOTE — Patient Instructions (Signed)
You strained your peroneal tendons and have evidence of tenosynovitis here. Ice the area 15 minutes at a time 3-4 times a day. Aleve 2 tabs twice a day with food OR ibuprofen 600mg  three times a day with food for pain and inflammation 7-10 days then as needed. Do home exercises with theraband and calf raises 3 sets of 10 once a day. Nitro patches - 1/4th patch to affected area, change daily. Arch supports are important - consider something like dr. Zoe Lan active series inserts. Avoid flat shoes and barefoot walking as much as possible. Follow up with me in 6 weeks. Activities as tolerated - you're not damaging anything - just follow the above instructions as well. If not improving we can consider increasing nitro dosage, orthotics, injection but it's unlikely we will have to do these.

## 2015-10-02 DIAGNOSIS — M25572 Pain in left ankle and joints of left foot: Secondary | ICD-10-CM | POA: Insufficient documentation
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
## 2015-10-02 NOTE — Assessment & Plan Note (Signed)
2/2 peroneal tendon strain with resultant tenosynovitis.  Icing, nsaids.  Start nitro patches and arch supports.  Shown home exercises to do as well.  F/u in 6 weeks.

## 2015-10-11 MED FILL — MONTELUKAST SOD 10 MG TAB: 10 | 90 days supply | Qty: 90 | Fill #3

## 2015-11-07 ENCOUNTER — Other Ambulatory Visit: Payer: Self-pay | Admitting: Family Medicine

## 2015-11-11 ENCOUNTER — Ambulatory Visit (INDEPENDENT_AMBULATORY_CARE_PROVIDER_SITE_OTHER): Payer: Medicare Other

## 2015-11-11 DIAGNOSIS — Z23 Encounter for immunization: Secondary | ICD-10-CM

## 2015-11-13 MED FILL — PROAIR HFA 90 MCG INHALER: 108 (90 BAS | 17 days supply | Qty: 9 | Fill #0

## 2015-12-15 ENCOUNTER — Encounter: Payer: Self-pay | Admitting: Internal Medicine

## 2015-12-18 ENCOUNTER — Other Ambulatory Visit: Payer: Self-pay | Admitting: Internal Medicine

## 2015-12-18 DIAGNOSIS — E785 Hyperlipidemia, unspecified: Secondary | ICD-10-CM

## 2015-12-18 MED ORDER — ATORVASTATIN CALCIUM 40 MG PO TABS: 40.0000 mg | ORAL_TABLET | Freq: Every day | ORAL | 3 refills | Status: DC

## 2015-12-18 MED FILL — ATORVASTATIN 40 MG TABLET: 40 | 90 days supply | Qty: 90 | Fill #0

## 2016-01-18 ENCOUNTER — Ambulatory Visit: Payer: Medicare Other

## 2016-01-19 ENCOUNTER — Telehealth: Payer: Medicare Other | Admitting: Family

## 2016-01-19 DIAGNOSIS — J069 Acute upper respiratory infection, unspecified: Secondary | ICD-10-CM

## 2016-01-19 DIAGNOSIS — R05 Cough: Secondary | ICD-10-CM

## 2016-01-19 DIAGNOSIS — B9689 Other specified bacterial agents as the cause of diseases classified elsewhere: Secondary | ICD-10-CM

## 2016-01-19 DIAGNOSIS — R059 Cough, unspecified: Secondary | ICD-10-CM

## 2016-01-19 MED ORDER — DOXYCYCLINE HYCLATE 100 MG PO TABS: 100.0000 mg | ORAL_TABLET | Freq: Two times a day (BID) | ORAL | 0 refills | Status: DC

## 2016-01-19 MED ORDER — BENZONATATE 100 MG PO CAPS: 100.0000 mg | ORAL_CAPSULE | Freq: Two times a day (BID) | ORAL | 0 refills | Status: DC | PRN

## 2016-01-19 MED FILL — DOXYCYCLINE HYCLATE 100 MG: 100 | 10 days supply | Qty: 20 | Fill #0

## 2016-01-19 MED FILL — BENZONATATE 100 MG CAPSULE: 100 | 10 days supply | Qty: 20 | Fill #0

## 2016-01-19 NOTE — Progress Notes (Signed)
We are sorry that you are not feeling well.  Here is how we plan to help!  Based on what you have shared with me it looks like you have upper respiratory tract inflammation that has resulted in a significant cough.  Inflammation and infection in the upper respiratory tract is commonly called bronchitis and has four common causes:  Allergies, Viral Infections, Acid Reflux and Bacterial Infections.  Allergies, viruses and acid reflux are treated by controlling symptoms or eliminating the cause. An example might be a cough caused by taking certain blood pressure medications. You stop the cough by changing the medication. Another example might be a cough caused by acid reflux. Controlling the reflux helps control the cough.  Based on your presentation I believe you most likely have A cough due to bacteria.  When patients have a fever and a productive cough with a change in color or increased sputum production, we are concerned about bacterial bronchitis.  If left untreated it can progress to pneumonia.  If your symptoms do not improve with your treatment plan it is important that you contact your provider.   I hve prescribed Doxycycline 100 mg twice a day for 7 days     In addition you may use A prescription cough medication called Tessalon Perles 100mg . You may take 1-2 capsules every 8 hours as needed for your cough.  IF NO BETTER, SEE YOUR PULMONOLOGIST.   USE OF BRONCHODILATOR ("RESCUE") INHALERS: There is a risk from using your bronchodilator too frequently.  The risk is that over-reliance on a medication which only relaxes the muscles surrounding the breathing tubes can reduce the effectiveness of medications prescribed to reduce swelling and congestion of the tubes themselves.  Although you feel brief relief from the bronchodilator inhaler, your asthma may actually be worsening with the tubes becoming more swollen and filled with mucus.  This can delay other crucial treatments, such as oral steroid  medications. If you need to use a bronchodilator inhaler daily, several times per day, you should discuss this with your provider.  There are probably better treatments that could be used to keep your asthma under control.     HOME CARE . Only take medications as instructed by your medical team. . Complete the entire course of an antibiotic. . Drink plenty of fluids and get plenty of rest. . Avoid close contacts especially the very young and the elderly . Cover your mouth if you cough or cough into your sleeve. . Always remember to wash your hands . A steam or ultrasonic humidifier can help congestion.   GET HELP RIGHT AWAY IF: . You develop worsening fever. . You become short of breath . You cough up blood. . Your symptoms persist after you have completed your treatment plan MAKE SURE YOU   Understand these instructions.  Will watch your condition.  Will get help right away if you are not doing well or get worse.  Your e-visit answers were reviewed by a board certified advanced clinical practitioner to complete your personal care plan.  Depending on the condition, your plan could have included both over the counter or prescription medications. If there is a problem please reply  once you have received a response from your provider. Your safety is important to [place].  If you have drug allergies check your prescription carefully.    You can use MyChart to ask questions about today's visit, request a non-urgent call back, or ask for a work or school excuse for 24  hours related to this e-Visit. If it has been greater than 24 hours you will need to follow up with your provider, or enter a new e-Visit to address those concerns. You will get an e-mail in the next two days asking about your experience.  I hope that your e-visit has been valuable and will speed your recovery. Thank you for using e-visits.

## 2016-02-12 ENCOUNTER — Encounter: Payer: Self-pay | Admitting: Internal Medicine

## 2016-02-12 ENCOUNTER — Other Ambulatory Visit (INDEPENDENT_AMBULATORY_CARE_PROVIDER_SITE_OTHER): Payer: Medicare Other

## 2016-02-12 ENCOUNTER — Ambulatory Visit (INDEPENDENT_AMBULATORY_CARE_PROVIDER_SITE_OTHER): Payer: Medicare Other | Admitting: Internal Medicine

## 2016-02-12 VITALS — BP 130/80 | HR 65 | Temp 97.9°F | Resp 16 | Ht 71.0 in | Wt 169.8 lb

## 2016-02-12 DIAGNOSIS — Z0000 Encounter for general adult medical examination without abnormal findings: Secondary | ICD-10-CM | POA: Diagnosis not present

## 2016-02-12 DIAGNOSIS — J301 Allergic rhinitis due to pollen: Secondary | ICD-10-CM

## 2016-02-12 DIAGNOSIS — C61 Malignant neoplasm of prostate: Secondary | ICD-10-CM

## 2016-02-12 DIAGNOSIS — E785 Hyperlipidemia, unspecified: Secondary | ICD-10-CM

## 2016-02-12 DIAGNOSIS — J45990 Exercise induced bronchospasm: Secondary | ICD-10-CM | POA: Diagnosis not present

## 2016-02-12 LAB — CBC WITH DIFFERENTIAL/PLATELET
BASOS ABS: 0 10*3/uL (ref 0.0–0.1)
BASOS PCT: 0.4 % (ref 0.0–3.0)
EOS ABS: 0.1 10*3/uL (ref 0.0–0.7)
Eosinophils Relative: 2.1 % (ref 0.0–5.0)
HEMATOCRIT: 40.2 % (ref 39.0–52.0)
HEMOGLOBIN: 13.7 g/dL (ref 13.0–17.0)
LYMPHS PCT: 24.7 % (ref 12.0–46.0)
Lymphs Abs: 1.7 10*3/uL (ref 0.7–4.0)
MCHC: 34.2 g/dL (ref 30.0–36.0)
MCV: 93.9 fl (ref 78.0–100.0)
MONOS PCT: 13.8 % — AB (ref 3.0–12.0)
Monocytes Absolute: 0.9 10*3/uL (ref 0.1–1.0)
NEUTROS ABS: 4 10*3/uL (ref 1.4–7.7)
Neutrophils Relative %: 59 % (ref 43.0–77.0)
Platelets: 269 10*3/uL (ref 150.0–400.0)
RBC: 4.28 Mil/uL (ref 4.22–5.81)
RDW: 14.1 % (ref 11.5–15.5)
WBC: 6.8 10*3/uL (ref 4.0–10.5)

## 2016-02-12 LAB — COMPREHENSIVE METABOLIC PANEL
ALBUMIN: 4.3 g/dL (ref 3.5–5.2)
ALT: 29 U/L (ref 0–53)
AST: 27 U/L (ref 0–37)
Alkaline Phosphatase: 69 U/L (ref 39–117)
BUN: 18 mg/dL (ref 6–23)
CALCIUM: 9.3 mg/dL (ref 8.4–10.5)
CHLORIDE: 105 meq/L (ref 96–112)
CO2: 31 mEq/L (ref 19–32)
CREATININE: 1.12 mg/dL (ref 0.40–1.50)
GFR: 69.74 mL/min (ref >60.00)
Glucose, Bld: 78 mg/dL (ref 70–99)
Potassium: 4.4 mEq/L (ref 3.5–5.1)
Sodium: 140 mEq/L (ref 135–145)
Total Bilirubin: 0.4 mg/dL (ref 0.2–1.2)
Total Protein: 6.9 g/dL (ref 6.0–8.3)

## 2016-02-12 LAB — LIPID PANEL
CHOL/HDL RATIO: 2
CHOLESTEROL: 109 mg/dL (ref 0–200)
HDL: 49.9 mg/dL (ref >39.00)
LDL CALC: 44 mg/dL (ref 0–99)
NonHDL: 58.86
TRIGLYCERIDES: 76 mg/dL (ref 0.0–149.0)
VLDL: 15.2 mg/dL (ref 0.0–40.0)

## 2016-02-12 LAB — PSA: PSA: 0 ng/mL — ABNORMAL LOW (ref 0.10–4.00)

## 2016-02-12 MED ORDER — FLUTICASONE PROPIONATE 50 MCG/ACT NA SUSP: 2.0000 | Freq: Every day | NASAL | 3 refills | Status: DC

## 2016-02-12 MED ORDER — MONTELUKAST SODIUM 10 MG PO TABS: 10.0000 mg | ORAL_TABLET | Freq: Every day | ORAL | 3 refills | Status: DC

## 2016-02-12 MED FILL — FLUTICASONE PROP 50 MCG SPR: 50 | 90 days supply | Qty: 48 | Fill #0

## 2016-02-12 MED FILL — MONTELUKAST SOD 10 MG TAB: 10 | 90 days supply | Qty: 90 | Fill #0

## 2016-02-12 NOTE — Patient Instructions (Signed)

## 2016-02-12 NOTE — Progress Notes (Signed)
Subjective:  Patient ID: Dustin Burton, male    DOB: 02/28/1950  Age: 66 y.o. MRN: [id number]  CC: Annual Exam; Hyperlipidemia; and Allergic Rhinitis    HPI [name] presents for a CPX.  He feels well and offers no complaints. He is doing well on the statin with no muscle or joint aches. He is 7 years status post TURP for prostate cancer and needs a follow-up PSA test.  Past Medical History:  Diagnosis Date  . Brachial plexitis   . Elevated LFTs 07/2001  . Hemorrhoids   . Hypercholesteremia   . IBS (irritable bowel syndrome)   . Prostate cancer ([place])   . Seasonal allergies    Past Surgical History:  Procedure Laterality Date  . HAND SURGERY Left    index finger  . KNEE SURGERY Right    right  . PROSTATECTOMY      reports that he has never smoked. He has never used smokeless tobacco. He reports that he drinks alcohol. He reports that he does not use drugs. family history includes Alzheimer's disease in his father; Colon cancer in his father; Coronary artery disease in his mother; Crohn's disease in his father and mother; Cystic fibrosis in his other; Hyperlipidemia in his father and mother; Hypertension in his father and mother. Allergies  Allergen Reactions  . Tadalafil Other (See Comments)    Muscular leg pain   Past Medical History:  Diagnosis Date  . Brachial plexitis   . Elevated LFTs 07/2001  . Hemorrhoids   . Hypercholesteremia   . IBS (irritable bowel syndrome)   . Prostate cancer ([name])   . Seasonal allergies    Past Surgical History:  Procedure Laterality Date  . HAND SURGERY Left    index finger  . KNEE SURGERY Right    right  . PROSTATECTOMY      reports that he has never smoked. He has never used smokeless tobacco. He reports that he drinks alcohol. He reports that he does not use drugs. family history includes Alzheimer's disease in his father; Colon cancer in his father; Coronary artery disease in his mother; Crohn's disease in his father and  mother; Cystic fibrosis in his other; Hyperlipidemia in his father and mother; Hypertension in his father and mother. Allergies  Allergen Reactions  . Tadalafil Other (See Comments)    Muscular leg pain    Outpatient Medications Prior to Visit  Medication Sig Dispense Refill  . albuterol (PROAIR HFA) 108 (90 BASE) MCG/ACT inhaler 2 puffs 3o minutes before strenuous exercise, every 4 to 6 hours as needed for shortness of breath. 2 Inhaler 5  . aspirin EC 81 MG tablet Take 1 tablet (81 mg total) by mouth daily.    [name] atorvastatin (LIPITOR) 40 MG tablet Take 1 tablet (40 mg total) by mouth daily. 90 tablet 3  . Probiotic Product (PROBIOTIC DAILY PO) Take 1 capsule by mouth daily.    [name] ZETIA 10 MG tablet TAKE 1 TABLET BY MOUTH DAILY. 90 tablet 3  . fluticasone (FLONASE) 50 MCG/ACT nasal spray   1  . montelukast (SINGULAIR) 10 MG tablet Take 1 tablet (10 mg total) by mouth at bedtime. 30 tablet 11  . benzonatate (TESSALON) 100 MG capsule Take 1 capsule (100 mg total) by mouth 2 (two) times daily as needed for cough. 20 capsule 0  . doxycycline (VIBRA-TABS) 100 MG tablet Take 1 tablet (100 mg total) by mouth 2 (two) times daily. 20 tablet 0  . Multiple Vitamin (  MULTIVITAMIN) tablet Take 1 tablet by mouth daily.      . nitroGLYCERIN (NITRODUR - DOSED IN MG/24 HR) 0.2 mg/hr patch Apply 1/4th patch to affected ankle, change daily (Patient not taking: Reported on 02/12/2016) 30 patch 1   No facility-administered medications prior to visit.     ROS Review of Systems  Constitutional: Negative.  Negative for activity change, appetite change, chills, diaphoresis, fatigue and unexpected weight change.  HENT: Negative.   Eyes: Negative for visual disturbance.  Respiratory: Negative for cough, chest tightness, shortness of breath and wheezing.   Cardiovascular: Negative for chest pain, palpitations and leg swelling.  Gastrointestinal: Negative for abdominal pain, constipation, diarrhea, nausea and  vomiting.  Endocrine: Negative.   Genitourinary: Negative.  Negative for difficulty urinating, dysuria, penile swelling, scrotal swelling and testicular pain.  Musculoskeletal: Negative.  Negative for back pain, myalgias and neck pain.  Skin: Negative.   Allergic/Immunologic: Negative.   Neurological: Negative.  Negative for dizziness and weakness.  Hematological: Negative.  Negative for adenopathy. Does not bruise/bleed easily.  Psychiatric/Behavioral: Negative.     Objective:  BP 130/80 (BP Location: Left Arm, Patient Position: Sitting, Cuff Size: Normal)   Pulse 65   Temp 97.9 F (36.6 C) (Oral)   Resp 16   Ht 5\' 11"  (1.803 m)   Wt 169 lb 12 oz (77 kg)   SpO2 98%   BMI 23.68 kg/m   BP Readings from Last 3 Encounters:  02/12/16 130/80  09/28/15 (!) 147/73  08/17/15 140/82    Wt Readings from Last 3 Encounters:  02/12/16 169 lb 12 oz (77 kg)  09/28/15 156 lb (70.8 kg)  08/17/15 164 lb 3.2 oz (74.5 kg)    Physical Exam  Constitutional: He is oriented to person, place, and time. No distress.  HENT:  Mouth/Throat: Oropharynx is clear and moist. No oropharyngeal exudate.  Eyes: Conjunctivae are normal. Right eye exhibits no discharge. Left eye exhibits no discharge. No scleral icterus.  Neck: Normal range of motion. Neck supple. No JVD present. No tracheal deviation present. No thyromegaly present.  Cardiovascular: Normal rate, regular rhythm, normal heart sounds and intact distal pulses.  Exam reveals no gallop and no friction rub.   No murmur heard. Pulmonary/Chest: Effort normal and breath sounds normal. No stridor. No respiratory distress. He has no wheezes. He has no rales. He exhibits no tenderness.  Abdominal: Soft. Bowel sounds are normal. He exhibits no distension and no mass. There is no tenderness. There is no rebound and no guarding. Hernia confirmed negative in the right inguinal area and confirmed negative in the left inguinal area.  Genitourinary: Rectum  normal, prostate normal, testes normal and penis normal. Rectal exam shows no external hemorrhoid, no internal hemorrhoid, no fissure, no mass, no tenderness, anal tone normal and guaiac negative stool. Prostate is not enlarged and not tender. Right testis shows no mass, no swelling and no tenderness. Right testis is descended. Left testis shows no mass, no swelling and no tenderness. Left testis is descended. Circumcised. No penile erythema or penile tenderness. No discharge found.  Musculoskeletal: He exhibits no edema, tenderness or deformity.  Lymphadenopathy:    He has no cervical adenopathy.       Right: No inguinal adenopathy present.       Left: No inguinal adenopathy present.  Neurological: He is alert and oriented to person, place, and time.  Skin: Skin is warm and dry. No rash noted. He is not diaphoretic. No erythema. No pallor.  Vitals reviewed.  Lab Results  Component Value Date   WBC 6.8 02/12/2016   HGB 13.7 02/12/2016   HCT 40.2 02/12/2016   PLT 269.0 02/12/2016   GLUCOSE 78 02/12/2016   CHOL 109 02/12/2016   TRIG 76.0 02/12/2016   HDL 49.90 02/12/2016   LDLDIRECT 112 (H) 08/02/2011   LDLCALC 44 02/12/2016   ALT 29 02/12/2016   AST 27 02/12/2016   NA 140 02/12/2016   K 4.4 02/12/2016   CL 105 02/12/2016   CREATININE 1.12 02/12/2016   BUN 18 02/12/2016   CO2 31 02/12/2016   TSH 1.18 02/12/2016   PSA 0.00 (L) 02/12/2016    Dg Chest 2 View  Result Date: 02/09/2015 CLINICAL DATA:  Pulmonary scarring.  Routine follow-up. EXAM: CHEST  2 VIEW COMPARISON:  CT 11/22/2013.  Chest x-ray 11/04/2013. FINDINGS: Mediastinum hilar structures normal. Stable biapical pleural parenchymal thickening and nodularity noted consistent with scarring stable mild interstitial prominence noted bilateral. Heart size normal. No pleural effusion or pneumothorax. Degenerative changes thoracic spine. IMPRESSION: Stable bilateral pleural-parenchymal scarring and chronic interstitial lung  disease. No acute or new focal abnormality . Electronically Signed   By: Marcello Moores  Register   On: 02/09/2015 11:48    Assessment & Plan:   Libero was seen today for annual exam, hyperlipidemia and allergic rhinitis .  Diagnoses and all orders for this visit:  Hyperlipidemia with target LDL less than 70- He has achieved his LDL goal is doing well on the statin -     Thyroid Panel With TSH; Future -     Lipid panel; Future -     Comprehensive metabolic panel; Future -     CBC with Differential/Platelet; Future  Routine general medical examination at a health care facility  ADENOCARCINOMA, PROSTATE- there is no appreciable prostate tissue on exam and his PSA remains undetectable, there is no evidence of recurrence of prostate cancer -     PSA; Future  Exercise-induced bronchospasm- his symptoms are well controlled with Singulair -     montelukast (SINGULAIR) 10 MG tablet; Take 1 tablet (10 mg total) by mouth at bedtime.  Chronic seasonal allergic rhinitis due to pollen -     fluticasone (FLONASE) 50 MCG/ACT nasal spray; Place 2 sprays into both nostrils daily. -     montelukast (SINGULAIR) 10 MG tablet; Take 1 tablet (10 mg total) by mouth at bedtime.   I have discontinued Mr. Abraha multivitamin, nitroGLYCERIN, doxycycline, and benzonatate. I have also changed his fluticasone. Additionally, I am having him maintain his Probiotic Product (PROBIOTIC DAILY PO), albuterol, aspirin EC, ZETIA, atorvastatin, and montelukast.  Meds ordered this encounter  Medications  . fluticasone (FLONASE) 50 MCG/ACT nasal spray    Sig: Place 2 sprays into both nostrils daily.    Dispense:  48 g    Refill:  3  . montelukast (SINGULAIR) 10 MG tablet    Sig: Take 1 tablet (10 mg total) by mouth at bedtime.    Dispense:  90 tablet    Refill:  3   See AVS for instructions about healthy living and anticipatory guidance.  Follow-up: Return in about 6 months (around 08/11/2016).  Scarlette Calico, MD

## 2016-02-12 NOTE — Progress Notes (Signed)
Pre visit review using our clinic review tool, if applicable. No additional management support is needed unless otherwise documented below in the visit note. 

## 2016-02-13 ENCOUNTER — Encounter: Payer: Self-pay | Admitting: Internal Medicine

## 2016-02-13 LAB — THYROID PANEL WITH TSH
Free Thyroxine Index: 2.6 (ref 1.4–3.8)
T3 Uptake: 28 % (ref 22–35)
T4, Total: 9.2 ug/dL (ref 4.5–12.0)
TSH: 1.18 mIU/L (ref 0.40–4.50)

## 2016-02-16 ENCOUNTER — Encounter: Payer: Self-pay | Admitting: Internal Medicine

## 2016-02-18 NOTE — Assessment & Plan Note (Signed)

## 2016-03-11 MED FILL — ATORVASTATIN 40 MG TABLET: 40 | 90 days supply | Qty: 90 | Fill #1

## 2016-03-11 MED FILL — ZETIA 10 MG TABLET: 10 | 90 days supply | Qty: 90 | Fill #1

## 2016-05-05 ENCOUNTER — Telehealth: Payer: Medicare Other | Admitting: Family

## 2016-05-05 DIAGNOSIS — L259 Unspecified contact dermatitis, unspecified cause: Secondary | ICD-10-CM

## 2016-05-05 MED ORDER — PREDNISONE 10 MG (21) PO TBPK: ORAL_TABLET | ORAL | 0 refills | Status: DC

## 2016-05-05 NOTE — Progress Notes (Signed)
E Visit for Rash  We are sorry that you are not feeling well. Here is how we plan to help!  Based on what you shared with me it looks like you have contact dermatitis.  Contact dermatitis is a skin rash caused by something that touches the skin and causes irritation or inflammation.  Your skin may be red, swollen, dry, cracked, and itch.  The rash should go away in a few days but can last a few weeks.  If you get a rash, it's important to figure out what caused it so the irritant can be avoided in the future. and I have prescribed Prednisone 10 mg daily for 5 days  As discussed, if this does not improve this could be shingles. Please follow up with your primary care provider.   HOME CARE:   Take cool showers and avoid direct sunlight.  Apply cool compress or wet dressings.  Take a bath in an oatmeal bath.  Sprinkle content of one Aveeno packet under running faucet with comfortably warm water.  Bathe for 15-20 minutes, 1-2 times daily.  Pat dry with a towel. Do not rub the rash.  Use hydrocortisone cream.  Take an antihistamine like Benadryl for widespread rashes that itch.  The adult dose of Benadryl is 25-50 mg by mouth 4 times daily.  Caution:  This type of medication may cause sleepiness.  Do not drink alcohol, drive, or operate dangerous machinery while taking antihistamines.  Do not take these medications if you have prostate enlargement.  Read package instructions thoroughly on all medications that you take.  GET HELP RIGHT AWAY IF:   Symptoms don't go away after treatment.  Severe itching that persists.  If you rash spreads or swells.  If you rash begins to smell.  If it blisters and opens or develops a yellow-brown crust.  You develop a fever.  You have a sore throat.  You become short of breath.  MAKE SURE YOU:  Understand these instructions. Will watch your condition. Will get help right away if you are not doing well or get worse.  Thank you for choosing an  e-visit. Your e-visit answers were reviewed by a board certified advanced clinical practitioner to complete your personal care plan. Depending upon the condition, your plan could have included both over the counter or prescription medications. Please review your pharmacy choice. Be sure that the pharmacy you have chosen is open so that you can pick up your prescription now.  If there is a problem you may message your provider in [facility] to have the prescription routed to another pharmacy. Your safety is important to [place]. If you have drug allergies check your prescription carefully.  For the next 24 hours, you can use MyChart to ask questions about today's visit, request a non-urgent call back, or ask for a work or school excuse from your e-visit provider. You will get an email in the next two days asking about your experience. I hope that your e-visit has been valuable and will speed your recovery.

## 2016-05-06 ENCOUNTER — Encounter: Payer: Self-pay | Admitting: Internal Medicine

## 2016-05-07 ENCOUNTER — Ambulatory Visit (INDEPENDENT_AMBULATORY_CARE_PROVIDER_SITE_OTHER): Payer: Medicare Other | Admitting: Internal Medicine

## 2016-05-07 ENCOUNTER — Encounter: Payer: Self-pay | Admitting: Internal Medicine

## 2016-05-07 VITALS — BP 140/72 | HR 73 | Temp 97.9°F | Resp 16 | Ht 71.0 in | Wt 169.0 lb

## 2016-05-07 DIAGNOSIS — B029 Zoster without complications: Secondary | ICD-10-CM | POA: Diagnosis not present

## 2016-05-07 MED ORDER — OXYCODONE-ACETAMINOPHEN 7.5-325 MG PO TABS: 1.0000 | ORAL_TABLET | Freq: Four times a day (QID) | ORAL | 0 refills | Status: DC | PRN

## 2016-05-07 MED ORDER — VALACYCLOVIR HCL 1 G PO TABS: 1000.0000 mg | ORAL_TABLET | Freq: Three times a day (TID) | ORAL | 0 refills | Status: AC

## 2016-05-07 MED ORDER — PROMETHAZINE HCL 12.5 MG PO TABS: 12.5000 mg | ORAL_TABLET | Freq: Four times a day (QID) | ORAL | 0 refills | Status: DC | PRN

## 2016-05-07 MED FILL — OXYCODON-ACETAMINOPHEN 7.5-: 7.5-325 | 8 days supply | Qty: 35 | Fill #0

## 2016-05-07 MED FILL — valACYclovir HCL 1 GM TABS: 1 | 7 days supply | Qty: 21 | Fill #0

## 2016-05-07 MED FILL — PROMETHAZINE 12.5 MG TABLET: 12.5 | 7 days supply | Qty: 30 | Fill #0

## 2016-05-07 NOTE — Patient Instructions (Signed)
Shingles Shingles, which is also known as herpes zoster, is an infection that causes a painful skin rash and fluid-filled blisters. Shingles is not related to genital herpes, which is a sexually transmitted infection. Shingles only develops in people who:  Have had chickenpox.  Have received the chickenpox vaccine. (This is rare.)  What are the causes? Shingles is caused by varicella-zoster virus (VZV). This is the same virus that causes chickenpox. After exposure to VZV, the virus stays in the body in an inactive (dormant) state. Shingles develops if the virus reactivates. This can happen many years after the initial exposure to VZV. It is not known what causes this virus to reactivate. What increases the risk? People who have had chickenpox or received the chickenpox vaccine are at risk for shingles. Infection is more common in people who:  Are older than age 50.  Have a weakened defense (immune) system, such as those with HIV, AIDS, or cancer.  Are taking medicines that weaken the immune system, such as transplant medicines.  Are under great stress.  What are the signs or symptoms? Early symptoms of this condition include itching, tingling, and pain in an area on your skin. Pain may be described as burning, stabbing, or throbbing. A few days or weeks after symptoms start, a painful red rash appears, usually on one side of the body in a bandlike or beltlike pattern. The rash eventually turns into fluid-filled blisters that break open, scab over, and dry up in about 2-3 weeks. At any time during the infection, you may also develop:  A fever.  Chills.  A headache.  An upset stomach.  How is this diagnosed? This condition is diagnosed with a skin exam. Sometimes, skin or fluid samples are taken from the blisters before a diagnosis is made. These samples are examined under a microscope or sent to a lab for testing. How is this treated? There is no specific cure for this condition.  Your health care provider will probably prescribe medicines to help you manage pain, recover more quickly, and avoid long-term problems. Medicines may include:  Antiviral drugs.  Anti-inflammatory drugs.  Pain medicines.  If the area involved is on your face, you may be referred to a specialist, such as an eye doctor (ophthalmologist) or an ear, nose, and throat (ENT) doctor to help you avoid eye problems, chronic pain, or disability. Follow these instructions at home: Medicines  Take medicines only as directed by your health care provider.  Apply an anti-itch or numbing cream to the affected area as directed by your health care provider. Blister and Rash Care  Take a cool bath or apply cool compresses to the area of the rash or blisters as directed by your health care provider. This may help with pain and itching.  Keep your rash covered with a loose bandage (dressing). Wear loose-fitting clothing to help ease the pain of material rubbing against the rash.  Keep your rash and blisters clean with mild soap and cool water or as directed by your health care provider.  Check your rash every day for signs of infection. These include redness, swelling, and pain that lasts or increases.  Do not pick your blisters.  Do not scratch your rash. General instructions  Rest as directed by your health care provider.  Keep all follow-up visits as directed by your health care provider. This is important.  Until your blisters scab over, your infection can cause chickenpox in people who have never had it or been vaccinated   against it. To prevent this from happening, avoid contact with other people, especially: ? Babies. ? Pregnant women. ? Children who have eczema. ? Elderly people who have transplants. ? People who have chronic illnesses, such as leukemia or AIDS. Contact a health care provider if:  Your pain is not relieved with prescribed medicines.  Your pain does not get better after  the rash heals.  Your rash looks infected. Signs of infection include redness, swelling, and pain that lasts or increases. Get help right away if:  The rash is on your face or nose.  You have facial pain, pain around your eye area, or loss of feeling on one side of your face.  You have ear pain or you have ringing in your ear.  You have loss of taste.  Your condition gets worse. This information is not intended to replace advice given to you by your health care provider. Make sure you discuss any questions you have with your health care provider. Document Released: 01/07/2005 Document Revised: 09/03/2015 Document Reviewed: 11/18/2013 Elsevier Interactive Patient Education  2017 [employer]  

## 2016-05-07 NOTE — Progress Notes (Signed)
Subjective:  Patient ID: Dustin Burton, male    DOB: 10/13/1950  Age: 66 y.o. MRN: [id number]  CC: Rash   HPI Dustin Burton presents for a 3 day history of painful rash along his right back and right anterior torso with redness and blisters. He did an evisit 2 days ago and was prescribed steroids which are not helping.  Outpatient Medications Prior to Visit  Medication Sig Dispense Refill  . albuterol (PROAIR HFA) 108 (90 BASE) MCG/ACT inhaler 2 puffs 3o minutes before strenuous exercise, every 4 to 6 hours as needed for shortness of breath. 2 Inhaler 5  . aspirin EC 81 MG tablet Take 1 tablet (81 mg total) by mouth daily.    [name] atorvastatin (LIPITOR) 40 MG tablet Take 1 tablet (40 mg total) by mouth daily. 90 tablet 3  . fluticasone (FLONASE) 50 MCG/ACT nasal spray Place 2 sprays into both nostrils daily. 48 g 3  . montelukast (SINGULAIR) 10 MG tablet Take 1 tablet (10 mg total) by mouth at bedtime. 90 tablet 3  . Probiotic Product (PROBIOTIC DAILY PO) Take 1 capsule by mouth daily.    [name] ZETIA 10 MG tablet TAKE 1 TABLET BY MOUTH DAILY. 90 tablet 3  . predniSONE (STERAPRED UNI-PAK 21 TAB) 10 MG (21) TBPK tablet Use as directed 21 tablet 0   No facility-administered medications prior to visit.     ROS Review of Systems  Constitutional: Negative for chills, diaphoresis, fatigue and fever.  HENT: Negative.  Negative for sore throat.   Eyes: Negative for pain, redness and visual disturbance.  Respiratory: Negative for cough and shortness of breath.   Cardiovascular: Negative for chest pain, palpitations and leg swelling.  Gastrointestinal: Negative for abdominal pain, diarrhea, nausea and vomiting.  Endocrine: Negative.   Genitourinary: Negative.   Musculoskeletal: Negative.   Skin: Positive for rash.  Allergic/Immunologic: Negative.   Neurological: Negative.   Hematological: Negative.  Negative for adenopathy. Does not bruise/bleed easily.  Psychiatric/Behavioral: Negative.      Objective:  BP 140/72 (BP Location: Left Arm, Patient Position: Sitting, Cuff Size: Normal)   Pulse 73   Temp 97.9 F (36.6 C) (Oral)   Resp 16   Ht 5\' 11"  (1.803 m)   Wt 169 lb (76.7 kg)   SpO2 97%   BMI 23.57 kg/m   BP Readings from Last 3 Encounters:  05/07/16 140/72  02/12/16 130/80  09/28/15 (!) 147/73    Wt Readings from Last 3 Encounters:  05/07/16 169 lb (76.7 kg)  02/12/16 169 lb 12 oz (77 kg)  09/28/15 156 lb (70.8 kg)    Physical Exam  Constitutional: He is oriented to person, place, and time.  Non-toxic appearance. He does not have a sickly appearance. He does not appear ill. No distress.  HENT:  Mouth/Throat: Oropharynx is clear and moist. No oropharyngeal exudate.  Eyes: Conjunctivae are normal. Right eye exhibits no discharge. Left eye exhibits no discharge. No scleral icterus.  Neck: Normal range of motion. Neck supple. No JVD present. No tracheal deviation present. No thyromegaly present.  Cardiovascular: Normal rate, regular rhythm, normal heart sounds and intact distal pulses.  Exam reveals no gallop and no friction rub.   No murmur heard. Pulmonary/Chest: Effort normal and breath sounds normal. No stridor. No respiratory distress. He has no wheezes. He has no rales. He exhibits no tenderness.  Abdominal: Soft. Bowel sounds are normal. He exhibits no distension and no mass. There is no tenderness. There is no rebound  and no guarding.  Musculoskeletal: Normal range of motion. He exhibits no edema, tenderness or deformity.  Lymphadenopathy:    He has no cervical adenopathy.  Neurological: He is oriented to person, place, and time.  Skin: Skin is warm and dry. Rash noted. No purpura noted. Rash is vesicular. Rash is not macular, not papular, not maculopapular, not nodular, not pustular and not urticarial. He is not diaphoretic. There is erythema. No pallor.     Vitals reviewed.   Lab Results  Component Value Date   WBC 6.8 02/12/2016   HGB 13.7  02/12/2016   HCT 40.2 02/12/2016   PLT 269.0 02/12/2016   GLUCOSE 78 02/12/2016   CHOL 109 02/12/2016   TRIG 76.0 02/12/2016   HDL 49.90 02/12/2016   LDLDIRECT 112 (H) 08/02/2011   LDLCALC 44 02/12/2016   ALT 29 02/12/2016   AST 27 02/12/2016   NA 140 02/12/2016   K 4.4 02/12/2016   CL 105 02/12/2016   CREATININE 1.12 02/12/2016   BUN 18 02/12/2016   CO2 31 02/12/2016   TSH 1.18 02/12/2016   PSA 0.00 (L) 02/12/2016    Dg Chest 2 View  Result Date: 02/09/2015 CLINICAL DATA:  Pulmonary scarring.  Routine follow-up. EXAM: CHEST  2 VIEW COMPARISON:  CT 11/22/2013.  Chest x-ray 11/04/2013. FINDINGS: Mediastinum hilar structures normal. Stable biapical pleural parenchymal thickening and nodularity noted consistent with scarring stable mild interstitial prominence noted bilateral. Heart size normal. No pleural effusion or pneumothorax. Degenerative changes thoracic spine. IMPRESSION: Stable bilateral pleural-parenchymal scarring and chronic interstitial lung disease. No acute or new focal abnormality . Electronically Signed   By: Marcello Moores  Register   On: 02/09/2015 11:48    Assessment & Plan:   [name] was seen today for rash.  Diagnoses and all orders for this visit:  Herpes zoster without complication- Will start treating with Valtrex 1000 milligrams 3 times a day and will control the pain with Percocet as needed, I've also offered a prescription for promethazine as needed in the event that some of these medications cause side effects such as nausea or vomiting. -     valACYclovir (VALTREX) 1000 MG tablet; Take 1 tablet (1,000 mg total) by mouth 3 (three) times daily. -     oxyCODONE-acetaminophen (PERCOCET) 7.5-325 MG tablet; Take 1 tablet by mouth every 6 (six) hours as needed. -     promethazine (PHENERGAN) 12.5 MG tablet; Take 1 tablet (12.5 mg total) by mouth every 6 (six) hours as needed for nausea or vomiting.   I have discontinued [name] predniSONE. I am also having him start  on valACYclovir, oxyCODONE-acetaminophen, and promethazine. Additionally, I am having him maintain his Probiotic Product (PROBIOTIC DAILY PO), albuterol, aspirin EC, ZETIA, atorvastatin, fluticasone, and montelukast.  Meds ordered this encounter  Medications  . valACYclovir (VALTREX) 1000 MG tablet    Sig: Take 1 tablet (1,000 mg total) by mouth 3 (three) times daily.    Dispense:  21 tablet    Refill:  0  . oxyCODONE-acetaminophen (PERCOCET) 7.5-325 MG tablet    Sig: Take 1 tablet by mouth every 6 (six) hours as needed.    Dispense:  35 tablet    Refill:  0  . promethazine (PHENERGAN) 12.5 MG tablet    Sig: Take 1 tablet (12.5 mg total) by mouth every 6 (six) hours as needed for nausea or vomiting.    Dispense:  30 tablet    Refill:  0     Follow-up: Return in about 4  weeks (around 06/04/2016).  Scarlette Calico, MD

## 2016-05-07 NOTE — Progress Notes (Signed)
Pre visit review using our clinic review tool, if applicable. No additional management support is needed unless otherwise documented below in the visit note. 

## 2016-05-27 ENCOUNTER — Ambulatory Visit (INDEPENDENT_AMBULATORY_CARE_PROVIDER_SITE_OTHER): Payer: Medicare Other | Admitting: Internal Medicine

## 2016-05-27 ENCOUNTER — Encounter: Payer: Self-pay | Admitting: Internal Medicine

## 2016-05-27 VITALS — BP 134/74 | HR 52 | Temp 98.1°F | Resp 16 | Ht 71.0 in | Wt 167.0 lb

## 2016-05-27 DIAGNOSIS — B029 Zoster without complications: Secondary | ICD-10-CM | POA: Diagnosis not present

## 2016-05-27 MED ORDER — ZOSTER VAC RECOMB ADJUVANTED 50 MCG/0.5ML IM SUSR: 0.5000 mL | Freq: Once | INTRAMUSCULAR | 1 refills | Status: AC

## 2016-05-27 NOTE — Patient Instructions (Signed)
Shingles Shingles, which is also known as herpes zoster, is an infection that causes a painful skin rash and fluid-filled blisters. Shingles is not related to genital herpes, which is a sexually transmitted infection. Shingles only develops in people who:  Have had chickenpox.  Have received the chickenpox vaccine. (This is rare.)  What are the causes? Shingles is caused by varicella-zoster virus (VZV). This is the same virus that causes chickenpox. After exposure to VZV, the virus stays in the body in an inactive (dormant) state. Shingles develops if the virus reactivates. This can happen many years after the initial exposure to VZV. It is not known what causes this virus to reactivate. What increases the risk? People who have had chickenpox or received the chickenpox vaccine are at risk for shingles. Infection is more common in people who:  Are older than age 50.  Have a weakened defense (immune) system, such as those with HIV, AIDS, or cancer.  Are taking medicines that weaken the immune system, such as transplant medicines.  Are under great stress.  What are the signs or symptoms? Early symptoms of this condition include itching, tingling, and pain in an area on your skin. Pain may be described as burning, stabbing, or throbbing. A few days or weeks after symptoms start, a painful red rash appears, usually on one side of the body in a bandlike or beltlike pattern. The rash eventually turns into fluid-filled blisters that break open, scab over, and dry up in about 2-3 weeks. At any time during the infection, you may also develop:  A fever.  Chills.  A headache.  An upset stomach.  How is this diagnosed? This condition is diagnosed with a skin exam. Sometimes, skin or fluid samples are taken from the blisters before a diagnosis is made. These samples are examined under a microscope or sent to a lab for testing. How is this treated? There is no specific cure for this condition.  Your health care provider will probably prescribe medicines to help you manage pain, recover more quickly, and avoid long-term problems. Medicines may include:  Antiviral drugs.  Anti-inflammatory drugs.  Pain medicines.  If the area involved is on your face, you may be referred to a specialist, such as an eye doctor (ophthalmologist) or an ear, nose, and throat (ENT) doctor to help you avoid eye problems, chronic pain, or disability. Follow these instructions at home: Medicines  Take medicines only as directed by your health care provider.  Apply an anti-itch or numbing cream to the affected area as directed by your health care provider. Blister and Rash Care  Take a cool bath or apply cool compresses to the area of the rash or blisters as directed by your health care provider. This may help with pain and itching.  Keep your rash covered with a loose bandage (dressing). Wear loose-fitting clothing to help ease the pain of material rubbing against the rash.  Keep your rash and blisters clean with mild soap and cool water or as directed by your health care provider.  Check your rash every day for signs of infection. These include redness, swelling, and pain that lasts or increases.  Do not pick your blisters.  Do not scratch your rash. General instructions  Rest as directed by your health care provider.  Keep all follow-up visits as directed by your health care provider. This is important.  Until your blisters scab over, your infection can cause chickenpox in people who have never had it or been vaccinated   against it. To prevent this from happening, avoid contact with other people, especially: ? Babies. ? Pregnant women. ? Children who have eczema. ? Elderly people who have transplants. ? People who have chronic illnesses, such as leukemia or AIDS. Contact a health care provider if:  Your pain is not relieved with prescribed medicines.  Your pain does not get better after  the rash heals.  Your rash looks infected. Signs of infection include redness, swelling, and pain that lasts or increases. Get help right away if:  The rash is on your face or nose.  You have facial pain, pain around your eye area, or loss of feeling on one side of your face.  You have ear pain or you have ringing in your ear.  You have loss of taste.  Your condition gets worse. This information is not intended to replace advice given to you by your health care provider. Make sure you discuss any questions you have with your health care provider. Document Released: 01/07/2005 Document Revised: 09/03/2015 Document Reviewed: 11/18/2013 Elsevier Interactive Patient Education  2017 [employer]  

## 2016-05-27 NOTE — Progress Notes (Signed)
Subjective:  Patient ID: Dustin Burton, male    DOB: December 07, 1950  Age: 66 y.o. MRN: [id number]  CC: Rash   HPI [name] presents for a follow-up on rash after a diagnosis of shingles about 3 weeks ago. He tells me the rash is resolving and he hasn't taken anything for pain for the last 5 days. He reports a mild tingling sensation in the area but offers no other complaints.  Outpatient Medications Prior to Visit  Medication Sig Dispense Refill  . albuterol (PROAIR HFA) 108 (90 BASE) MCG/ACT inhaler 2 puffs 3o minutes before strenuous exercise, every 4 to 6 hours as needed for shortness of breath. 2 Inhaler 5  . aspirin EC 81 MG tablet Take 1 tablet (81 mg total) by mouth daily.    [name] atorvastatin (LIPITOR) 40 MG tablet Take 1 tablet (40 mg total) by mouth daily. 90 tablet 3  . fluticasone (FLONASE) 50 MCG/ACT nasal spray Place 2 sprays into both nostrils daily. 48 g 3  . montelukast (SINGULAIR) 10 MG tablet Take 1 tablet (10 mg total) by mouth at bedtime. 90 tablet 3  . Probiotic Product (PROBIOTIC DAILY PO) Take 1 capsule by mouth daily.    [name] ZETIA 10 MG tablet TAKE 1 TABLET BY MOUTH DAILY. 90 tablet 3  . oxyCODONE-acetaminophen (PERCOCET) 7.5-325 MG tablet Take 1 tablet by mouth every 6 (six) hours as needed. 35 tablet 0  . promethazine (PHENERGAN) 12.5 MG tablet Take 1 tablet (12.5 mg total) by mouth every 6 (six) hours as needed for nausea or vomiting. 30 tablet 0   No facility-administered medications prior to visit.     ROS Review of Systems  Constitutional: Negative for chills, fatigue and fever.  HENT: Negative.  Negative for trouble swallowing.   Eyes: Negative.   Respiratory: Negative for chest tightness, shortness of breath, wheezing and stridor.   Cardiovascular: Negative for chest pain, palpitations and leg swelling.  Gastrointestinal: Negative for abdominal pain, constipation, diarrhea, nausea and vomiting.  Genitourinary: Negative.   Musculoskeletal: Negative for  back pain and myalgias.  Skin: Positive for rash.  Allergic/Immunologic: Negative.   Neurological: Negative.   Hematological: Negative for adenopathy. Does not bruise/bleed easily.  Psychiatric/Behavioral: Negative.   All other systems reviewed and are negative.   Objective:  BP 134/74 (BP Location: Left Arm, Patient Position: Sitting, Cuff Size: Normal)   Pulse (!) 52   Temp 98.1 F (36.7 C) (Oral)   Resp 16   Ht 5\' 11"  (1.803 m)   Wt 167 lb (75.8 kg)   SpO2 98%   BMI 23.29 kg/m   BP Readings from Last 3 Encounters:  05/27/16 134/74  05/07/16 140/72  02/12/16 130/80    Wt Readings from Last 3 Encounters:  05/27/16 167 lb (75.8 kg)  05/07/16 169 lb (76.7 kg)  02/12/16 169 lb 12 oz (77 kg)    Physical Exam  Constitutional: No distress.  HENT:  Mouth/Throat: Oropharynx is clear and moist. No oropharyngeal exudate.  Eyes: Conjunctivae are normal. Right eye exhibits no discharge. Left eye exhibits no discharge. No scleral icterus.  Neck: Normal range of motion. Neck supple. No JVD present. No tracheal deviation present. No thyromegaly present.  Cardiovascular: Normal rate, regular rhythm, normal heart sounds and intact distal pulses.  Exam reveals no gallop and no friction rub.   No murmur heard. Pulmonary/Chest: Effort normal and breath sounds normal. No stridor. No respiratory distress. He has no wheezes. He has no rales. He exhibits no  tenderness.  Abdominal: Soft. Bowel sounds are normal. He exhibits no distension and no mass. There is no tenderness. There is no rebound and no guarding.  Musculoskeletal: He exhibits no edema, tenderness or deformity.  Lymphadenopathy:    He has no cervical adenopathy.  Skin: Skin is warm and dry. Rash noted. Rash is papular. Rash is not nodular, not pustular and not urticarial. He is not diaphoretic. No erythema. No pallor.  There is a persistent group of papules over the right fourth thoracic dermatome consistent with what was seen  before but they are no longer vesicular or erythematous there just hyperpigmented.   Vitals reviewed.   Lab Results  Component Value Date   WBC 6.8 02/12/2016   HGB 13.7 02/12/2016   HCT 40.2 02/12/2016   PLT 269.0 02/12/2016   GLUCOSE 78 02/12/2016   CHOL 109 02/12/2016   TRIG 76.0 02/12/2016   HDL 49.90 02/12/2016   LDLDIRECT 112 (H) 08/02/2011   LDLCALC 44 02/12/2016   ALT 29 02/12/2016   AST 27 02/12/2016   NA 140 02/12/2016   K 4.4 02/12/2016   CL 105 02/12/2016   CREATININE 1.12 02/12/2016   BUN 18 02/12/2016   CO2 31 02/12/2016   TSH 1.18 02/12/2016   PSA 0.00 (L) 02/12/2016    Dg Chest 2 View  Result Date: 02/09/2015 CLINICAL DATA:  Pulmonary scarring.  Routine follow-up. EXAM: CHEST  2 VIEW COMPARISON:  CT 11/22/2013.  Chest x-ray 11/04/2013. FINDINGS: Mediastinum hilar structures normal. Stable biapical pleural parenchymal thickening and nodularity noted consistent with scarring stable mild interstitial prominence noted bilateral. Heart size normal. No pleural effusion or pneumothorax. Degenerative changes thoracic spine. IMPRESSION: Stable bilateral pleural-parenchymal scarring and chronic interstitial lung disease. No acute or new focal abnormality . Electronically Signed   By: Marcello Moores  Register   On: 02/09/2015 11:48    Assessment & Plan:   Voyd was seen today for rash.  Diagnoses and all orders for this visit:  Herpes zoster without complication- the infection has resolved, he does not have any symptoms suspicious for postherpetic neuralgia, in about 3 weeks he will present to his pharmacy to get the newest shingles vaccine. -     Zoster Vac Recomb Adjuvanted [facility]) injection; Inject 0.5 mLs into the muscle once.   I have discontinued [name] oxyCODONE-acetaminophen and promethazine. I am also having him start on Zoster Vac Recomb Adjuvanted. Additionally, I am having him maintain his Probiotic Product (PROBIOTIC DAILY PO), albuterol, aspirin EC, ZETIA,  atorvastatin, fluticasone, and montelukast.  Meds ordered this encounter  Medications  . Zoster Vac Recomb Adjuvanted [facility]) injection    Sig: Inject 0.5 mLs into the muscle once.    Dispense:  1 each    Refill:  1     Follow-up: Return if symptoms worsen or fail to improve.  Scarlette Calico, MD

## 2016-05-27 NOTE — Progress Notes (Signed)
Pre visit review using our clinic review tool, if applicable. No additional management support is needed unless otherwise documented below in the visit note. 

## 2016-05-29 MED FILL — TRIAMCINOLONE 0.1% CREAM: 0.1 | 10 days supply | Qty: 30 | Fill #0

## 2016-07-01 MED FILL — ATORVASTATIN 40 MG TABLET: 40 | 90 days supply | Qty: 90 | Fill #2

## 2016-07-18 MED FILL — SHINGRIX 50 MCG SUS: 50 | 1 days supply | Qty: 1 | Fill #0

## 2016-08-12 MED FILL — MONTELUKAST SOD 10 MG TAB: 10 | 90 days supply | Qty: 90 | Fill #1

## 2016-09-02 ENCOUNTER — Encounter: Payer: Self-pay | Admitting: Internal Medicine

## 2016-09-06 ENCOUNTER — Encounter: Payer: Self-pay | Admitting: Internal Medicine

## 2016-09-09 ENCOUNTER — Ambulatory Visit: Payer: Medicare Other | Admitting: Internal Medicine

## 2016-09-17 MED FILL — SHINGRIX 50 MCG SUS: 50 | 1 days supply | Qty: 1 | Fill #1

## 2016-09-20 ENCOUNTER — Ambulatory Visit (INDEPENDENT_AMBULATORY_CARE_PROVIDER_SITE_OTHER): Payer: Medicare Other | Admitting: Internal Medicine

## 2016-09-20 VITALS — BP 130/74 | HR 65 | Ht 71.0 in | Wt 163.0 lb

## 2016-09-20 DIAGNOSIS — I2510 Atherosclerotic heart disease of native coronary artery without angina pectoris: Secondary | ICD-10-CM

## 2016-09-20 DIAGNOSIS — B029 Zoster without complications: Secondary | ICD-10-CM | POA: Insufficient documentation

## 2016-09-20 DIAGNOSIS — E785 Hyperlipidemia, unspecified: Secondary | ICD-10-CM

## 2016-09-20 MED ORDER — EZETIMIBE 10 MG PO TABS: 5.0000 mg | ORAL_TABLET | Freq: Every day | ORAL | 3 refills | Status: DC

## 2016-09-20 MED FILL — EZETIMIBE 10 MG TAB: 10 | 90 days supply | Qty: 45 | Fill #0

## 2016-09-20 NOTE — Progress Notes (Signed)
Cardiology Office Note   Date:  09/20/2016   ID:  DAGON BUDAI, DOB [birth year], MRN [id number]  PCP:  Janith Lima, MD  Cardiologist:   Dorris Carnes, MD   F/U of CAD  History of Present Illness: Dustin Burton is a 66 y.o. male with a history of CAD (as seen by coronary calcification on CT  )  Active  I saw him in June 2017    Since seen he has done well  Breathing good  No CP  No dizziness  No palpiations  Continues to ride his bike 4 days per week Working on diet  Fasting some on days he doesn't ride    Outpatient Medications Prior to Visit  Medication Sig Dispense Refill  . albuterol (PROAIR HFA) 108 (90 BASE) MCG/ACT inhaler 2 puffs 3o minutes before strenuous exercise, every 4 to 6 hours as needed for shortness of breath. 2 Inhaler 5  . aspirin EC 81 MG tablet Take 1 tablet (81 mg total) by mouth daily.    [name] atorvastatin (LIPITOR) 40 MG tablet Take 1 tablet (40 mg total) by mouth daily. 90 tablet 3  . fluticasone (FLONASE) 50 MCG/ACT nasal spray Place 2 sprays into both nostrils daily. 48 g 3  . montelukast (SINGULAIR) 10 MG tablet Take 1 tablet (10 mg total) by mouth at bedtime. 90 tablet 3  . Probiotic Product (PROBIOTIC DAILY PO) Take 1 capsule by mouth daily.    [name] ZETIA 10 MG tablet TAKE 1 TABLET BY MOUTH DAILY. 90 tablet 3   No facility-administered medications prior to visit.      Allergies:   Tadalafil   Past Medical History:  Diagnosis Date  . Brachial plexitis   . Elevated LFTs 07/2001  . Hemorrhoids   . Hypercholesteremia   . IBS (irritable bowel syndrome)   . Prostate cancer ([place])   . Seasonal allergies     Past Surgical History:  Procedure Laterality Date  . HAND SURGERY Left    index finger  . KNEE SURGERY Right    right  . PROSTATECTOMY       Social History:  The patient  reports that he has never smoked. He has never used smokeless tobacco. He reports that he drinks alcohol. He reports that he does not use drugs.   Family History:  The  patient's family history includes Alzheimer's disease in his father; Colon cancer in his father; Coronary artery disease in his mother; Crohn's disease in his father and mother; Cystic fibrosis in his other; Hyperlipidemia in his father and mother; Hypertension in his father and mother.    ROS:  Please see the history of present illness. All other systems are reviewed and  Negative to the above problem except as noted.    PHYSICAL EXAM: VS:  BP 130/74   Pulse 65   Ht 5\' 11"  (1.803 m)   Wt 163 lb (73.9 kg)   BMI 22.73 kg/m   GEN: Well nourished, well developed, in no acute distress  HEENT: normal  Neck: no JVD, carotid bruits, or masses Cardiac: RRR; no murmurs, rubs, or gallops,no edema  Respiratory:  clear to auscultation bilaterally, normal work of breathing GI: soft, nontender, nondistended, + BS  No hepatomegaly  MS: no deformity Moving all extremities   Skin: warm and dry, no rash Neuro:  Strength and sensation are intact Psych: euthymic mood, full affect   EKG:  EKG is ordered today.  SR 65  With occasional PVC  LVH    Lipid Panel    Component Value Date/Time   CHOL 109 02/12/2016 1409   TRIG 76.0 02/12/2016 1409   HDL 49.90 02/12/2016 1409   CHOLHDL 2 02/12/2016 1409   VLDL 15.2 02/12/2016 1409   LDLCALC 44 02/12/2016 1409   LDLDIRECT 112 (H) 08/02/2011 1228      Wt Readings from Last 3 Encounters:  09/20/16 163 lb (73.9 kg)  05/27/16 167 lb (75.8 kg)  05/07/16 169 lb (76.7 kg)      ASSESSMENT AND PLAN:  1  CAD  Pt remains asymptomatic  Continue to follow    2.  HL  Excellent control  Continue statin and 5 of Zetia  F/U in 1 year  Sooner if develops CP, SOB       Disposition:   FU with  in   Signed, Dorris Carnes, MD  09/20/2016 8:59 AM    [facility], [place], [place]  [address] Phone: [phone]; Fax: [phone]

## 2016-09-20 NOTE — Patient Instructions (Signed)
Your physician recommends that you continue on your current medications as directed. Please refer to the Current Medication list given to you today. Your physician wants you to follow-up in: 1 year with Dr. Ross.  You will receive a reminder letter in the mail two months in advance. If you don't receive a letter, please call our office to schedule the follow-up appointment.  

## 2016-10-07 MED FILL — ATORVASTATIN 40 MG TABLET: 40 | 90 days supply | Qty: 90 | Fill #3

## 2016-10-31 ENCOUNTER — Ambulatory Visit (INDEPENDENT_AMBULATORY_CARE_PROVIDER_SITE_OTHER): Payer: Medicare Other | Admitting: *Deleted

## 2016-10-31 DIAGNOSIS — Z23 Encounter for immunization: Secondary | ICD-10-CM

## 2016-12-02 MED FILL — MONTELUKAST SOD 10 MG TAB: 10 | 90 days supply | Qty: 90 | Fill #2

## 2017-01-03 MED FILL — EZETIMIBE 10 MG TABS: 10 | 90 days supply | Qty: 45 | Fill #1

## 2017-01-24 ENCOUNTER — Other Ambulatory Visit: Payer: Self-pay | Admitting: Internal Medicine

## 2017-01-24 DIAGNOSIS — E785 Hyperlipidemia, unspecified: Secondary | ICD-10-CM

## 2017-01-24 MED FILL — ATORVASTATIN 40 MG TABLET: 40 | 90 days supply | Qty: 90 | Fill #0

## 2017-03-05 ENCOUNTER — Other Ambulatory Visit: Payer: Self-pay | Admitting: Internal Medicine

## 2017-03-05 DIAGNOSIS — J301 Allergic rhinitis due to pollen: Secondary | ICD-10-CM

## 2017-03-06 MED FILL — FLUTICASONE PROP 50 MCG SPR: 50 | 90 days supply | Qty: 48 | Fill #0

## 2017-03-19 ENCOUNTER — Other Ambulatory Visit (INDEPENDENT_AMBULATORY_CARE_PROVIDER_SITE_OTHER): Payer: No Typology Code available for payment source

## 2017-03-19 ENCOUNTER — Ambulatory Visit (INDEPENDENT_AMBULATORY_CARE_PROVIDER_SITE_OTHER): Payer: No Typology Code available for payment source | Admitting: Internal Medicine

## 2017-03-19 ENCOUNTER — Encounter: Payer: Self-pay | Admitting: Internal Medicine

## 2017-03-19 VITALS — BP 128/70 | HR 66 | Temp 98.4°F | Resp 16 | Ht 71.0 in | Wt 168.5 lb

## 2017-03-19 DIAGNOSIS — E785 Hyperlipidemia, unspecified: Secondary | ICD-10-CM

## 2017-03-19 DIAGNOSIS — C61 Malignant neoplasm of prostate: Secondary | ICD-10-CM

## 2017-03-19 DIAGNOSIS — Z0000 Encounter for general adult medical examination without abnormal findings: Secondary | ICD-10-CM | POA: Diagnosis not present

## 2017-03-19 DIAGNOSIS — S76311S Strain of muscle, fascia and tendon of the posterior muscle group at thigh level, right thigh, sequela: Secondary | ICD-10-CM

## 2017-03-19 DIAGNOSIS — IMO0001 Reserved for inherently not codable concepts without codable children: Secondary | ICD-10-CM | POA: Insufficient documentation

## 2017-03-19 DIAGNOSIS — S76399A Other specified injury of muscle, fascia and tendon of the posterior muscle group at thigh level, unspecified thigh, initial encounter: Secondary | ICD-10-CM

## 2017-03-19 LAB — COMPREHENSIVE METABOLIC PANEL
ALT: 26 U/L (ref 0–53)
AST: 22 U/L (ref 0–37)
Albumin: 3.9 g/dL (ref 3.5–5.2)
Alkaline Phosphatase: 56 U/L (ref 39–117)
BILIRUBIN TOTAL: 0.4 mg/dL (ref 0.2–1.2)
BUN: 15 mg/dL (ref 6–23)
CALCIUM: 9.4 mg/dL (ref 8.4–10.5)
CHLORIDE: 106 meq/L (ref 96–112)
CO2: 30 meq/L (ref 19–32)
CREATININE: 0.94 mg/dL (ref 0.40–1.50)
GFR: 85.08 mL/min (ref >60.00)
GLUCOSE: 82 mg/dL (ref 70–99)
Potassium: 3.5 mEq/L (ref 3.5–5.1)
SODIUM: 143 meq/L (ref 135–145)
Total Protein: 6.4 g/dL (ref 6.0–8.3)

## 2017-03-19 LAB — CBC WITH DIFFERENTIAL/PLATELET
BASOS ABS: 0.1 10*3/uL (ref 0.0–0.1)
Basophils Relative: 0.8 % (ref 0.0–3.0)
EOS ABS: 0.1 10*3/uL (ref 0.0–0.7)
Eosinophils Relative: 1.2 % (ref 0.0–5.0)
HEMATOCRIT: 40.2 % (ref 39.0–52.0)
Hemoglobin: 13.7 g/dL (ref 13.0–17.0)
LYMPHS PCT: 17.3 % (ref 12.0–46.0)
Lymphs Abs: 1.1 10*3/uL (ref 0.7–4.0)
MCHC: 34.1 g/dL (ref 30.0–36.0)
MCV: 94.9 fl (ref 78.0–100.0)
MONO ABS: 1 10*3/uL (ref 0.1–1.0)
Monocytes Relative: 16.2 % — ABNORMAL HIGH (ref 3.0–12.0)
NEUTROS ABS: 4.2 10*3/uL (ref 1.4–7.7)
Neutrophils Relative %: 64.5 % (ref 43.0–77.0)
PLATELETS: 295 10*3/uL (ref 150.0–400.0)
RBC: 4.23 Mil/uL (ref 4.22–5.81)
RDW: 14 % (ref 11.5–15.5)
WBC: 6.5 10*3/uL (ref 4.0–10.5)

## 2017-03-19 LAB — LIPID PANEL
CHOL/HDL RATIO: 3
Cholesterol: 107 mg/dL (ref 0–200)
HDL: 42 mg/dL (ref >39.00)
LDL CALC: 49 mg/dL (ref 0–99)
NonHDL: 65
TRIGLYCERIDES: 82 mg/dL (ref 0.0–149.0)
VLDL: 16.4 mg/dL (ref 0.0–40.0)

## 2017-03-19 LAB — PSA: PSA: 0 ng/mL — ABNORMAL LOW (ref 0.10–4.00)

## 2017-03-19 LAB — TSH: TSH: 1.56 u[IU]/mL (ref 0.35–4.50)

## 2017-03-19 NOTE — Patient Instructions (Signed)

## 2017-03-23 ENCOUNTER — Encounter: Payer: Self-pay | Admitting: Internal Medicine

## 2017-03-23 NOTE — Assessment & Plan Note (Signed)
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
## 2017-04-22 ENCOUNTER — Encounter: Payer: Self-pay | Admitting: Family Medicine

## 2017-04-22 ENCOUNTER — Ambulatory Visit (INDEPENDENT_AMBULATORY_CARE_PROVIDER_SITE_OTHER): Payer: No Typology Code available for payment source | Admitting: Family Medicine

## 2017-04-22 DIAGNOSIS — S76391D Other specified injury of muscle, fascia and tendon of the posterior muscle group at thigh level, right thigh, subsequent encounter: Secondary | ICD-10-CM | POA: Diagnosis not present

## 2017-04-22 DIAGNOSIS — IMO0001 Reserved for inherently not codable concepts without codable children: Secondary | ICD-10-CM

## 2017-04-22 NOTE — Patient Instructions (Signed)
Please try the exercises for the next 2 weeks  Then you can try the Nordic exercises after that  Please follow up with me in 4 weeks if there is no improvement.
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
## 2017-04-22 NOTE — Assessment & Plan Note (Signed)
Occurring in the midbelly of likely the biceps femoris. Pain only occurs after ramping up mileage.  It's chronic on this point. Nothing structurally changed on [place]. Possible for hamstring syndrome given his initial injury was in October. Has pain that radiates proximally. Less likely for proximal hamstring syndrome.  - counseled on HEP. Can try Nordic after three initial exercises  - counseled on compression  - discussed nitro patches but deferred  - if no improvement consider PT, injection vs MRI

## 2017-04-28 MED FILL — EZETIMIBE 10 MG TABLET: 10 | 90 days supply | Qty: 45 | Fill #2

## 2017-05-19 MED FILL — ATORVASTATIN 40 MG TABLET: 40 | 90 days supply | Qty: 90 | Fill #1

## 2017-06-26 ENCOUNTER — Encounter: Payer: Self-pay | Admitting: Internal Medicine

## 2017-06-26 DIAGNOSIS — J301 Allergic rhinitis due to pollen: Secondary | ICD-10-CM

## 2017-06-26 DIAGNOSIS — J45990 Exercise induced bronchospasm: Secondary | ICD-10-CM

## 2017-06-26 DIAGNOSIS — C44311 Basal cell carcinoma of skin of nose: Secondary | ICD-10-CM | POA: Diagnosis not present

## 2017-06-26 MED ORDER — MONTELUKAST SODIUM 10 MG PO TABS: 10.0000 mg | ORAL_TABLET | Freq: Every day | ORAL | 0 refills | Status: DC

## 2017-06-26 MED FILL — MONTELUKAST SOD 10 MG TAB: 10 | 90 days supply | Qty: 90 | Fill #0

## 2017-07-07 DIAGNOSIS — H40013 Open angle with borderline findings, low risk, bilateral: Secondary | ICD-10-CM | POA: Diagnosis not present

## 2017-07-28 MED FILL — EZETIMIBE 10 MG TABLET: 10 | 90 days supply | Qty: 45 | Fill #3

## 2017-08-05 DIAGNOSIS — Z85828 Personal history of other malignant neoplasm of skin: Secondary | ICD-10-CM | POA: Diagnosis not present

## 2017-08-05 DIAGNOSIS — C44311 Basal cell carcinoma of skin of nose: Secondary | ICD-10-CM | POA: Diagnosis not present

## 2017-08-05 MED FILL — DOXYCYCLINE HYCLATE 100 MG: 100 | 5 days supply | Qty: 10 | Fill #0

## 2017-09-08 ENCOUNTER — Other Ambulatory Visit: Payer: Self-pay | Admitting: Internal Medicine

## 2017-09-08 DIAGNOSIS — E785 Hyperlipidemia, unspecified: Secondary | ICD-10-CM

## 2017-09-08 MED FILL — ATORVASTATIN 40 MG TABLET: 40 | 90 days supply | Qty: 90 | Fill #0

## 2017-09-29 ENCOUNTER — Ambulatory Visit: Payer: No Typology Code available for payment source | Admitting: Internal Medicine

## 2017-09-30 ENCOUNTER — Ambulatory Visit: Payer: No Typology Code available for payment source | Admitting: Internal Medicine

## 2017-10-02 MED FILL — FLUTICASONE PROP 50 MCG SPR: 50 | 90 days supply | Qty: 48 | Fill #1

## 2017-10-06 ENCOUNTER — Encounter: Payer: Self-pay | Admitting: Internal Medicine

## 2017-10-20 ENCOUNTER — Other Ambulatory Visit: Payer: Self-pay | Admitting: Internal Medicine

## 2017-10-20 DIAGNOSIS — J45990 Exercise induced bronchospasm: Secondary | ICD-10-CM

## 2017-10-20 DIAGNOSIS — J301 Allergic rhinitis due to pollen: Secondary | ICD-10-CM

## 2017-10-20 MED FILL — ATORVASTATIN 40 MG TABLET: 40 | 30 days supply | Qty: 30 | Fill #1

## 2017-10-20 MED FILL — MONTELUKAST SOD 10 MG TAB: 10 | 90 days supply | Qty: 90 | Fill #0

## 2017-10-20 MED FILL — EZETIMIBE 10 MG TABLET: 10 | 30 days supply | Qty: 15 | Fill #0

## 2017-10-24 ENCOUNTER — Encounter: Payer: Self-pay | Admitting: Internal Medicine

## 2017-10-24 ENCOUNTER — Ambulatory Visit (INDEPENDENT_AMBULATORY_CARE_PROVIDER_SITE_OTHER): Payer: No Typology Code available for payment source | Admitting: Internal Medicine

## 2017-10-24 VITALS — BP 148/66 | HR 64 | Ht 71.0 in | Wt 166.8 lb

## 2017-10-24 DIAGNOSIS — I2510 Atherosclerotic heart disease of native coronary artery without angina pectoris: Secondary | ICD-10-CM

## 2017-10-24 DIAGNOSIS — I10 Essential (primary) hypertension: Secondary | ICD-10-CM | POA: Diagnosis not present

## 2017-10-24 DIAGNOSIS — E785 Hyperlipidemia, unspecified: Secondary | ICD-10-CM

## 2017-10-24 MED ORDER — EZETIMIBE 10 MG PO TABS: 5.0000 mg | ORAL_TABLET | Freq: Every day | ORAL | 3 refills | Status: DC

## 2017-10-24 NOTE — Progress Notes (Signed)
Cardiology Office Note   Date:  10/24/2017   ID:  Dustin Burton, DOB Jun 30, 1950, MRN [id number]  PCP:  Dustin Lima, MD  Cardiologist:   Dustin Carnes, MD   F/U of CAD  History of Present Illness: Dustin Burton is a 67 y.o. male with a history of CAD (as seen by coronary calcification on CT  )   SInce I saw him in clnic last he has continued to do very well   He remains extremely active biking    Recently got back from [place]  Denies CP   Breathing is good   No dizziness Stopped aspririn this summer   Outpatient Medications Prior to Visit  Medication Sig Dispense Refill  . albuterol (PROAIR HFA) 108 (90 BASE) MCG/ACT inhaler 2 puffs 3o minutes before strenuous exercise, every 4 to 6 hours as needed for shortness of breath. 2 Inhaler 5  . atorvastatin (LIPITOR) 40 MG tablet TAKE 1 TABLET BY MOUTH DAILY. 90 tablet 1  . ezetimibe (ZETIA) 10 MG tablet Take 0.5 tablets (5 mg total) by mouth daily. Please make overdue yearly appt with Dr. Harrington Burton before anymore refills. 1st attempt 15 tablet 0  . fluticasone (FLONASE) 50 MCG/ACT nasal spray PLACE 2 SPRAYS INTO BOTH NOSTRILS DAILY. 48 g 3  . montelukast (SINGULAIR) 10 MG tablet Take 1 tablet (10 mg total) by mouth at bedtime. 90 tablet 0  . Probiotic Product (PROBIOTIC DAILY PO) Take 1 capsule by mouth daily.     No facility-administered medications prior to visit.      Allergies:   Tadalafil   Past Medical History:  Diagnosis Date  . Brachial plexitis   . Elevated LFTs 07/2001  . Hemorrhoids   . Hypercholesteremia   . IBS (irritable bowel syndrome)   . Prostate cancer ([place])   . Seasonal allergies     Past Surgical History:  Procedure Laterality Date  . HAND SURGERY Left    index finger  . KNEE SURGERY Right    right  . PROSTATECTOMY       Social History:  The patient  reports that he has never smoked. He has never used smokeless tobacco. He reports that he drinks alcohol. He reports that he does not use drugs.   Family  History:  The patient's family history includes Alzheimer's disease in his father; Colon cancer in his father; Coronary artery disease in his mother; Crohn's disease in his father and mother; Cystic fibrosis in his other; Hyperlipidemia in his father and mother; Hypertension in his father and mother.    ROS:  Please see the history of present illness. All other systems are reviewed and  Negative to the above problem except as noted.    PHYSICAL EXAM: VS:  BP (!) 148/66   Pulse 64   Ht 5\' 11"  (1.803 m)   Wt 166 lb 12.8 oz (75.7 kg)   BMI 23.26 kg/m   GEN: Well nourished, well developed, in no acute distress  HEENT: normal  Neck: no JVD, carotid bruits, or masses Cardiac: RRR; no murmurs, rubs, or gallops,no edema  Respiratory:  clear to auscultation bilaterally, normal work of breathing GI: soft, nontender, nondistended, + BS  No hepatomegaly  MS: no deformity Moving all extremities   Skin: warm and dry, no rash Neuro:  Strength and sensation are intact Psych: euthymic mood, full affect   EKG:  EKG is ordered today.  SR 65  With occasional PVC  LVH    Lipid Panel  Component Value Date/Time   CHOL 107 03/19/2017 1547   TRIG 82.0 03/19/2017 1547   HDL 42.00 03/19/2017 1547   CHOLHDL 3 03/19/2017 1547   VLDL 16.4 03/19/2017 1547   LDLCALC 49 03/19/2017 1547   LDLDIRECT 112 (H) 08/02/2011 1228      Wt Readings from Last 3 Encounters:  10/24/17 166 lb 12.8 oz (75.7 kg)  04/22/17 166 lb (75.3 kg)  03/19/17 168 lb 8 oz (76.4 kg)     ASSESSMENT AND PLAN:  1  CAD COntinue to follow   Remains asymptomatic     2  HTN  BP is up   I recomm he follow bp at home   Bring cuff in to clinic in a few weeks along with log to confirm    2.  HL  Excellent control   Last LDL 7 months ago was 49   HDL 42   Will reflect before making changes    F/U based on BP  Signed, Dustin Carnes, MD  10/24/2017 3:15 PM    [facility] [address], [place], [place]   [address] Phone: [phone]; Fax: [phone]

## 2017-10-24 NOTE — Patient Instructions (Signed)
Medication Instructions:  Your physician recommends that you continue on your current medications as directed. Please refer to the Current Medication list given to you today.   Labwork: None ordered  Testing/Procedures: None ordered  Follow-Up: Your physician recommends that you come in for a nurse visit on October 28th at 11:00 AM for a Nurse Visit Blood Pressure Check.   Any Other Special Instructions Will Be Listed Below (If Applicable).  Keep log of blood pressure for a few weeks     Bring cuff and log with you to that visit.     If you need a refill on your cardiac medications before your next appointment, please call your pharmacy.

## 2017-11-17 ENCOUNTER — Ambulatory Visit: Payer: No Typology Code available for payment source

## 2017-11-17 MED FILL — ATORVASTATIN 40 MG TABLET: 40 | 30 days supply | Qty: 30 | Fill #2

## 2017-11-17 MED FILL — EZETIMIBE 10 MG TABLET: 10 | 90 days supply | Qty: 45 | Fill #0

## 2017-12-01 ENCOUNTER — Ambulatory Visit (INDEPENDENT_AMBULATORY_CARE_PROVIDER_SITE_OTHER): Payer: No Typology Code available for payment source | Admitting: *Deleted

## 2017-12-01 DIAGNOSIS — I10 Essential (primary) hypertension: Secondary | ICD-10-CM | POA: Diagnosis not present

## 2017-12-01 NOTE — Progress Notes (Signed)
Manual b/p 160/90 and pt's monitor 157/94. Discussed with Dr Harrington Challenger and Dr Harrington Challenger spoke with pt and will call pt later to start new med for B/p ./cy

## 2017-12-03 ENCOUNTER — Telehealth: Payer: Self-pay | Admitting: Internal Medicine

## 2017-12-03 MED ORDER — AMLODIPINE BESYLATE 2.5 MG PO TABS: 2.5000 mg | ORAL_TABLET | Freq: Every day | ORAL | 3 refills | Status: DC

## 2017-12-03 MED FILL — AMLODIPINE 2.5 MG TABLET: 2.5 | 90 days supply | Qty: 90 | Fill #0

## 2017-12-03 NOTE — Telephone Encounter (Signed)
Spoke with patient and informed medication will be sent in.  He will take once daily and then write in MyChart in few weeks some BP readings.

## 2017-12-03 NOTE — Addendum Note (Signed)
Addended by: Rodman Key on: 12/03/2017 04:27 PM   Modules accepted: Orders
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
## 2017-12-16 DIAGNOSIS — L578 Other skin changes due to chronic exposure to nonionizing radiation: Secondary | ICD-10-CM | POA: Diagnosis not present

## 2017-12-16 DIAGNOSIS — D225 Melanocytic nevi of trunk: Secondary | ICD-10-CM | POA: Diagnosis not present

## 2017-12-16 DIAGNOSIS — L821 Other seborrheic keratosis: Secondary | ICD-10-CM | POA: Diagnosis not present

## 2017-12-16 DIAGNOSIS — D2261 Melanocytic nevi of right upper limb, including shoulder: Secondary | ICD-10-CM | POA: Diagnosis not present

## 2017-12-16 DIAGNOSIS — D1801 Hemangioma of skin and subcutaneous tissue: Secondary | ICD-10-CM | POA: Diagnosis not present

## 2017-12-16 DIAGNOSIS — Z85828 Personal history of other malignant neoplasm of skin: Secondary | ICD-10-CM | POA: Diagnosis not present

## 2017-12-16 DIAGNOSIS — L218 Other seborrheic dermatitis: Secondary | ICD-10-CM | POA: Diagnosis not present

## 2018-01-19 MED FILL — ATORVASTATIN 40 MG TABLET: 40 | 30 days supply | Qty: 30 | Fill #3

## 2018-02-09 DIAGNOSIS — H353112 Nonexudative age-related macular degeneration, right eye, intermediate dry stage: Secondary | ICD-10-CM | POA: Diagnosis not present

## 2018-02-09 DIAGNOSIS — H353121 Nonexudative age-related macular degeneration, left eye, early dry stage: Secondary | ICD-10-CM | POA: Diagnosis not present

## 2018-03-02 ENCOUNTER — Other Ambulatory Visit: Payer: Self-pay | Admitting: Internal Medicine

## 2018-03-02 DIAGNOSIS — E785 Hyperlipidemia, unspecified: Secondary | ICD-10-CM

## 2018-03-02 MED FILL — ATORVASTATIN 40 MG TABLET: 40 | 30 days supply | Qty: 30 | Fill #0

## 2018-03-17 ENCOUNTER — Encounter: Payer: No Typology Code available for payment source | Admitting: Internal Medicine

## 2018-03-29 ENCOUNTER — Encounter: Payer: Self-pay | Admitting: Internal Medicine

## 2018-03-30 ENCOUNTER — Encounter: Payer: Self-pay | Admitting: Nurse Practitioner

## 2018-03-30 ENCOUNTER — Ambulatory Visit (INDEPENDENT_AMBULATORY_CARE_PROVIDER_SITE_OTHER): Payer: PPO | Admitting: Nurse Practitioner

## 2018-03-30 ENCOUNTER — Other Ambulatory Visit: Payer: Self-pay

## 2018-03-30 ENCOUNTER — Encounter: Payer: Self-pay | Admitting: Internal Medicine

## 2018-03-30 VITALS — BP 174/82 | HR 106 | Temp 101.3°F | Ht 71.0 in | Wt 165.0 lb

## 2018-03-30 DIAGNOSIS — R6889 Other general symptoms and signs: Secondary | ICD-10-CM

## 2018-03-30 LAB — POC INFLUENZA A&B (BINAX/QUICKVUE)
INFLUENZA A, POC: POSITIVE — AB
Influenza B, POC: NEGATIVE

## 2018-03-30 MED ORDER — OSELTAMIVIR PHOSPHATE 75 MG PO CAPS: 75.0000 mg | ORAL_CAPSULE | Freq: Two times a day (BID) | ORAL | 0 refills | Status: DC

## 2018-03-30 MED FILL — OSELTAMIVIR PHOSPHATE 75 MG: 75 | 5 days supply | Qty: 10 | Fill #0

## 2018-03-30 NOTE — Progress Notes (Deleted)
Subjective:   Dustin Burton is a 68 y.o. male who presents for Medicare Annual/Subsequent preventive examination.  Review of Systems:  No ROS.  Medicare Wellness Visit. Additional risk factors are reflected in the social history.    Sleep patterns: {SX; SLEEP PATTERNS:18802::"feels rested on waking","does not get up to void","gets up *** times nightly to void","sleeps *** hours nightly"}.    Home Safety/Smoke Alarms: Feels safe in home. Smoke alarms in place.  Living environment; residence and Firearm Safety: {Rehab home environment / accessibility:30080::"no firearms","firearms stored safely"}. Seat Belt Safety/Bike Helmet: Wears seat belt.   PSA-  Lab Results  Component Value Date   PSA 0.00 (L) 03/19/2017   PSA 0.00 (L) 02/12/2016   PSA 0.00 (L) 02/09/2015       Objective:    Vitals: There were no vitals taken for this visit.  There is no height or weight on file to calculate BMI.  Advanced Directives 02/18/2016 04/29/2014 01/06/2014 11/04/2013 07/13/2013 07/12/2013  Does Patient Have a Medical Advance Directive? Yes Yes No No Patient does not have advance directive;Patient would not like information Patient does not have advance directive  Type of Advance Directive Wall;Living will Living will - - - -  Copy of [place] in Chart? Yes No - copy requested - - - -  Would patient like information on creating a medical advance directive? - - Yes - Educational materials given No - patient declined information - -    Tobacco Social History   Tobacco Use  Smoking Status Never Smoker  Smokeless Tobacco Never Used     Counseling given: Not Answered  Past Medical History:  Diagnosis Date  . Brachial plexitis   . Elevated LFTs 07/2001  . Hemorrhoids   . Hypercholesteremia   . IBS (irritable bowel syndrome)   . Prostate cancer ([place])   . Seasonal allergies    Past Surgical History:  Procedure Laterality Date  . HAND SURGERY Left    index finger  . KNEE SURGERY Right    right  . PROSTATECTOMY     Family History  Problem Relation Age of Onset  . Hypertension Mother   . Coronary artery disease Mother        PCI  . Crohn's disease Mother   . Hyperlipidemia Mother   . Hypertension Father        carotid endarterectomy  . Colon cancer Father   . Crohn's disease Father   . Hyperlipidemia Father   . Alzheimer's disease Father   . Cystic fibrosis Other        niece   Social History   Socioeconomic History  . Marital status: Married    Spouse name: Not on file  . Number of children: Not on file  . Years of education: Not on file  . Highest education level: Not on file  Occupational History  . Occupation: [employer]    Employer: RETIRED    Comment: Retired  Scientific laboratory technician  . Financial resource strain: Not on file  . Food insecurity:    Worry: Not on file    Inability: Not on file  . Transportation needs:    Medical: Not on file    Non-medical: Not on file  Tobacco Use  . Smoking status: Never Smoker  . Smokeless tobacco: Never Used  Substance and Sexual Activity  . Alcohol use: Yes    Comment: socially  . Drug use: No  . Sexual activity:  Not on file  Lifestyle  . Physical activity:    Days per week: Not on file    Minutes per session: Not on file  . Stress: Not on file  Relationships  . Social connections:    Talks on phone: Not on file    Gets together: Not on file    Attends religious service: Not on file    Active member of club or organization: Not on file    Attends meetings of clubs or organizations: Not on file    Relationship status: Not on file  Other Topics Concern  . Not on file  Social History Narrative  . Not on file    Outpatient Encounter Medications as of 03/31/2018  Medication Sig  . albuterol (PROAIR HFA) 108 (90 BASE) MCG/ACT inhaler 2 puffs 3o minutes before strenuous exercise, every 4 to 6 hours as needed for shortness of breath.  [name]  atorvastatin (LIPITOR) 40 MG tablet TAKE 1 TABLET BY MOUTH DAILY.  [name] ezetimibe (ZETIA) 10 MG tablet Take 0.5 tablets (5 mg total) by mouth daily.  . fluticasone (FLONASE) 50 MCG/ACT nasal spray PLACE 2 SPRAYS INTO BOTH NOSTRILS DAILY.  . montelukast (SINGULAIR) 10 MG tablet Take 1 tablet (10 mg total) by mouth at bedtime.  [name] oseltamivir (TAMIFLU) 75 MG capsule Take 1 capsule (75 mg total) by mouth 2 (two) times daily.  . Probiotic Product (PROBIOTIC DAILY PO) Take 1 capsule by mouth daily.  . [DISCONTINUED] amLODipine (NORVASC) 2.5 MG tablet Take 1 tablet (2.5 mg total) by mouth daily.   No facility-administered encounter medications on file as of 03/31/2018.     Activities of Daily Living No flowsheet data found.  Patient Care Team: Janith Lima, MD as PCP - General (Internal Medicine) Fay Records, MD as PCP - Cardiology (Cardiology)   Assessment:   This is a routine wellness examination for [place]. Physical assessment deferred to PCP.  Exercise Activities and Dietary recommendations   Diet (meal preparation, eat out, water intake, caffeinated beverages, dairy products, fruits and vegetables): {Desc; diets:16563}      Goals   None     Fall Risk Fall Risk  03/23/2017 03/19/2017 02/18/2016 08/27/2012  Falls in the past year? No No No No    Depression Screen PHQ 2/9 Scores 03/23/2017 03/19/2017 02/18/2016 04/29/2014  PHQ - 2 Score 0 1 0 0    Cognitive Function        Immunization History  Administered Date(s) Administered  . Influenza Split 05/29/2006, 11/05/2010, 11/12/2011  . Influenza Whole 11/12/2006, 10/14/2007, 11/29/2008, 11/02/2009  . Influenza, High Dose Seasonal PF 11/11/2015, 10/31/2016, 10/02/2017  . Influenza,inj,Quad PF,6+ Mos 10/06/2017  . Influenza-Unspecified 09/22/2014  . PPD Test 01/26/2013  . Pneumococcal Conjugate-13 02/09/2015  . Pneumococcal Polysaccharide-23 09/03/2013  . Td 10/22/2002  . Tdap 09/03/2013  . Zoster 08/28/2012  . Zoster Recombinat  (Shingrix) 07/18/2016, 09/24/2016   Screening Tests Health Maintenance  Topic Date Due  . PNA vac Low Risk Adult (2 of 2 - PPSV23) 09/04/2018  . COLONOSCOPY  08/15/2020  . TETANUS/TDAP  09/04/2023  . INFLUENZA VACCINE  Completed  . Hepatitis C Screening  Completed       Plan:     I have personally reviewed and noted the following in the patient's chart:   . Medical and social history . Use of alcohol, tobacco or illicit drugs  . Current medications and supplements . Functional ability and status . Nutritional status . Physical activity . Advanced directives .  List of other physicians . Vitals . Screenings to include cognitive, depression, and falls . Referrals and appointments  In addition, I have reviewed and discussed with patient certain preventive protocols, quality metrics, and best practice recommendations. A written personalized care plan for preventive services as well as general preventive health recommendations were provided to patient.     Michiel Cowboy, RN  03/30/2018

## 2018-03-30 NOTE — Progress Notes (Signed)
Dustin Burton is a 68 y.o. male with the following history as recorded in [employer]:  Patient Active Problem List   Diagnosis Date Noted  . Hypertension 12/01/2017  . Hamstring sprain 03/19/2017  . Routine general medical examination at a health care facility 02/12/2016  . Chronic seasonal allergic rhinitis due to pollen 02/12/2016  . Bronchiectasis ([place]) 05/28/2013  . Polyneuropathy 02/17/2013  . Positive ANA (antinuclear antibody) 01/28/2013  . Lymphocytic colitis 03/31/2012  . ADENOCARCINOMA, PROSTATE 11/23/2008  . Hyperlipidemia with target LDL less than 70 04/23/2007  . Exercise-induced bronchospasm 03/20/2006    Current Outpatient Medications  Medication Sig Dispense Refill  . albuterol (PROAIR HFA) 108 (90 BASE) MCG/ACT inhaler 2 puffs 3o minutes before strenuous exercise, every 4 to 6 hours as needed for shortness of breath. 2 Inhaler 5  . atorvastatin (LIPITOR) 40 MG tablet TAKE 1 TABLET BY MOUTH DAILY. 30 tablet 0  . ezetimibe (ZETIA) 10 MG tablet Take 0.5 tablets (5 mg total) by mouth daily. 90 tablet 3  . fluticasone (FLONASE) 50 MCG/ACT nasal spray PLACE 2 SPRAYS INTO BOTH NOSTRILS DAILY. 48 g 3  . montelukast (SINGULAIR) 10 MG tablet Take 1 tablet (10 mg total) by mouth at bedtime. 90 tablet 0  . Probiotic Product (PROBIOTIC DAILY PO) Take 1 capsule by mouth daily.    [name] oseltamivir (TAMIFLU) 75 MG capsule Take 1 capsule (75 mg total) by mouth 2 (two) times daily. 10 capsule 0   No current facility-administered medications for this visit.     Allergies: Tadalafil  Past Medical History:  Diagnosis Date  . Brachial plexitis   . Elevated LFTs 07/2001  . Hemorrhoids   . Hypercholesteremia   . IBS (irritable bowel syndrome)   . Prostate cancer ([place])   . Seasonal allergies     Past Surgical History:  Procedure Laterality Date  . HAND SURGERY Left    index finger  . KNEE SURGERY Right    right  . PROSTATECTOMY      Family History  Problem Relation Age of Onset  .  Hypertension Mother   . Coronary artery disease Mother        PCI  . Crohn's disease Mother   . Hyperlipidemia Mother   . Hypertension Father        carotid endarterectomy  . Colon cancer Father   . Crohn's disease Father   . Hyperlipidemia Father   . Alzheimer's disease Father   . Cystic fibrosis Other        niece    Social History   Tobacco Use  . Smoking status: Never Smoker  . Smokeless tobacco: Never Used  Substance Use Topics  . Alcohol use: Yes    Comment: socially     Subjective:  Dustin Burton is here today requesting evaluation of acute complaint of flu like illness, which first began yesterday morning, he woke with body aches, dry cough, low grade fevers, then today feeling even worse with fever of 102.5 when he checked at home this morning. He has used his spirometer at home, and tells me his lung volumes have been normal Denies: syncope, confusion, chest pain, shortness of breath, wheezing, abdominal pain, nausea,vomiting, diarrhea Smoker? no Tried at home: nyquil ,advil flu. He has not taken anything for his fevers Recent travel: [place] cruise, returned home 2 days ago  ROS- See HPI  Objective:  Vitals:   03/30/18 1319  BP: (!) 174/82  Pulse: (!) 106  Temp: (!) 101.3 F (38.5 C)  TempSrc: Oral  SpO2: 97%  Weight: 165 lb (74.8 kg)  Height: 5\' 11"  (1.803 m)    General: Well developed, well nourished, in no acute distress  Skin : Warm and dry.  Head: Normocephalic and atraumatic  Eyes: Sclera and conjunctiva clear; pupils round and reactive to light; extraocular movements intact  Oropharynx: Pink, supple. No suspicious lesions  Neck: Supple without thyromegaly Lungs: Respirations unlabored; clear to auscultation bilaterally without wheeze, rales, rhonchi  CVS exam: normal rate and regular rhythm, S1 and S2 normal.  Extremities: No edema, cyanosis, clubbing  Vessels: Symmetric bilaterally  Neurologic: Alert and oriented; speech intact; face  symmetrical; moves all extremities well; CNII-XII intact without focal deficit  Psychiatric: Normal mood and affect.   Assessment:  1. Flu-like symptoms     Plan:   POCT flu A POSITIVE tamiflu course sent- medication dosing, side effects discussed Home management, fever reduction, red flags and return precautions including when to seek immediate/emergency care discussed and printed on AVS He will f/u for new, worsening symptoms of if symptoms are not better in 1 week  No follow-ups on file.  Orders Placed This Encounter  Procedures  . POC Influenza A&B (Binax test)    Requested Prescriptions   Signed Prescriptions Disp Refills  . oseltamivir (TAMIFLU) 75 MG capsule 10 capsule 0    Sig: Take 1 capsule (75 mg total) by mouth 2 (two) times daily.

## 2018-03-30 NOTE — Patient Instructions (Addendum)
You have the flu  Please take tamiflu as prescribed  Saline nasal spray used frequently. For drainage may use Allegra, Claritin or Zyrtec. If you need stronger medicine to stop drainage may take Chlor-Trimeton 2-4 mg every 4 hours. This may cause drowsiness. Ibuprofen or tylenol as directed for pain, discomfort or fever. Drink plenty of fluids and stay well-hydrated.  Please follow up for fevers over 101, if your symptoms get worse, or if your symptoms dont get better in 1 week.  Influenza, Adult Influenza is also called "the flu." It is an infection in the lungs, nose, and throat (respiratory tract). It is caused by a virus. The flu causes symptoms that are similar to symptoms of a cold. It also causes a high fever and body aches. The flu spreads easily from person to person (is contagious). Getting a flu shot (influenza vaccination) every year is the best way to prevent the flu. What are the causes? This condition is caused by the influenza virus. You can get the virus by:  Breathing in droplets that are in the air from the cough or sneeze of a person who has the virus.  Touching something that has the virus on it (is contaminated) and then touching your mouth, nose, or eyes. What increases the risk? Certain things may make you more likely to get the flu. These include:  Not washing your hands often.  Having close contact with many people during cold and flu season.  Touching your mouth, eyes, or nose without first washing your hands.  Not getting a flu shot every year. You may have a higher risk for the flu, along with serious problems such as a lung infection (pneumonia), if you:  Are older than 65.  Are pregnant.  Have a weakened disease-fighting system (immune system) because of a disease or taking certain medicines.  Have a long-term (chronic) illness, such as: ? Heart, kidney, or lung disease. ? Diabetes. ? Asthma.  Have a liver disorder.  Are very overweight  (morbidly obese).  Have anemia. This is a condition that affects your red blood cells. What are the signs or symptoms? Symptoms usually begin suddenly and last 4-14 days. They may include:  Fever and chills.  Headaches, body aches, or muscle aches.  Sore throat.  Cough.  Runny or stuffy (congested) nose.  Chest discomfort.  Not wanting to eat as much as normal (poor appetite).  Weakness or feeling tired (fatigue).  Dizziness.  Feeling sick to your stomach (nauseous) or throwing up (vomiting). How is this treated? If the flu is found early, you can be treated with medicine that can help reduce how bad the illness is and how long it lasts (antiviral medicine). This may be given by mouth (orally) or through an IV tube. Taking care of yourself at home can help your symptoms get better. Your doctor may suggest:  Taking over-the-counter medicines.  Drinking plenty of fluids. The flu often goes away on its own. If you have very bad symptoms or other problems, you may be treated in a hospital. Follow these instructions at home:     Activity  Rest as needed. Get plenty of sleep.  Stay home from work or school as told by your doctor. ? Do not leave home until you do not have a fever for 24 hours without taking medicine. ? Leave home only to visit your doctor. Eating and drinking  Take an ORS (oral rehydration solution). This is a drink that is sold at pharmacies and  stores.  Drink enough fluid to keep your pee (urine) pale yellow.  Drink clear fluids in small amounts as you are able. Clear fluids include: ? Water. ? Ice chips. ? Fruit juice that has water added (diluted fruit juice). ? Low-calorie sports drinks.  Eat bland, easy-to-digest foods in small amounts as you are able. These foods include: ? Bananas. ? Applesauce. ? Rice. ? Lean meats. ? Toast. ? Crackers.  Do not eat or drink: ? Fluids that have a lot of sugar or caffeine. ? Alcohol. ? Spicy or fatty  foods. General instructions  Take over-the-counter and prescription medicines only as told by your doctor.  Use a cool mist humidifier to add moisture to the air in your home. This can make it easier for you to breathe.  Cover your mouth and nose when you cough or sneeze.  Wash your hands with soap and water often, especially after you cough or sneeze. If you cannot use soap and water, use alcohol-based hand sanitizer.  Keep all follow-up visits as told by your doctor. This is important. How is this prevented?   Get a flu shot every year. You may get the flu shot in late summer, fall, or winter. Ask your doctor when you should get your flu shot.  Avoid contact with people who are sick during fall and winter (cold and flu season). Contact a doctor if:  You get new symptoms.  You have: ? Chest pain. ? Watery poop (diarrhea). ? A fever.  Your cough gets worse.  You start to have more mucus.  You feel sick to your stomach.  You throw up. Get help right away if you:  Have shortness of breath.  Have trouble breathing.  Have skin or nails that turn a bluish color.  Have very bad pain or stiffness in your neck.  Get a sudden headache.  Get sudden pain in your face or ear.  Cannot eat or drink without throwing up. Summary  Influenza ("the flu") is an infection in the lungs, nose, and throat. It is caused by a virus.  Take over-the-counter and prescription medicines only as told by your doctor.  Getting a flu shot every year is the best way to avoid getting the flu. This information is not intended to replace advice given to you by your health care provider. Make sure you discuss any questions you have with your health care provider. Document Released: 10/17/2007 Document Revised: 06/25/2017 Document Reviewed: 06/25/2017 Elsevier Interactive Patient Education  2019 [employer].

## 2018-03-31 ENCOUNTER — Ambulatory Visit: Payer: No Typology Code available for payment source

## 2018-03-31 ENCOUNTER — Encounter: Payer: No Typology Code available for payment source | Admitting: Internal Medicine

## 2018-03-31 ENCOUNTER — Telehealth: Payer: Self-pay | Admitting: *Deleted

## 2018-03-31 NOTE — Telephone Encounter (Signed)
Called and LVM regarding rescheduling AWV and physical scheduled for today. Nurse sees where patient was seen yesterday and diagnosed with the flu. Nurse requested that the patient call nurse back or call PEC numbers to both were provided on VM.

## 2018-04-14 MED FILL — EZETIMIBE 10 MG TABS: 10 | 90 days supply | Qty: 45 | Fill #1

## 2018-04-17 ENCOUNTER — Other Ambulatory Visit: Payer: Self-pay | Admitting: Internal Medicine

## 2018-04-17 DIAGNOSIS — E785 Hyperlipidemia, unspecified: Secondary | ICD-10-CM

## 2018-04-20 MED ORDER — ATORVASTATIN CALCIUM 40 MG PO TABS: 40.0000 mg | ORAL_TABLET | Freq: Every day | ORAL | 0 refills | Status: DC

## 2018-04-20 MED FILL — ATORVASTATIN 40 MG TABLET: 40 | 90 days supply | Qty: 90 | Fill #0

## 2018-05-15 ENCOUNTER — Other Ambulatory Visit: Payer: Self-pay | Admitting: Internal Medicine

## 2018-05-15 DIAGNOSIS — J45990 Exercise induced bronchospasm: Secondary | ICD-10-CM

## 2018-05-15 DIAGNOSIS — J301 Allergic rhinitis due to pollen: Secondary | ICD-10-CM

## 2018-05-18 MED ORDER — MONTELUKAST SODIUM 10 MG PO TABS: 10.0000 mg | ORAL_TABLET | Freq: Every day | ORAL | 0 refills | Status: DC

## 2018-05-18 MED FILL — MONTELUKAST SOD 10 MG TAB: 10 | 90 days supply | Qty: 90 | Fill #0

## 2018-07-27 ENCOUNTER — Encounter (HOSPITAL_BASED_OUTPATIENT_CLINIC_OR_DEPARTMENT_OTHER): Payer: Self-pay | Admitting: *Deleted

## 2018-07-27 ENCOUNTER — Emergency Department (HOSPITAL_BASED_OUTPATIENT_CLINIC_OR_DEPARTMENT_OTHER)
Admission: EM | Admit: 2018-07-27 | Discharge: 2018-07-27 | Disposition: A | Discharge status: Home / Self Care | Payer: PPO | Attending: Emergency Medicine | Admitting: Emergency Medicine

## 2018-07-27 ENCOUNTER — Emergency Department (HOSPITAL_BASED_OUTPATIENT_CLINIC_OR_DEPARTMENT_OTHER): Payer: PPO

## 2018-07-27 ENCOUNTER — Other Ambulatory Visit: Payer: Self-pay

## 2018-07-27 DIAGNOSIS — X18XXXA Contact with other hot metals, initial encounter: Secondary | ICD-10-CM | POA: Insufficient documentation

## 2018-07-27 DIAGNOSIS — Y999 Unspecified external cause status: Secondary | ICD-10-CM | POA: Diagnosis not present

## 2018-07-27 DIAGNOSIS — R000 Tachycardia, unspecified: Secondary | ICD-10-CM | POA: Diagnosis not present

## 2018-07-27 DIAGNOSIS — Z23 Encounter for immunization: Secondary | ICD-10-CM | POA: Diagnosis not present

## 2018-07-27 DIAGNOSIS — S01512A Laceration without foreign body of oral cavity, initial encounter: Secondary | ICD-10-CM | POA: Insufficient documentation

## 2018-07-27 DIAGNOSIS — Y9389 Activity, other specified: Secondary | ICD-10-CM | POA: Diagnosis not present

## 2018-07-27 DIAGNOSIS — S060X0A Concussion without loss of consciousness, initial encounter: Secondary | ICD-10-CM | POA: Diagnosis not present

## 2018-07-27 DIAGNOSIS — R51 Headache: Secondary | ICD-10-CM | POA: Diagnosis not present

## 2018-07-27 DIAGNOSIS — T23261A Burn of second degree of back of right hand, initial encounter: Secondary | ICD-10-CM | POA: Diagnosis not present

## 2018-07-27 DIAGNOSIS — Z79899 Other long term (current) drug therapy: Secondary | ICD-10-CM | POA: Diagnosis not present

## 2018-07-27 DIAGNOSIS — R52 Pain, unspecified: Secondary | ICD-10-CM | POA: Diagnosis not present

## 2018-07-27 DIAGNOSIS — I10 Essential (primary) hypertension: Secondary | ICD-10-CM | POA: Diagnosis not present

## 2018-07-27 DIAGNOSIS — S0993XA Unspecified injury of face, initial encounter: Secondary | ICD-10-CM | POA: Diagnosis not present

## 2018-07-27 DIAGNOSIS — Y92414 Local residential or business street as the place of occurrence of the external cause: Secondary | ICD-10-CM | POA: Insufficient documentation

## 2018-07-27 DIAGNOSIS — R609 Edema, unspecified: Secondary | ICD-10-CM | POA: Diagnosis not present

## 2018-07-27 DIAGNOSIS — T23061A Burn of unspecified degree of back of right hand, initial encounter: Secondary | ICD-10-CM

## 2018-07-27 DIAGNOSIS — S0990XA Unspecified injury of head, initial encounter: Secondary | ICD-10-CM | POA: Diagnosis not present

## 2018-07-27 MED ORDER — TETANUS-DIPHTH-ACELL PERTUSSIS 5-2.5-18.5 LF-MCG/0.5 IM SUSP
0.5000 mL | Freq: Once | INTRAMUSCULAR | Status: AC
  Administered 2018-07-27: 0.5 mL via INTRAMUSCULAR
  Filled 2018-07-27: qty 0.5
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
## 2018-07-27 NOTE — Discharge Instructions (Addendum)
You can take ibuprofen or tylenol as needed for headache and body aches.  Use Neosporin on the burn on your hand and change the bandage once a day.

## 2018-07-28 ENCOUNTER — Telehealth: Payer: Self-pay
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
## 2018-07-29 ENCOUNTER — Encounter: Payer: Self-pay | Admitting: Family Medicine

## 2018-07-29 ENCOUNTER — Other Ambulatory Visit: Payer: Self-pay

## 2018-07-29 ENCOUNTER — Ambulatory Visit (INDEPENDENT_AMBULATORY_CARE_PROVIDER_SITE_OTHER): Payer: PPO | Admitting: Family Medicine

## 2018-07-29 DIAGNOSIS — S060X0A Concussion without loss of consciousness, initial encounter: Secondary | ICD-10-CM | POA: Diagnosis not present

## 2018-07-29 DIAGNOSIS — S060XAA Concussion with loss of consciousness status unknown, initial encounter: Secondary | ICD-10-CM | POA: Insufficient documentation

## 2018-07-29 DIAGNOSIS — S060X9A Concussion with loss of consciousness of unspecified duration, initial encounter: Secondary | ICD-10-CM | POA: Insufficient documentation

## 2018-07-29 MED ORDER — TRAZODONE HCL 50 MG PO TABS: 25.0000 mg | ORAL_TABLET | Freq: Every evening | ORAL | 3 refills | Status: DC | PRN

## 2018-07-29 MED FILL — traZODone HCL 50 MG TABS: 50 | 30 days supply | Qty: 30 | Fill #0

## 2018-07-29 NOTE — Patient Instructions (Signed)
Good to see you 400mg  CoQ10 2 grams of Fish oil 2000 IU of Vitamin D Choline 500mg  No cycling until Monday See me again on Monday for a virtual visit

## 2018-07-29 NOTE — Assessment & Plan Note (Signed)
Concussion.  Has had repetitive head injuries over the course of the last 5 days.  Patient does have some very mild findings of concussion is having difficulty with sleep.  Discussed with patient in great length.  We discussed medications, icing regimen.  Which activities of doing which wants to avoid.  Patient will avoid cycling until we have further controlled movement again on Monday which hopefully will fully released at that point.
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
## 2018-08-03 ENCOUNTER — Other Ambulatory Visit: Payer: Self-pay | Admitting: Internal Medicine

## 2018-08-03 ENCOUNTER — Encounter: Payer: Self-pay | Admitting: Family Medicine

## 2018-08-03 ENCOUNTER — Ambulatory Visit (INDEPENDENT_AMBULATORY_CARE_PROVIDER_SITE_OTHER): Payer: PPO | Admitting: Family Medicine

## 2018-08-03 ENCOUNTER — Encounter: Payer: Self-pay | Admitting: Internal Medicine

## 2018-08-03 DIAGNOSIS — E785 Hyperlipidemia, unspecified: Secondary | ICD-10-CM

## 2018-08-03 DIAGNOSIS — S060X0D Concussion without loss of consciousness, subsequent encounter: Secondary | ICD-10-CM | POA: Diagnosis not present

## 2018-08-03 MED FILL — EZETIMIBE 10 MG TABS: 10 | 90 days supply | Qty: 45 | Fill #0

## 2018-08-05 ENCOUNTER — Ambulatory Visit (INDEPENDENT_AMBULATORY_CARE_PROVIDER_SITE_OTHER): Payer: PPO | Admitting: Internal Medicine

## 2018-08-05 DIAGNOSIS — E785 Hyperlipidemia, unspecified: Secondary | ICD-10-CM | POA: Diagnosis not present

## 2018-08-05 MED ORDER — ATORVASTATIN CALCIUM 40 MG PO TABS: 40.0000 mg | ORAL_TABLET | Freq: Every day | ORAL | 0 refills | Status: DC

## 2018-08-05 MED FILL — ATORVASTATIN 40 MG TABLET: 40 | 90 days supply | Qty: 90 | Fill #0

## 2018-08-06 ENCOUNTER — Encounter: Payer: Self-pay | Admitting: Internal Medicine
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
## 2018-08-24 ENCOUNTER — Other Ambulatory Visit: Payer: Self-pay

## 2018-10-05 ENCOUNTER — Encounter: Payer: Self-pay | Admitting: Internal Medicine

## 2018-10-05 ENCOUNTER — Telehealth: Payer: Self-pay | Admitting: Internal Medicine

## 2018-10-05 NOTE — Telephone Encounter (Signed)
Please advise 

## 2018-10-05 NOTE — Telephone Encounter (Signed)
Copied from [place] [phone]. Topic: Appointment Scheduling - Prior Auth Required for Appointment >> Oct 05, 2018 10:47 AM Dustin Burton J wrote: No appointment has been scheduled. Patient is requesting new pt appt with Dustin Burton appointment. Per scheduling protocol, this appointment requires a prior authorization prior to scheduling. Pt currently with Dustin Burton at [facility] [employer], would like to go to a location that is closer to his home.  Route to department's PEC pool. Cb is [phone]

## 2018-10-05 NOTE — Telephone Encounter (Signed)
[place] with me.   Algis Greenhouse. Jerline Pain, MD 10/05/2018 11:45 AM

## 2018-10-05 NOTE — Telephone Encounter (Signed)
Could someone please contact pt to schedule transfer? Thanks!

## 2018-10-05 NOTE — Telephone Encounter (Signed)
Yes, this is okay with me 

## 2018-10-26 ENCOUNTER — Ambulatory Visit (INDEPENDENT_AMBULATORY_CARE_PROVIDER_SITE_OTHER): Payer: PPO | Admitting: Family Medicine

## 2018-10-26 ENCOUNTER — Other Ambulatory Visit: Payer: Self-pay

## 2018-10-26 ENCOUNTER — Encounter: Payer: Self-pay | Admitting: Family Medicine

## 2018-10-26 VITALS — BP 152/88 | HR 55 | Temp 98.4°F | Ht 71.0 in | Wt 171.2 lb

## 2018-10-26 DIAGNOSIS — Z23 Encounter for immunization: Secondary | ICD-10-CM | POA: Diagnosis not present

## 2018-10-26 DIAGNOSIS — Z0001 Encounter for general adult medical examination with abnormal findings: Secondary | ICD-10-CM | POA: Diagnosis not present

## 2018-10-26 DIAGNOSIS — L989 Disorder of the skin and subcutaneous tissue, unspecified: Secondary | ICD-10-CM

## 2018-10-26 DIAGNOSIS — E785 Hyperlipidemia, unspecified: Secondary | ICD-10-CM

## 2018-10-26 DIAGNOSIS — J301 Allergic rhinitis due to pollen: Secondary | ICD-10-CM

## 2018-10-26 DIAGNOSIS — J45990 Exercise induced bronchospasm: Secondary | ICD-10-CM

## 2018-10-26 DIAGNOSIS — I10 Essential (primary) hypertension: Secondary | ICD-10-CM

## 2018-10-26 DIAGNOSIS — Z8546 Personal history of malignant neoplasm of prostate: Secondary | ICD-10-CM | POA: Diagnosis not present

## 2018-10-26 DIAGNOSIS — L570 Actinic keratosis: Secondary | ICD-10-CM | POA: Diagnosis not present

## 2018-10-26 MED ORDER — ATORVASTATIN CALCIUM 40 MG PO TABS: 40.0000 mg | ORAL_TABLET | Freq: Every day | ORAL | 3 refills | Status: DC

## 2018-10-26 MED ORDER — MONTELUKAST SODIUM 10 MG PO TABS: 10.0000 mg | ORAL_TABLET | Freq: Every day | ORAL | 0 refills | Status: DC

## 2018-10-26 MED FILL — MONTELUKAST SOD 10 MG TAB: 10 | 90 days supply | Qty: 90 | Fill #0

## 2018-10-26 MED FILL — ATORVASTATIN 40 MG TABLET: 40 | 90 days supply | Qty: 90 | Fill #0

## 2018-10-26 NOTE — Assessment & Plan Note (Signed)
Refilled Singulair.

## 2018-10-26 NOTE — Patient Instructions (Signed)
It was very nice to see you today!  We will biopsy the spot on your leg today.  We will give you your flu vaccine and check blood work today.   Come back in 1 year for your next physical, or sooner if needed.  Take care, Dr Jerline Pain  Please try these tips to maintain a healthy lifestyle:   Eat at least 3 REAL meals and 1-2 snacks per day.  Aim for no more than 5 hours between eating.  If you eat breakfast, please do so within one hour of getting up.    Obtain twice as many fruits/vegetables as protein or carbohydrate foods for both lunch and dinner. (Half of each meal should be fruits/vegetables, one quarter protein, and one quarter starchy carbs)   Cut down on sweet beverages. This includes juice, soda, and sweet tea.    Exercise at least 150 minutes every week.    Preventive Care 37 Years and Older, Male Preventive care refers to lifestyle choices and visits with your health care provider that can promote health and wellness. This includes:  A yearly physical exam. This is also called an annual well check.  Regular dental and eye exams.  Immunizations.  Screening for certain conditions.  Healthy lifestyle choices, such as diet and exercise. What can I expect for my preventive care visit? Physical exam Your health care provider will check:  Height and weight. These may be used to calculate body mass index (BMI), which is a measurement that tells if you are at a healthy weight.  Heart rate and blood pressure.  Your skin for abnormal spots. Counseling Your health care provider may ask you questions about:  Alcohol, tobacco, and drug use.  Emotional well-being.  Home and relationship well-being.  Sexual activity.  Eating habits.  History of falls.  Memory and ability to understand (cognition).  Work and work Statistician. What immunizations do I need?  Influenza (flu) vaccine  This is recommended every year. Tetanus, diphtheria, and pertussis (Tdap)  vaccine  You may need a Td booster every 10 years. Varicella (chickenpox) vaccine  You may need this vaccine if you have not already been vaccinated. Zoster (shingles) vaccine  You may need this after age 31. Pneumococcal conjugate (PCV13) vaccine  One dose is recommended after age 63. Pneumococcal polysaccharide (PPSV23) vaccine  One dose is recommended after age 41. Measles, mumps, and rubella (MMR) vaccine  You may need at least one dose of MMR if you were born in 1957 or later. You may also need a second dose. Meningococcal conjugate (MenACWY) vaccine  You may need this if you have certain conditions. Hepatitis A vaccine  You may need this if you have certain conditions or if you travel or work in places where you may be exposed to hepatitis A. Hepatitis B vaccine  You may need this if you have certain conditions or if you travel or work in places where you may be exposed to hepatitis B. Haemophilus influenzae type b (Hib) vaccine  You may need this if you have certain conditions. You may receive vaccines as individual doses or as more than one vaccine together in one shot (combination vaccines). Talk with your health care provider about the risks and benefits of combination vaccines. What tests do I need? Blood tests  Lipid and cholesterol levels. These may be checked every 5 years, or more frequently depending on your overall health.  Hepatitis C test.  Hepatitis B test. Screening  Lung cancer screening. You may  have this screening every year starting at age 54 if you have a 30-pack-year history of smoking and currently smoke or have quit within the past 15 years.  Colorectal cancer screening. All adults should have this screening starting at age 19 and continuing until age 30. Your health care provider may recommend screening at age 58 if you are at increased risk. You will have tests every 1-10 years, depending on your results and the type of screening test.   Prostate cancer screening. Recommendations will vary depending on your family history and other risks.  Diabetes screening. This is done by checking your blood sugar (glucose) after you have not eaten for a while (fasting). You may have this done every 1-3 years.  Abdominal aortic aneurysm (AAA) screening. You may need this if you are a current or former smoker.  Sexually transmitted disease (STD) testing. Follow these instructions at home: Eating and drinking  Eat a diet that includes fresh fruits and vegetables, whole grains, lean protein, and low-fat dairy products. Limit your intake of foods with high amounts of sugar, saturated fats, and salt.  Take vitamin and mineral supplements as recommended by your health care provider.  Do not drink alcohol if your health care provider tells you not to drink.  If you drink alcohol: ? Limit how much you have to 0-2 drinks a day. ? Be aware of how much alcohol is in your drink. In the [place] one drink equals one 12 oz bottle of beer (355 mL), one 5 oz glass of wine (148 mL), or one 1 oz glass of hard liquor (44 mL). Lifestyle  Take daily care of your teeth and gums.  Stay active. Exercise for at least 30 minutes on 5 or more days each week.  Do not use any products that contain nicotine or tobacco, such as cigarettes, e-cigarettes, and chewing tobacco. If you need help quitting, ask your health care provider.  If you are sexually active, practice safe sex. Use a condom or other form of protection to prevent STIs (sexually transmitted infections).  Talk with your health care provider about taking a low-dose aspirin or statin. What's next?  Visit your health care provider once a year for a well check visit.  Ask your health care provider how often you should have your eyes and teeth checked.  Stay up to date on all vaccines. This information is not intended to replace advice given to you by your health care provider. Make sure you discuss  any questions you have with your health care provider. Document Released: 02/03/2015 Document Revised: 01/01/2018 Document Reviewed: 01/01/2018 Elsevier Patient Education  [place].    Skin Biopsy, Care After This sheet gives you information about how to care for yourself after your procedure. Your health care provider may also give you more specific instructions. If you have problems or questions, contact your health care provider. What can I expect after the procedure? After the procedure, it is common to have:  Soreness.  Bruising.  Itching. Follow these instructions at home: Biopsy site care Follow instructions from your health care provider about how to take care of your biopsy site. Make sure you:  Wash your hands with soap and water before and after you change your bandage (dressing). If soap and water are not available, use hand sanitizer.  Apply ointment on your biopsy site as directed by your health care provider.  Change your dressing as told by your health care provider.  Leave stitches (sutures), skin  glue, or adhesive strips in place. These skin closures may need to stay in place for 2 weeks or longer. If adhesive strip edges start to loosen and curl up, you may trim the loose edges. Do not remove adhesive strips completely unless your health care provider tells you to do that.  If the biopsy area bleeds, apply gentle pressure for 10 minutes. Check your biopsy site every day for signs of infection. Check for:  Redness, swelling, or pain.  Fluid or blood.  Warmth.  Pus or a bad smell.  General instructions  Rest and then return to your normal activities as told by your health care provider.  Take over-the-counter and prescription medicines only as told by your health care provider.  Keep all follow-up visits as told by your health care provider. This is important. Contact a health care provider if:  You have redness, swelling, or pain around your  biopsy site.  You have fluid or blood coming from your biopsy site.  Your biopsy site feels warm to the touch.  You have pus or a bad smell coming from your biopsy site.  You have a fever.  Your sutures, skin glue, or adhesive strips loosen or come off sooner than expected. Get help right away if:  You have bleeding that does not stop with pressure or a dressing. Summary  After the procedure, it is common to have soreness, bruising, and itching at the site.  Follow instructions from your health care provider about how to take care of your biopsy site.  Check your biopsy site every day for signs of infection.  Contact a health care provider if you have redness, swelling, or pain around your biopsy site, or your biopsy site feels warm to the touch.  Keep all follow-up visits as told by your health care provider. This is important. This information is not intended to replace advice given to you by your health care provider. Make sure you discuss any questions you have with your health care provider. Document Released: 02/03/2015 Document Revised: 07/07/2017 Document Reviewed: 07/07/2017 Elsevier Patient Education  2020 [employer].

## 2018-10-26 NOTE — Assessment & Plan Note (Signed)
Check lipid panel.  Continue atorvastatin 40 mg daily and Zetia 5 mg daily.

## 2018-10-26 NOTE — Assessment & Plan Note (Signed)
Check PSA. ?

## 2018-10-26 NOTE — Assessment & Plan Note (Signed)
Slightly above goal.  Typically well controlled at home.  Continue home blood pressure monitoring with goal 140/90 or lower.  Discussed importance of low-sodium diet.

## 2018-10-26 NOTE — Progress Notes (Signed)
Chief Complaint:  Dustin Burton is a 68 y.o. male who presents today for his annual comprehensive physical exam.    Assessment/Plan:  Hyperlipidemia with target LDL less than 70 Check lipid panel.  Continue atorvastatin 40 mg daily and Zetia 5 mg daily.  Hypertension Slightly above goal.  Typically well controlled at home.  Continue home blood pressure monitoring with goal 140/90 or lower.  Discussed importance of low-sodium diet.  Chronic seasonal allergic rhinitis due to pollen Refilled Singulair.  History of prostate cancer s/p prostatectomy 2013 Check PSA.  Skin Lesion Shave biopsy performed today.  See below procedure note.  Preventative Healthcare: Flu vaccine given today.  Check CBC, C met, TSH, and PSA.  Patient Counseling(The following topics were reviewed and/or handout was given):  -Nutrition: Stressed importance of moderation in sodium/caffeine intake, saturated fat and cholesterol, caloric balance, sufficient intake of fresh fruits, vegetables, and fiber.  -Stressed the importance of regular exercise.   -Substance Abuse: Discussed cessation/primary prevention of tobacco, alcohol, or other drug use; driving or other dangerous activities under the influence; availability of treatment for abuse.   -Injury prevention: Discussed safety belts, safety helmets, smoke detector, smoking near bedding or upholstery.   -Sexuality: Discussed sexually transmitted diseases, partner selection, use of condoms, avoidance of unintended pregnancy and contraceptive alternatives.   -Dental health: Discussed importance of regular tooth brushing, flossing, and dental visits.  -Health maintenance and immunizations reviewed. Please refer to Health maintenance section.  Return to care in 1 year for next preventative visit.     Subjective:  HPI:  He has no acute complaints today.   He has had a skin lesion on his left lower leg for the last several months.  Seems to be increasing in size  and becoming more irritated.  Occasionally flaky.  No bleeding.  He is concerned about possible basal cell carcinoma.  He had 1 previously on his face that was similar in characteristic.   His stable, chronic medical conditions are outlined below:   # Dyslipidemia - On lipitor 75m and zetia 55mdaily.  Tolerating well - ROS: No reported myalgias  # Allergic Rhinitis - On flonase and singulair as needed seasonally  # History of Prostate Cancer s/p prostatectomy 2013 -Gets PSA checked yearly.  Lifestyle Diet: Tries eat a healthy and balanced diet. Exercise: Cyclist. Does a lot of road biking.   Depression screen PHQ 2/9 10/26/2018  Decreased Interest 0  Down, Depressed, Hopeless 0  PHQ - 2 Score 0  Altered sleeping 1  Tired, decreased energy 0  Change in appetite 0  Feeling bad or failure about yourself  0  Trouble concentrating 0  Moving slowly or fidgety/restless 0  Suicidal thoughts 0  PHQ-9 Score 1  Difficult doing work/chores Not difficult at all    Health Maintenance Due  Topic Date Due  . INFLUENZA VACCINE  08/22/2018  . PNA vac Low Risk Adult (2 of 2 - PPSV23) 09/04/2018     ROS: Per HPI, otherwise a complete review of systems was negative.   PMH:  The following were reviewed and entered/updated in epic: Past Medical History:  Diagnosis Date  . Brachial plexitis   . Elevated LFTs 07/2001  . Hemorrhoids   . Hypercholesteremia   . IBS (irritable bowel syndrome)   . [place]state cancer [place]  . Seasonal allergies    Patient Active Problem List   Diagnosis Date Noted  . Hypertension 12/01/2017  . Hamstring sprain 03/19/2017  . Chronic seasonal  allergic rhinitis due to pollen 02/12/2016  . History of prostate cancer s/p prostatectomy 2013 01/03/2014  . Bronchiectasis ([place]) 05/28/2013  . Polyneuropathy 02/17/2013  . Positive ANA (antinuclear antibody) 01/28/2013  . Lymphocytic colitis 03/31/2012  . Hyperlipidemia with target LDL less than 70 04/23/2007  .  Exercise-induced bronchospasm 03/20/2006   Past Surgical History:  Procedure Laterality Date  . HAND SURGERY Left    index finger  . KNEE SURGERY Right    right  . PROSTATECTOMY      Family History  Problem Relation Age of Onset  . Hypertension Mother   . Coronary artery disease Mother        PCI  . Crohn's disease Mother   . Hyperlipidemia Mother   . Hypertension Father        carotid endarterectomy  . Colon cancer Father   . Crohn's disease Father   . Hyperlipidemia Father   . Alzheimer's disease Father   . Cystic fibrosis Other        niece    Medications- reviewed and updated Current Outpatient Medications  Medication Sig Dispense Refill  . atorvastatin (LIPITOR) 40 MG tablet Take 1 tablet (40 mg total) by mouth daily. 90 tablet 3  . ezetimibe (ZETIA) 10 MG tablet Take 0.5 tablets (5 mg total) by mouth daily. 90 tablet 3  . fluticasone (FLONASE) 50 MCG/ACT nasal spray PLACE 2 SPRAYS INTO BOTH NOSTRILS DAILY. 48 g 3  . montelukast (SINGULAIR) 10 MG tablet Take 1 tablet (10 mg total) by mouth at bedtime. 90 tablet 0   No current facility-administered medications for this visit.     Allergies-reviewed and updated Allergies  Allergen Reactions  . Tadalafil Other (See Comments)    Muscular leg pain    Social History   Socioeconomic History  . Marital status: Married    Spouse name: Not on file  . Number of children: Not on file  . Years of education: Not on file  . Highest education level: Not on file  Occupational History  . Occupation: [employer]    Employer: RETIRED    Comment: Retired  Scientific laboratory technician  . Financial resource strain: Not on file  . Food insecurity    Worry: Not on file    Inability: Not on file  . Transportation needs    Medical: Not on file    Non-medical: Not on file  Tobacco Use  . Smoking status: Never Smoker  . Smokeless tobacco: Never Used  Substance and Sexual Activity  . Alcohol use: Yes     Comment: socially  . Drug use: No  . Sexual activity: Not on file  Lifestyle  . Physical activity    Days per week: Not on file    Minutes per session: Not on file  . Stress: Not on file  Relationships  . Social Herbalist on phone: Not on file    Gets together: Not on file    Attends religious service: Not on file    Active member of club or organization: Not on file    Attends meetings of clubs or organizations: Not on file    Relationship status: Not on file  Other Topics Concern  . Not on file  Social History Narrative  . Not on file        Objective:  Physical Exam: BP (!) 152/88   Pulse (!) 55   Temp 98.4 F (36.9 C)   Ht '5\' 11"'  (1.803  m)   Wt 171 lb 4 oz (77.7 kg)   SpO2 99%   BMI 23.88 kg/m   Body mass index is 23.88 kg/m. Wt Readings from Last 3 Encounters:  10/26/18 171 lb 4 oz (77.7 kg)  07/29/18 165 lb (74.8 kg)  07/27/18 162 lb (73.5 kg)   Gen: NAD, resting comfortably HEENT: TMs normal bilaterally. OP clear. No thyromegaly noted.  CV: RRR with no murmurs appreciated Pulm: NWOB, CTAB with no crackles, wheezes, or rhonchi GI: Normal bowel sounds present. Soft, Nontender, Nondistended. MSK: no edema, cyanosis, or clubbing noted Skin: warm, dry.  Approximately 0.5 cm x 1 cm erythematous lesion on left medial shin. Neuro: CN2-12 grossly intact. Strength 5/5 in upper and lower extremities. Reflexes symmetric and intact bilaterally.  Psych: Normal affect and thought content  Shave Biopsy Procedure Note  Pre-operative Diagnosis: Suspicious lesion  Post-operative Diagnosis: same  Locations: Left Leg  Indications: Diagnostic  Anesthesia: Lidocaine 1% with epinephrine without added sodium bicarbonate  Procedure Details  History of allergy to iodine: no  Patient informed of the risks (including bleeding and infection) and benefits of the  procedure and Written informed consent obtained.  The lesion and surrounding area were given a  sterile prep using betadyne and draped in the usual sterile fashion. A scalpel was used to shave an area of skin approximately 1cm by 1cm.  Hemostasis achieved with pressure. Antibiotic ointment and a sterile dressing applied.  The specimen was sent for pathologic examination. The patient tolerated the procedure well.  EBL: 1 ml  Condition: Stable  Complications: none.  Plan: 1. Instructed to keep the wound dry and covered for 24-48h and clean thereafter. 2. Warning signs of infection were reviewed.   3. Recommended that the patient use OTC analgesics as needed for pain.       Algis Greenhouse. Jerline Pain, MD 10/26/2018 3:19 PM

## 2018-10-27 LAB — COMPREHENSIVE METABOLIC PANEL
ALT: 19 U/L (ref 0–53)
AST: 22 U/L (ref 0–37)
Albumin: 4.4 g/dL (ref 3.5–5.2)
Alkaline Phosphatase: 63 U/L (ref 39–117)
BUN: 16 mg/dL (ref 6–23)
CO2: 26 mEq/L (ref 19–32)
Calcium: 9.6 mg/dL (ref 8.4–10.5)
Chloride: 105 mEq/L (ref 96–112)
Creatinine, Ser: 0.91 mg/dL (ref 0.40–1.50)
GFR: 82.7 mL/min (ref >60.00)
Glucose, Bld: 104 mg/dL — ABNORMAL HIGH (ref 70–99)
Potassium: 3.7 mEq/L (ref 3.5–5.1)
Sodium: 139 mEq/L (ref 135–145)
Total Bilirubin: 0.5 mg/dL (ref 0.2–1.2)
Total Protein: 7 g/dL (ref 6.0–8.3)

## 2018-10-27 LAB — LIPID PANEL
Cholesterol: 110 mg/dL (ref 0–200)
HDL: 45.8 mg/dL (ref >39.00)
LDL Cholesterol: 49 mg/dL (ref 0–99)
NonHDL: 64.09
Total CHOL/HDL Ratio: 2
Triglycerides: 74 mg/dL (ref 0.0–149.0)
VLDL: 14.8 mg/dL (ref 0.0–40.0)

## 2018-10-27 LAB — CBC
HCT: 41.4 % (ref 39.0–52.0)
Hemoglobin: 13.9 g/dL (ref 13.0–17.0)
MCHC: 33.5 g/dL (ref 30.0–36.0)
MCV: 97.2 fl (ref 78.0–100.0)
Platelets: 268 10*3/uL (ref 150.0–400.0)
RBC: 4.26 Mil/uL (ref 4.22–5.81)
RDW: 13.2 % (ref 11.5–15.5)
WBC: 5.3 10*3/uL (ref 4.0–10.5)

## 2018-10-27 LAB — TSH: TSH: 1.17 u[IU]/mL (ref 0.35–4.50)

## 2018-10-27 LAB — PSA: PSA: 0 ng/mL — ABNORMAL LOW (ref 0.10–4.00)

## 2018-10-30 ENCOUNTER — Encounter: Payer: Self-pay | Admitting: Family Medicine

## 2018-10-30 DIAGNOSIS — R739 Hyperglycemia, unspecified: Secondary | ICD-10-CM | POA: Insufficient documentation

## 2018-10-30 NOTE — Progress Notes (Signed)
Please inform patient of the following:  Skin biopsy showed sun damaged skin - no signs of anything serious. Do not need to do anything further at this time. His PSA is 0 Blood sugar is borderline but everything else is NORMAL.  Do not need to make any changes to his treatment plan at this time. Would like for him to keep up the good work and we can recheck in a year or so.  Algis Greenhouse. Jerline Pain, MD 10/30/2018 4:14 PM

## 2018-12-03 DIAGNOSIS — H2513 Age-related nuclear cataract, bilateral: Secondary | ICD-10-CM | POA: Diagnosis not present

## 2018-12-03 DIAGNOSIS — H1131 Conjunctival hemorrhage, right eye: Secondary | ICD-10-CM | POA: Diagnosis not present

## 2018-12-03 DIAGNOSIS — H1045 Other chronic allergic conjunctivitis: Secondary | ICD-10-CM | POA: Diagnosis not present

## 2018-12-03 DIAGNOSIS — H5789 Other specified disorders of eye and adnexa: Secondary | ICD-10-CM | POA: Diagnosis not present

## 2019-03-16 ENCOUNTER — Other Ambulatory Visit: Payer: Self-pay | Admitting: Internal Medicine

## 2019-03-16 ENCOUNTER — Other Ambulatory Visit: Payer: Self-pay | Admitting: Family Medicine

## 2019-03-16 DIAGNOSIS — J301 Allergic rhinitis due to pollen: Secondary | ICD-10-CM

## 2019-03-16 DIAGNOSIS — J45990 Exercise induced bronchospasm: Secondary | ICD-10-CM

## 2019-03-16 MED FILL — EZETIMIBE 10 MG TABS: 10 | 30 days supply | Qty: 15 | Fill #0

## 2019-03-16 MED FILL — ATORVASTATIN 40 MG TABLET: 40 | 90 days supply | Qty: 90 | Fill #1

## 2019-03-16 MED FILL — MONTELUKAST SOD 10 MG TAB: 10 | 90 days supply | Qty: 90 | Fill #0

## 2019-04-21 ENCOUNTER — Telehealth: Payer: Self-pay | Admitting: Family Medicine

## 2019-04-21 NOTE — Telephone Encounter (Signed)
I left a message asking the pt to call and schedule AWV-I w/ Loma Sousa.  If pt calls back, please schedule AWV w/ Health Coach at next available opening.

## 2019-04-30 ENCOUNTER — Ambulatory Visit: Payer: PPO

## 2019-05-04 ENCOUNTER — Encounter: Payer: Self-pay | Admitting: Family Medicine

## 2019-05-04 NOTE — Progress Notes (Signed)
Cardiology Office Note    Date:  05/06/2019   ID:  Dustin Burton, DOB January 22, 1950, MRN [id number]  PCP:  Vivi Barrack, MD  Cardiologist:  Dr. Harrington Challenger  Chief Complaint: 18 Months follow up for CAD  History of Present Illness:   Dustin Burton is a 69 y.o. male with history of CAD by coronary calcification on CT, hypertension, history of prostate cancer s/p prostatectomy in 2013 and hyperlipidemia seen for longer to follow-up.  Last seen by Dr. Harrington Challenger October 2019.   Here today for follow-up.  Previously on amlodipine 2.5 mg for high blood pressure but had a dizziness and discontinued.  He managed his blood pressure with diet over the years.  However due to pandemic he has slacked off his diet.  Has not checked his blood pressure lately.  Blood pressure 150/70 today.  He is a bicyclist.  He denies exertional chest pain, shortness of breath, dizziness or syncope.  No palpitation, orthopnea, PND, syncope or melena.  Past Medical History:  Diagnosis Date  . Brachial plexitis   . Elevated LFTs 07/2001  . Hemorrhoids   . Hypercholesteremia   . IBS (irritable bowel syndrome)   . Prostate cancer ([place])   . Seasonal allergies     Past Surgical History:  Procedure Laterality Date  . HAND SURGERY Left    index finger  . KNEE SURGERY Right    right  . PROSTATECTOMY      Current Medications: Prior to Admission medications   Medication Sig Start Date End Date Taking? Authorizing Provider  atorvastatin (LIPITOR) 40 MG tablet Take 1 tablet (40 mg total) by mouth daily. 10/26/18   Vivi Barrack, MD  ezetimibe (ZETIA) 10 MG tablet Take 0.5 tablets (5 mg total) by mouth daily. Please schedule annual appt with Dr. Harrington Challenger for refills. [phone]. 1st attempt. 03/16/19   Fay Records, MD  fluticasone [facility]) 50 MCG/ACT nasal spray PLACE 2 SPRAYS INTO BOTH NOSTRILS DAILY. 03/05/17   Janith Lima, MD  montelukast (SINGULAIR) 10 MG tablet TAKE 1 TABLET (10 MG TOTAL) BY MOUTH AT BEDTIME. 03/16/19    Vivi Barrack, MD    Allergies:   Tadalafil   Social History   Socioeconomic History  . Marital status: Married    Spouse name: Not on file  . Number of children: Not on file  . Years of education: Not on file  . Highest education level: Not on file  Occupational History  . 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 Smoking status: Never Smoker  . Smokeless tobacco: Never Used  Substance and Sexual Activity  . Alcohol use: Yes    Comment: socially  . Drug use: No  . Sexual activity: Not on file  Other Topics Concern  . Not on file  Social History Narrative  . Not on file   Social Determinants of Health   Financial Resource Strain:   . Difficulty of Paying Living Expenses:   Food Insecurity:   . Worried About Charity fundraiser in the Last Year:   . Arboriculturist in the Last Year:   Transportation Needs:   . Film/video editor (Medical):   [name] Lack of Transportation (Non-Medical):   Physical Activity:   . Days of Exercise per Week:   . Minutes of Exercise per Session:   Stress:   . Feeling of Stress :  Social Connections:   . Frequency of Communication with Friends and Family:   . Frequency of Social Gatherings with Friends and Family:   . Attends Religious Services:   . Active Member of Clubs or Organizations:   . Attends Archivist Meetings:   [name] Marital Status:      Family History:  The patient's family history includes Alzheimer's disease in his father; Colon cancer in his father; Coronary artery disease in his mother; Crohn's disease in his father and mother; Cystic fibrosis in an other family member; Hyperlipidemia in his father and mother; Hypertension in his father and mother.   ROS:   Please see the history of present illness.    ROS All other systems reviewed and are negative.   PHYSICAL EXAM:   VS:  BP (!) 150/70   Pulse 72   Ht 5\' 11"  (1.803 m)   Wt 168 lb 12.8  oz (76.6 kg)   SpO2 98%   BMI 23.54 kg/m    GEN: Well nourished, well developed, in no acute distress  HEENT: normal  Neck: no JVD, carotid bruits, or masses Cardiac: RRR; no murmurs, rubs, or gallops,no edema  Respiratory:  clear to auscultation bilaterally, normal work of breathing GI: soft, nontender, nondistended, + BS MS: no deformity or atrophy  Skin: warm and dry, no rash Neuro:  Alert and Oriented x 3, Strength and sensation are intact Psych: euthymic mood, full affect  Wt Readings from Last 3 Encounters:  05/06/19 168 lb 12.8 oz (76.6 kg)  10/26/18 171 lb 4 oz (77.7 kg)  07/29/18 165 lb (74.8 kg)      Studies/Labs Reviewed:   EKG:  EKG is ordered today.  The ekg ordered today demonstrates normal sinus rhythm at rate of 72 bpm, PVC  Recent Labs: 10/26/2018: ALT 19; BUN 16; Creatinine, Ser 0.91; Hemoglobin 13.9; Platelets 268.0; Potassium 3.7; Sodium 139; TSH 1.17   Lipid Panel    Component Value Date/Time   CHOL 110 10/26/2018 1507   TRIG 74.0 10/26/2018 1507   HDL 45.80 10/26/2018 1507   CHOLHDL 2 10/26/2018 1507   VLDL 14.8 10/26/2018 1507   LDLCALC 49 10/26/2018 1507   LDLDIRECT 112 (H) 08/02/2011 1228    Additional studies/ records that were reviewed today include:   CT of the chest November 2015 IMPRESSION: 1. Stable biapical scarring, likely postinfectious. 2. Extensive coronary atherosclerosis.    ASSESSMENT & PLAN:    1. CAD by CT of chest - He is a bicyclist.  Anginal symptoms.  Continue statin.  2.  Hypertension -Blood pressure elevated.  Previously controlled with diet.  After long discussion agreed to retry amlodipine 2.5 mg daily.  He will let us know response.  Blood pressure goal 130/70.  3.  Hyperlipidemia -Followed by PCP (LDL goal less than 70) -Continue statin - 10/26/2018: Cholesterol 110; HDL 45.80; LDL Cholesterol 49; Triglycerides 74.0; VLDL 14.8  4.  PVC -Asymptomatic.  Encourage electrolytes drink like Gatorade or V8,  especially while bicycling.   Medication Adjustments/Labs and Tests Ordered: Current medicines are reviewed at length with the patient today.  Concerns regarding medicines are outlined above.  Medication changes, Labs and Tests ordered today are listed in the Patient Instructions below. Patient Instructions  Medication Instructions:  Your physician has recommended you make the following change in your medication:  1.  START Amlodiine 2.5 mg daily   *If you need a refill on your cardiac medications before your next appointment, please call your pharmacy*  Lab Work: None ordered  If you have labs (blood work) drawn today and your tests are completely normal, you will receive your results only by: [name] MyChart Message (if you have MyChart) OR . A paper copy in the mail If you have any lab test that is abnormal or we need to change your treatment, we will call you to review the results.   Testing/Procedures: None ordered    Follow-Up: At [facility], you and your health needs are our priority.  As part of our continuing mission to provide you with exceptional heart care, we have created designated Provider Care Teams.  These Care Teams include your primary Cardiologist (physician) and Advanced Practice Providers (APPs -  Physician Assistants and Nurse Practitioners) who all work together to provide you with the care you need, when you need it.  We recommend signing up for the patient portal called "MyChart".  Sign up information is provided on this After Visit Summary.  MyChart is used to connect with patients for Virtual Visits (Telemedicine).  Patients are able to view lab/test results, encounter notes, upcoming appointments, etc.  Non-urgent messages can be sent to your provider as well.   To learn more about what you can do with MyChart, go to [email or web address].    Your next appointment:   12 month(s)  The format for your next appointment:   In Person  Provider:   You  may see Dorris Carnes, MD or one of the following Advanced Practice Providers on your designated Care Team:    Richardson Dopp, PA-C  [place], [place]  Daune Perch, NP    Other Instructions      Jarrett Soho, [place]  05/06/2019 8:43 AM    [address], [place], [place]  [address] Phone: [phone]; Fax: [phone]

## 2019-05-04 NOTE — Telephone Encounter (Signed)
Hello, can you look into this.

## 2019-05-06 ENCOUNTER — Ambulatory Visit: Payer: PPO | Admitting: Physician Assistant

## 2019-05-06 ENCOUNTER — Other Ambulatory Visit: Payer: Self-pay | Admitting: Physician Assistant

## 2019-05-06 ENCOUNTER — Other Ambulatory Visit: Payer: Self-pay

## 2019-05-06 ENCOUNTER — Encounter: Payer: Self-pay | Admitting: Physician Assistant

## 2019-05-06 VITALS — BP 150/70 | HR 72 | Ht 71.0 in | Wt 168.8 lb

## 2019-05-06 DIAGNOSIS — I2510 Atherosclerotic heart disease of native coronary artery without angina pectoris: Secondary | ICD-10-CM

## 2019-05-06 DIAGNOSIS — E785 Hyperlipidemia, unspecified: Secondary | ICD-10-CM | POA: Diagnosis not present

## 2019-05-06 DIAGNOSIS — I493 Ventricular premature depolarization: Secondary | ICD-10-CM | POA: Diagnosis not present

## 2019-05-06 DIAGNOSIS — I10 Essential (primary) hypertension: Secondary | ICD-10-CM | POA: Diagnosis not present

## 2019-05-06 MED ORDER — AMLODIPINE BESYLATE 2.5 MG PO TABS: 2.5000 mg | ORAL_TABLET | Freq: Every day | ORAL | 3 refills | Status: DC

## 2019-05-06 MED ORDER — EZETIMIBE 10 MG PO TABS: 5.0000 mg | ORAL_TABLET | Freq: Every day | ORAL | 3 refills | Status: DC

## 2019-05-06 MED FILL — EZETIMIBE 10 MG TABS: 10 | 90 days supply | Qty: 45 | Fill #0

## 2019-05-06 MED FILL — AMLODIPINE 2.5 MG TABLET: 2.5 | 90 days supply | Qty: 90 | Fill #0

## 2019-05-06 NOTE — Patient Instructions (Signed)
Medication Instructions:  Your physician has recommended you make the following change in your medication:  1.  START Amlodiine 2.5 mg daily   *If you need a refill on your cardiac medications before your next appointment, please call your pharmacy*   Lab Work: None ordered  If you have labs (blood work) drawn today and your tests are completely normal, you will receive your results only by: [name] MyChart Message (if you have MyChart) OR . A paper copy in the mail If you have any lab test that is abnormal or we need to change your treatment, we will call you to review the results.   Testing/Procedures: None ordered    Follow-Up: At [facility], you and your health needs are our priority.  As part of our continuing mission to provide you with exceptional heart care, we have created designated Provider Care Teams.  These Care Teams include your primary Cardiologist (physician) and Advanced Practice Providers (APPs -  Physician Assistants and Nurse Practitioners) who all work together to provide you with the care you need, when you need it.  We recommend signing up for the patient portal called "MyChart".  Sign up information is provided on this After Visit Summary.  MyChart is used to connect with patients for Virtual Visits (Telemedicine).  Patients are able to view lab/test results, encounter notes, upcoming appointments, etc.  Non-urgent messages can be sent to your provider as well.   To learn more about what you can do with MyChart, go to [email or web address].    Your next appointment:   12 month(s)  The format for your next appointment:   In Person  Provider:   You may see Dorris Carnes, MD or one of the following Advanced Practice Providers on your designated Care Team:    Richardson Dopp, PA-C  [place], [place]  Daune Perch, NP    Other Instructions

## 2019-06-23 ENCOUNTER — Ambulatory Visit: Payer: PPO | Admitting: Family Medicine

## 2019-06-23 ENCOUNTER — Other Ambulatory Visit: Payer: Self-pay

## 2019-06-23 ENCOUNTER — Ambulatory Visit (INDEPENDENT_AMBULATORY_CARE_PROVIDER_SITE_OTHER): Payer: PPO

## 2019-06-23 ENCOUNTER — Ambulatory Visit: Payer: Self-pay

## 2019-06-23 ENCOUNTER — Encounter: Payer: Self-pay | Admitting: Family Medicine

## 2019-06-23 VITALS — BP 138/80 | HR 68 | Ht 71.0 in | Wt 165.6 lb

## 2019-06-23 DIAGNOSIS — M25572 Pain in left ankle and joints of left foot: Secondary | ICD-10-CM

## 2019-06-23 NOTE — Progress Notes (Signed)
I, Wendy Poet, LAT, ATC, am serving as scribe for Dr. Lynne Leader.  Dustin Burton is a 69 y.o. male who presents to [place] [facility] today for L ankle pain x 8 weeks.  He was last seen by Dr. Tamala Julian on 08/03/18 for a concussion.  Today, he reports L ankle pain that he locates to his L medial Achille's and the space between his L Achille's and L medial malleolus.  He rates his pain as severe at it's worst and he describes his pain as sharp, shooting pain that has occasionally causes him to fall.  He notes the pain started after he tripped a bit few months ago.  He notes pain is bothersome and interfering with his ability to cycle.  He is an avid road cyclist averaging more than 100 miles per week.  He had a changes foot positioning in his pedal stroke which is now causing some knee and hip pain a bit.  Radiating pain: yes in his medial shin L ankle swelling: yes just medial to the L Achille's Aggravating factors: Walking up an incline; cycling; end-range ankle DF Treatments tried: ice, compression   Pertinent review of systems: No fevers or chills  Relevant historical information: History prostate cancer with prostatectomy in 2013, history polyneuropathy and positive ANA.  Lymphocytic colitis   Exam:  BP 138/80 (BP Location: Left Arm, Patient Position: Sitting, Cuff Size: Normal)   Pulse 68   Ht 5\' 11"  (1.803 m)   Wt 165 lb 9.6 oz (75.1 kg)   SpO2 97%   BMI 23.10 kg/m  General: Well Developed, well nourished, and in no acute distress.   MSK: Left ankle normal-appearing no swelling.  No deformity. Normal ankle motion. Slight palpable nodularity at Achilles tendon approximately 6 cm proximal to insertion at the calcaneus. Nontender to palpation across ankle. Normal strength. Pulses cap refill and sensation are intact distally. No laxity.    Lab and Radiology Results  X-ray images left ankle obtained today personally and independently reviewed No  fractures or significant degenerative changes.  No lytic lesions.  Possible small bone cyst at medial malleolus. Tiny osteophyte at Achilles tendon insertion at calcaneus visible. Await formal radiology review  Diagnostic Limited MSK Ultrasound of: Left ankle Achilles tendon intact normal-appearing.  No visible nodularity. Posterior tibialis tendon flexor digitorum longus and other medial talar tunnel structures normal-appearing Peroneal tendon normal-appearing with no obvious tear or deformity Impression: Normal posterior ankle ultrasound   Assessment and Plan: 69 y.o. male with left ankle pain.  Occurring after injury approximately 2 months ago.  Generally normal exam and ultrasound examination today.  Plan for home exercise rehab program as taught in clinic today by ATC.  Additionally will refer to physical therapy.  Recommend also Voltaren gel and compressive ankle sleeve.  Check back in  4 to 6 weeks especially if not improved.  At that point would consider MRI.   PDMP not reviewed this encounter. Orders Placed This Encounter  Procedures  . [place] LIMITED JOINT SPACE STRUCTURES LOW LEFT(NO LINKED CHARGES)    Order Specific Question:   Reason for Exam (SYMPTOM  OR DIAGNOSIS REQUIRED)    Answer:   L ankle pain    Order Specific Question:   Preferred imaging location?    Answer:   [place]  . DG Ankle Complete Left    Standing Status:   Future    Number of Occurrences:   1    Standing Expiration  Date:   06/22/2020    Order Specific Question:   Reason for Exam (SYMPTOM  OR DIAGNOSIS REQUIRED)    Answer:   eval left ankle pain    Order Specific Question:   Preferred imaging location?    Answer:   [facility]    Order Specific Question:   Radiology Contrast Protocol - do NOT remove file path    Answer:   [email or web address]  . Ambulatory referral to Physical Therapy    Referral Priority:   Routine    Referral  Type:   Physical Medicine    Referral Reason:   Specialty Services Required    Requested Specialty:   Physical Therapy   No orders of the defined types were placed in this encounter.    Discussed warning signs or symptoms. Please see discharge instructions. Patient expresses understanding.   The above documentation has been reviewed and is accurate and complete Lynne Leader, M.D.

## 2019-06-23 NOTE — Patient Instructions (Addendum)
Please perform the exercise program that we have prepared for you and gone over in detail on a daily basis.  In addition to the handout you were provided you can access your program through: [email or web address]   Your unique program code is: [id number]  Plan for PT and home exercises.  Use body helix full ankle sleeve during and following exercises.  Use voltaren get up to 4x daily.  Recheck in 6 weeks.  Get xray today on the way out.  Contact me sooner if having a problem.   I recommend you obtained a compression sleeve to help with your joint problems. There are many options on the market however I recommend obtaining a Body Helix compression sleeve.  You can find information (including how to appropriate measure yourself for sizing) can be found at www.Body http://www.lambert.com/.  Many of these products are health savings account (HSA) eligible.   You can use the compression sleeve at any time throughout the day but is most important to use while being active as well as for 2 hours post-activity.   It is appropriate to ice following activity with the compression sleeve in place.

## 2019-06-24 ENCOUNTER — Encounter: Payer: Self-pay | Admitting: Family Medicine

## 2019-06-24 NOTE — Progress Notes (Signed)
Left ankle looks pretty normal to radiology.  Incidental bone cyst seen in x-ray and ankle.  Likely not causing pain.

## 2019-06-28 ENCOUNTER — Ambulatory Visit: Payer: PPO | Admitting: Physical Therapy

## 2019-06-28 ENCOUNTER — Other Ambulatory Visit: Payer: Self-pay

## 2019-06-28 DIAGNOSIS — M25571 Pain in right ankle and joints of right foot: Secondary | ICD-10-CM | POA: Diagnosis not present

## 2019-06-29 ENCOUNTER — Encounter: Payer: Self-pay | Admitting: Physical Therapy

## 2019-06-29 NOTE — Therapy (Signed)
[facility] [address] [place], [place], [address] Phone: [phone]   Fax:  [phone]  Physical Therapy Evaluation  Patient Details  Name: MARSHAUN LORTIE MRN: [id number] Date of Birth: 1950/06/22 Referring Provider (PT): Lynne Leader   Encounter Date: 06/28/2019  PT End of Session - 06/29/19 1049    Visit Number  1    Number of Visits  12    Date for PT Re-Evaluation  08/09/19    Authorization Type  HTA    PT Start Time  1345    PT Stop Time  1428    PT Time Calculation (min)  43 min    Activity Tolerance  Patient tolerated treatment well    Behavior During Therapy  [facility] for tasks assessed/performed       Past Medical History:  Diagnosis Date  . Brachial plexitis   . Elevated LFTs 07/2001  . Hemorrhoids   . Hypercholesteremia   . IBS (irritable bowel syndrome)   . Prostate cancer ([place])   . Seasonal allergies     Past Surgical History:  Procedure Laterality Date  . HAND SURGERY Left    index finger  . KNEE SURGERY Right    right  . PROSTATECTOMY      There were no vitals filed for this visit.   Subjective Assessment - 06/29/19 1046    Subjective  Pt states L achilles pain since April. No injury to report, no change in activity or shoes. Is avid cyclist. Also states soreness recently in R knee and hip since foot has been sore. States mild improved pain with dress shoe because of heel elevation. Pain is somewhat better than initially. Has been able to continue cycling, with foot locked into a bit of PF vs neutral or DF positoin.    Currently in Pain?  Yes    Pain Score  4     Pain Location  Ankle    Pain Orientation  Left    Pain Descriptors / Indicators  Aching    Pain Type  Acute pain    Pain Onset  More than a month ago    Pain Frequency  Intermittent         [facility] PT Assessment - 06/29/19 0001      Assessment   Medical Diagnosis  L ankle pain    Referring Provider (PT)  Lynne Leader    Prior Therapy  no       Balance Screen   Has the patient fallen in the past 6 months  No      Prior Function   Level of Independence  Independent      Cognition   Overall Cognitive Status  Within Functional Limits for tasks assessed      Posture/Postural Control   Posture Comments  Foot posture: mostly neutral, low/medium arch,       ROM / Strength   AROM / PROM / Strength  AROM;Strength      AROM   Overall AROM Comments  Mild limitation for bil hip flex and ER, Other LE: WNL, mild limitation for DF bilaterally      Strength   Overall Strength Comments  Hips: 4/5, Knee: 5/5, Ankle: 4/5       Palpation   Palpation comment  Minimal pain to mid achilles tendon, no thickening, Pain at distal insertion of calcaneus , central and medial.  Tightness and trigger points in L gastroc.       Ambulation/Gait  Gait Comments  unremarkable, good foot posture barefoot, and with shoes on.                   Objective measurements completed on examination: See above findings.      [facility] Adult PT Treatment/Exercise - 06/29/19 0001      Exercises   Exercises  Ankle      Modalities   Modalities  Iontophoresis      Iontophoresis   Type of Iontophoresis  Dexamethasone    Location  L distal achilles     Time  4 hr patch      Manual Therapy   Manual Therapy  Soft tissue mobilization    Soft tissue mobilization  DTM and IASTM to L Achilles and gastroc      Ankle Exercises: Standing   Heel Raises  15 reps   shoes off            PT Education - 06/29/19 1048    Education Details  Correct performance of Heel raises x20/2x/day for HEP,  Exam findings, POC.    Person(s) Educated  Patient    Methods  Explanation;Demonstration;Verbal cues    Comprehension  Verbalized understanding;Returned demonstration;Verbal cues required;Need further instruction       PT Short Term Goals - 06/29/19 1051      PT SHORT TERM GOAL #1   Title  Pt to be independent with initial HEP    Time  2    Period   Weeks    Status  New    Target Date  07/12/19        PT Long Term Goals - 06/29/19 1051      PT LONG TERM GOAL #1   Title  Pt to be independent with final HEP    Time  6    Period  Weeks    Status  New    Target Date  08/09/19      PT LONG TERM GOAL #2   Title  Pt to report decreased pain in L achilles to 0-1/10 with standing, walking, and exercise, up to 1 hr.    Time  6    Period  Weeks    Status  New    Target Date  08/09/19      PT LONG TERM GOAL #3   Title  Pt to demo improved soft tissue limitations in L gastroc to be WNL, to reduce pain and improve DF.    Time  6    Period  Weeks    Status  New    Target Date  08/09/19      PT LONG TERM GOAL #4   Title  Pt to demo ability for stability of L foot/ankle to be WNL, for multi direction activity, to improve pain and ability for functional and community activitiy.    Time  6    Period  Weeks    Status  New    Target Date  08/09/19             Plan - 06/29/19 1054    Clinical Impression Statement  Pt presents with primary complaint of increased pain in L foot/ankle/achilles. Pt with increased muscle tension and soreness in L gastroc, with mild limitation for DF ROM. Pt with increased pain at achilles insertion, with increased pain in standing, walking, and prolnged activity and multi-direction movements. Pt with decreased ability for full functional activities and ability for exercise, due to pain. Pt to benefit from  skilled PT to improve.    Examination-Activity Limitations  Squat;Stairs;Locomotion Level;Stand    Examination-Participation Restrictions  Community Activity;Shop;Yard Work    Stability/Clinical Decision Making  Stable/Uncomplicated    Clinical Decision Making  Low    Rehab Potential  Good    PT Frequency  2x / week    PT Duration  6 weeks    PT Treatment/Interventions  ADLs/Self Care Home Management;Cryotherapy;Electrical Stimulation;Gait training;Parrafin;Ultrasound;Traction;Moist  Heat;Iontophoresis 4mg /ml Dexamethasone;Stair training;Functional mobility training;Therapeutic activities;Therapeutic exercise;Balance training;Neuromuscular re-education;Manual techniques;Orthotic Fit/Training;Patient/family education;Passive range of motion;Dry needling;Taping;Joint Manipulations;Spinal Manipulations;Vasopneumatic Device    Consulted and Agree with Plan of Care  Patient       Patient will benefit from skilled therapeutic intervention in order to improve the following deficits and impairments:  Decreased range of motion, Increased muscle spasms, Decreased activity tolerance, Pain, Decreased mobility, Decreased strength  Visit Diagnosis: Pain in right ankle and joints of right foot     Problem List Patient Active Problem List   Diagnosis Date Noted  . Hyperglycemia 10/30/2018  . Hypertension 12/01/2017  . Hamstring sprain 03/19/2017  . Chronic seasonal allergic rhinitis due to pollen 02/12/2016  . History of prostate cancer s/p prostatectomy 2013 01/03/2014  . Bronchiectasis ([place]) 05/28/2013  . Polyneuropathy 02/17/2013  . Positive ANA (antinuclear antibody) 01/28/2013  . Lymphocytic colitis 03/31/2012  . Hyperlipidemia with target LDL less than 70 04/23/2007  . Exercise-induced bronchospasm 03/20/2006    Lyndee Hensen, PT, DPT 10:57 AM  06/29/19    [facility] [place], [place], [address] Phone: [phone]   Fax:  [phone]  Name: PACO CISLO MRN: [id number] Date of Birth: [birth year]

## 2019-06-30 ENCOUNTER — Encounter: Payer: Self-pay | Admitting: Physical Therapy

## 2019-06-30 ENCOUNTER — Ambulatory Visit: Payer: PPO | Admitting: Physical Therapy

## 2019-06-30 ENCOUNTER — Other Ambulatory Visit: Payer: Self-pay

## 2019-06-30 DIAGNOSIS — M25571 Pain in right ankle and joints of right foot: Secondary | ICD-10-CM

## 2019-06-30 NOTE — Therapy (Signed)
[facility] [address] [place], [place], [address] Phone: [phone]   Fax:  [phone]  Physical Therapy Treatment  Patient Details  Name: KAZIM CORRALES MRN: [id number] Date of Birth: 05-25-1950 Referring Provider (PT): Lynne Leader   Encounter Date: 06/30/2019  PT End of Session - 06/30/19 1527    Visit Number  2    Number of Visits  12    Date for PT Re-Evaluation  08/09/19    Authorization Type  HTA    PT Start Time  1520    PT Stop Time  1600    PT Time Calculation (min)  40 min    Activity Tolerance  Patient tolerated treatment well    Behavior During Therapy  [facility] for tasks assessed/performed       Past Medical History:  Diagnosis Date  . Brachial plexitis   . Elevated LFTs 07/2001  . Hemorrhoids   . Hypercholesteremia   . IBS (irritable bowel syndrome)   . Prostate cancer ([place])   . Seasonal allergies     Past Surgical History:  Procedure Laterality Date  . HAND SURGERY Left    index finger  . KNEE SURGERY Right    right  . PROSTATECTOMY      There were no vitals filed for this visit.  Subjective Assessment - 06/30/19 1526    Subjective  Pt with improved soreness after last visit. ALso was stung by bee yesterday in L calf, mild swelling in lower leg today.    Currently in Pain?  Yes    Pain Score  3     Pain Location  Ankle    Pain Orientation  Left    Pain Descriptors / Indicators  Aching    Pain Type  Acute pain    Pain Onset  More than a month ago    Pain Frequency  Intermittent                        [facility] Adult PT Treatment/Exercise - 06/30/19 1527      Ambulation/Gait   Gait Comments  --      Posture/Postural Control   Posture Comments  Foot posture: mostly neutral, low/medium arch,       Exercises   Exercises  Ankle      Modalities   Modalities  Iontophoresis      Iontophoresis   Type of Iontophoresis  Dexamethasone    Location  L post tib, behind med malleolus    Time  4 hr patch      Manual Therapy   Manual Therapy  Soft tissue mobilization    Soft tissue mobilization  DTM and IASTM to L Achilles , medial gastroc and PTT.      Ankle Exercises: Standing   SLS  30 sec x 3 bil;     Heel Raises  20 reps   shoes off     Ankle Exercises: Stretches   Gastroc Stretch  3 reps;30 seconds    Gastroc Stretch Limitations  at wall       Ankle Exercises: Aerobic   Recumbent Bike  L3 x 6 min;       Ankle Exercises: Seated   Other Seated Ankle Exercises  INV/EV RTB x 20 each               PT Short Term Goals - 06/29/19 1051      PT SHORT TERM GOAL #1   Title  Pt to be independent with initial HEP    Time  2    Period  Weeks    Status  New    Target Date  07/12/19        PT Long Term Goals - 06/29/19 1051      PT LONG TERM GOAL #1   Title  Pt to be independent with final HEP    Time  6    Period  Weeks    Status  New    Target Date  08/09/19      PT LONG TERM GOAL #2   Title  Pt to report decreased pain in L achilles to 0-1/10 with standing, walking, and exercise, up to 1 hr.    Time  6    Period  Weeks    Status  New    Target Date  08/09/19      PT LONG TERM GOAL #3   Title  Pt to demo improved soft tissue limitations in L gastroc to be WNL, to reduce pain and improve DF.    Time  6    Period  Weeks    Status  New    Target Date  08/09/19      PT LONG TERM GOAL #4   Title  Pt to demo ability for stability of L foot/ankle to be WNL, for multi direction activity, to improve pain and ability for functional and community activitiy.    Time  6    Period  Weeks    Status  New    Target Date  08/09/19            Plan - 06/30/19 1615    Clinical Impression Statement  Pt with mild improved pain at distal achilles, more pain in PTT today. Much muscle tension and soreness in medial gastroc and PTT with DTM and IASTM. Pt to benefit from dry needling in future, not done today due to swelling from bee sting.     Examination-Activity Limitations  Squat;Stairs;Locomotion Level;Stand    Examination-Participation Restrictions  Community Activity;Shop;Yard Work    Stability/Clinical Decision Making  Stable/Uncomplicated    Rehab Potential  Good    PT Frequency  2x / week    PT Duration  6 weeks    PT Treatment/Interventions  ADLs/Self Care Home Management;Cryotherapy;Electrical Stimulation;Gait training;Parrafin;Ultrasound;Traction;Moist Heat;Iontophoresis 4mg /ml Dexamethasone;Stair training;Functional mobility training;Therapeutic activities;Therapeutic exercise;Balance training;Neuromuscular re-education;Manual techniques;Orthotic Fit/Training;Patient/family education;Passive range of motion;Dry needling;Taping;Joint Manipulations;Spinal Manipulations;Vasopneumatic Device    Consulted and Agree with Plan of Care  Patient       Patient will benefit from skilled therapeutic intervention in order to improve the following deficits and impairments:  Decreased range of motion, Increased muscle spasms, Decreased activity tolerance, Pain, Decreased mobility, Decreased strength  Visit Diagnosis: Pain in right ankle and joints of right foot     Problem List Patient Active Problem List   Diagnosis Date Noted  . Hyperglycemia 10/30/2018  . Hypertension 12/01/2017  . Hamstring sprain 03/19/2017  . Chronic seasonal allergic rhinitis due to pollen 02/12/2016  . History of prostate cancer s/p prostatectomy 2013 01/03/2014  . Bronchiectasis ([place]) 05/28/2013  . Polyneuropathy 02/17/2013  . Positive ANA (antinuclear antibody) 01/28/2013  . Lymphocytic colitis 03/31/2012  . Hyperlipidemia with target LDL less than 70 04/23/2007  . Exercise-induced bronchospasm 03/20/2006    Lyndee Hensen, PT, DPT 4:16 PM  06/30/19    [facility] [place], [place], [address] Phone: [phone]   Fax:  [phone]  Name: JAVON SNEE MRN: [id number] Date of  Birth: 30-Jul-1950

## 2019-07-05 ENCOUNTER — Encounter: Payer: PPO | Admitting: Physical Therapy

## 2019-07-07 ENCOUNTER — Encounter: Payer: Self-pay | Admitting: Physical Therapy

## 2019-07-07 ENCOUNTER — Ambulatory Visit: Payer: PPO | Admitting: Physical Therapy

## 2019-07-07 ENCOUNTER — Other Ambulatory Visit: Payer: Self-pay

## 2019-07-07 DIAGNOSIS — M25571 Pain in right ankle and joints of right foot: Secondary | ICD-10-CM

## 2019-07-07 NOTE — Therapy (Signed)
[facility] [address] [place], [place], [address] Phone: [phone]   Fax:  [phone]  Physical Therapy Treatment  Patient Details  Name: Dustin Burton MRN: [id number] Date of Birth: 1950-11-16 Referring Provider (PT): Lynne Leader   Encounter Date: 07/07/2019   PT End of Session - 07/07/19 1430    Visit Number 3    Number of Visits 12    Date for PT Re-Evaluation 08/09/19    Authorization Type HTA    PT Start Time 1426    PT Stop Time 1512    PT Time Calculation (min) 46 min    Activity Tolerance Patient tolerated treatment well    Behavior During Therapy [facility] for tasks assessed/performed           Past Medical History:  Diagnosis Date  . Brachial plexitis   . Elevated LFTs 07/2001  . Hemorrhoids   . Hypercholesteremia   . IBS (irritable bowel syndrome)   . Prostate cancer ([place])   . Seasonal allergies     Past Surgical History:  Procedure Laterality Date  . HAND SURGERY Left    index finger  . KNEE SURGERY Right    right  . PROSTATECTOMY      There were no vitals filed for this visit.   Subjective Assessment - 07/07/19 1428    Subjective Pt with improving pain in lower ankle/leg. Swelling much improved today from last visit/ after bee sting .Pt states sharper pains at times, with stepping onto foot/ball of foot.    Currently in Pain? Yes    Pain Score 2     Pain Location Ankle    Pain Orientation Left    Pain Descriptors / Indicators Aching    Pain Type Acute pain    Pain Onset More than a month ago    Pain Frequency Intermittent                             [facility] Adult PT Treatment/Exercise - 07/07/19 1431      Posture/Postural Control   Posture Comments Foot posture: mostly neutral, low/medium arch,       Exercises   Exercises Ankle      Modalities   Modalities Iontophoresis      Iontophoresis   Type of Iontophoresis Dexamethasone    Location L post tib, under navicular      Time 4 hr patch      Manual Therapy   Manual Therapy Soft tissue mobilization    Manual therapy comments skillled palpation and monitoring of soft tissue with dry needling.     Soft tissue mobilization DTM and IASTM to L medial gastroc , PTT, FHL      Ankle Exercises: Aerobic   Recumbent Bike L4 x 6 min;       Ankle Exercises: Stretches   Production manager Limitations --    Other Stretch self pl fascia stretch 30 sec x3 seated,  DF glides, kneeling at wall x 20 bil;       Ankle Exercises: Standing   SLS 30 sec x 3 bil; SLS with UE flex and Rot x 10 bil     Heel Raises 20 reps   shoes off     Ankle Exercises: Seated   Other Seated Ankle Exercises toe flexion RTB x 15 (pain)             Trigger Point  Dry Needling - 07/07/19 0001    Consent Given? Yes    Education Handout Provided Yes    Muscles Treated Lower Quadrant Gastrocnemius    Gastrocnemius Response Twitch response elicited;Palpable increased muscle length   L medial                  PT Short Term Goals - 06/29/19 1051      PT SHORT TERM GOAL #1   Title Pt to be independent with initial HEP    Time 2    Period Weeks    Status New    Target Date 07/12/19             PT Long Term Goals - 06/29/19 1051      PT LONG TERM GOAL #1   Title Pt to be independent with final HEP    Time 6    Period Weeks    Status New    Target Date 08/09/19      PT LONG TERM GOAL #2   Title Pt to report decreased pain in L achilles to 0-1/10 with standing, walking, and exercise, up to 1 hr.    Time 6    Period Weeks    Status New    Target Date 08/09/19      PT LONG TERM GOAL #3   Title Pt to demo improved soft tissue limitations in L gastroc to be WNL, to reduce pain and improve DF.    Time 6    Period Weeks    Status New    Target Date 08/09/19      PT LONG TERM GOAL #4   Title Pt to demo ability for stability of L foot/ankle to be WNL, for multi direction activity, to improve pain and  ability for functional and community activitiy.    Time 6    Period Weeks    Status New    Target Date 08/09/19                 Plan - 07/07/19 1526    Clinical Impression Statement Overall pt wiht less pain. Pt with decreased pain at distal achilles. More pain in PTT behind malleolus and into bottom of foot today. FHL also sore. Reviewed self stretching for PL fascia. DTM and Dry needling done for medial gastroc, pt with good tolerance. Plan to progress strength and stability as pain improves.    Examination-Activity Limitations Squat;Stairs;Locomotion Level;Stand    Examination-Participation Restrictions Community Activity;Shop;Yard Work    Stability/Clinical Decision Making Stable/Uncomplicated    Rehab Potential Good    PT Frequency 2x / week    PT Duration 6 weeks    PT Treatment/Interventions ADLs/Self Care Home Management;Cryotherapy;Electrical Stimulation;Gait training;Parrafin;Ultrasound;Traction;Moist Heat;Iontophoresis 4mg /ml Dexamethasone;Stair training;Functional mobility training;Therapeutic activities;Therapeutic exercise;Balance training;Neuromuscular re-education;Manual techniques;Orthotic Fit/Training;Patient/family education;Passive range of motion;Dry needling;Taping;Joint Manipulations;Spinal Manipulations;Vasopneumatic Device    Consulted and Agree with Plan of Care Patient           Patient will benefit from skilled therapeutic intervention in order to improve the following deficits and impairments:  Decreased range of motion, Increased muscle spasms, Decreased activity tolerance, Pain, Decreased mobility, Decreased strength  Visit Diagnosis: Pain in right ankle and joints of right foot     Problem List Patient Active Problem List   Diagnosis Date Noted  . Hyperglycemia 10/30/2018  . Hypertension 12/01/2017  . Hamstring sprain 03/19/2017  . Chronic seasonal allergic rhinitis due to pollen 02/12/2016  . History of prostate cancer s/p prostatectomy  2013  01/03/2014  . Bronchiectasis ([place]) 05/28/2013  . Polyneuropathy 02/17/2013  . Positive ANA (antinuclear antibody) 01/28/2013  . Lymphocytic colitis 03/31/2012  . Hyperlipidemia with target LDL less than 70 04/23/2007  . Exercise-induced bronchospasm 03/20/2006    Lyndee Hensen, PT, DPT 3:28 PM  07/07/19    [facility] [place], [place], [address] Phone: [phone]   Fax:  [phone]  Name: KRISTOPHER ATTWOOD MRN: [id number] Date of Birth: 04/25/1950

## 2019-07-12 ENCOUNTER — Encounter: Payer: Self-pay | Admitting: Physical Therapy

## 2019-07-12 ENCOUNTER — Ambulatory Visit: Payer: PPO | Admitting: Physical Therapy

## 2019-07-12 ENCOUNTER — Other Ambulatory Visit: Payer: Self-pay

## 2019-07-12 DIAGNOSIS — M25571 Pain in right ankle and joints of right foot: Secondary | ICD-10-CM | POA: Diagnosis not present

## 2019-07-13 NOTE — Therapy (Addendum)
[facility] [address] [place], [place], [address] Phone: [phone]   Fax:  [phone]  Physical Therapy Treatment  Patient Details  Name: Dustin Burton MRN: [id number] Date of Birth: 27-Nov-1950 Referring Provider (PT): Lynne Leader   Encounter Date: 07/12/2019   PT End of Session - 07/12/19 1443     Visit Number 4    Number of Visits 12    Date for PT Re-Evaluation 08/09/19    Authorization Type HTA    PT Start Time 1437    PT Stop Time 1529    PT Time Calculation (min) 52 min    Activity Tolerance Patient tolerated treatment well    Behavior During Therapy [facility] for tasks assessed/performed             Past Medical History:  Diagnosis Date   Brachial plexitis    Elevated LFTs 07/2001   Hemorrhoids    Hypercholesteremia    IBS (irritable bowel syndrome)    Prostate cancer ([place])    Seasonal allergies     Past Surgical History:  Procedure Laterality Date   HAND SURGERY Left    index finger   KNEE SURGERY Right    right   PROSTATECTOMY      There were no vitals filed for this visit.   Subjective Assessment - 07/12/19 1442     Subjective Pt with slightly more soreness in last few days, in bottom of foot/ankle. Calf has been sore since last visit. Was able to ride bike and walk with less pain.    Currently in Pain? Yes    Pain Location Ankle    Pain Orientation Left    Pain Descriptors / Indicators Aching    Pain Type Acute pain    Pain Onset More than a month ago    Pain Frequency Intermittent                               [facility] Adult PT Treatment/Exercise - 07/13/19 0001       Ankle Exercises: Stretches   Gastroc Stretch 3 reps;30 seconds    Gastroc Stretch Limitations at wall                       PT Short Term Goals - 06/29/19 1051       PT SHORT TERM GOAL #1   Title Pt to be independent with initial HEP    Time 2    Period Weeks    Status New    Target Date  07/12/19               PT Long Term Goals - 06/29/19 1051       PT LONG TERM GOAL #1   Title Pt to be independent with final HEP    Time 6    Period Weeks    Status New    Target Date 08/09/19      PT LONG TERM GOAL #2   Title Pt to report decreased pain in L achilles to 0-1/10 with standing, walking, and exercise, up to 1 hr.    Time 6    Period Weeks    Status New    Target Date 08/09/19      PT LONG TERM GOAL #3   Title Pt to demo improved soft tissue limitations in L gastroc to be WNL, to reduce pain and improve DF.  Time 6    Period Weeks    Status New    Target Date 08/09/19      PT LONG TERM GOAL #4   Title Pt to demo ability for stability of L foot/ankle to be WNL, for multi direction activity, to improve pain and ability for functional and community activitiy.    Time 6    Period Weeks    Status New    Target Date 08/09/19                   Plan - 07/13/19 0809     Clinical Impression Statement Pt with improved pain in PTT and at bottom of foot today with palpation and with ther ex. Increased soreness in gastroc today, DTM/TPR, as well as E-Stim and heat done for pain. Will continue to work on calf and most painful areas for pain relief. Function, activity and weight bearing activity improving.    Examination-Activity Limitations Squat;Stairs;Locomotion Level;Stand    Examination-Participation Restrictions Community Activity;Shop;Yard Work    Stability/Clinical Decision Making Stable/Uncomplicated    Rehab Potential Good    PT Frequency 2x / week    PT Duration 6 weeks    PT Treatment/Interventions ADLs/Self Care Home Management;Cryotherapy;Electrical Stimulation;Gait training;Parrafin;Ultrasound;Traction;Moist Heat;Iontophoresis 29m/ml Dexamethasone;Stair training;Functional mobility training;Therapeutic activities;Therapeutic exercise;Balance training;Neuromuscular re-education;Manual techniques;Orthotic Fit/Training;Patient/family  education;Passive range of motion;Dry needling;Taping;Joint Manipulations;Spinal Manipulations;Vasopneumatic Device    Consulted and Agree with Plan of Care Patient             Patient will benefit from skilled therapeutic intervention in order to improve the following deficits and impairments:  Decreased range of motion, Increased muscle spasms, Decreased activity tolerance, Pain, Decreased mobility, Decreased strength  Visit Diagnosis: Pain in right ankle and joints of right foot     Problem List Patient Active Problem List   Diagnosis Date Noted   Hyperglycemia 10/30/2018   Hypertension 12/01/2017   Hamstring sprain 03/19/2017   Chronic seasonal allergic rhinitis due to pollen 02/12/2016   History of prostate cancer s/p prostatectomy 2013 01/03/2014   [place] 05/28/2013   Polyneuropathy 02/17/2013   Positive ANA (antinuclear antibody) 01/28/2013   Lymphocytic colitis 03/31/2012   Hyperlipidemia with target LDL less than 70 04/23/2007   Exercise-induced bronchospasm 03/20/2006    LLyndee Hensen PT, DPT 8:14 AM  07/13/19    [facility] [address]Phone: [phone]  Fax:  [phone] Name: Dustin DOLATAMRN: [id number] of Birth: [birth year] PHYSICAL THERAPY DISCHARGE SUMMARY  Visits from Start of Care: 4 Plan: Patient agrees to discharge.  Patient goals were partially met. Patient is being discharged due to -not returning since last visit.       LLyndee Hensen PT, DPT 11:36 AM  10/22/21

## 2019-07-21 MED FILL — ATORVASTATIN CALCIUM 40 MG: 40 | 90 days supply | Qty: 90 | Fill #2

## 2019-07-28 ENCOUNTER — Encounter: Payer: PPO | Admitting: Physical Therapy

## 2019-08-04 ENCOUNTER — Ambulatory Visit: Payer: PPO | Admitting: Family Medicine

## 2019-08-14 DIAGNOSIS — Z20822 Contact with and (suspected) exposure to covid-19: Secondary | ICD-10-CM | POA: Diagnosis not present

## 2019-09-20 ENCOUNTER — Telehealth: Payer: Self-pay | Admitting: Family Medicine

## 2019-09-20 ENCOUNTER — Other Ambulatory Visit: Payer: Self-pay | Admitting: *Deleted

## 2019-09-20 DIAGNOSIS — Z0000 Encounter for general adult medical examination without abnormal findings: Secondary | ICD-10-CM

## 2019-09-20 MED FILL — EZETIMIBE 10 MG TABS: 10 | 90 days supply | Qty: 45 | Fill #1

## 2019-09-20 NOTE — Telephone Encounter (Signed)
That is ok.  Ok to order same labs as last year OR he can come to his visit non fasting and we can draw labs then.  Algis Greenhouse. Jerline Pain, MD 09/20/2019 10:08 AM

## 2019-09-20 NOTE — Telephone Encounter (Signed)
Patient is calling in asking for an appointment for physical,scheduled for 10/29/19 at 1:20 but is not comfortable fasting this long. Wanted to know if he can get labs done a few days prior.

## 2019-09-20 NOTE — Telephone Encounter (Signed)
LVM lab order placed, please give [place] a call a few days prior to physical or early on 10/29/2019 for labs

## 2019-09-20 NOTE — Telephone Encounter (Signed)
Please advise if ok ?

## 2019-09-23 DIAGNOSIS — Z85828 Personal history of other malignant neoplasm of skin: Secondary | ICD-10-CM | POA: Diagnosis not present

## 2019-09-23 DIAGNOSIS — L821 Other seborrheic keratosis: Secondary | ICD-10-CM | POA: Diagnosis not present

## 2019-10-21 ENCOUNTER — Other Ambulatory Visit: Payer: PPO

## 2019-10-21 ENCOUNTER — Other Ambulatory Visit: Payer: Self-pay

## 2019-10-21 DIAGNOSIS — Z0000 Encounter for general adult medical examination without abnormal findings: Secondary | ICD-10-CM | POA: Diagnosis not present

## 2019-10-21 MED FILL — ATORVASTATIN 40 MG TABLET: 40 | 90 days supply | Qty: 90 | Fill #3

## 2019-10-22 LAB — COMPREHENSIVE METABOLIC PANEL
AG Ratio: 1.8 (calc) (ref 1.0–2.5)
ALT: 27 U/L (ref 9–46)
AST: 31 U/L (ref 10–35)
Albumin: 4.5 g/dL (ref 3.6–5.1)
Alkaline phosphatase (APISO): 71 U/L (ref 35–144)
BUN: 16 mg/dL (ref 7–25)
CO2: 28 mmol/L (ref 20–32)
Calcium: 9.6 mg/dL (ref 8.6–10.3)
Chloride: 105 mmol/L (ref 98–110)
Creat: 0.92 mg/dL (ref 0.70–1.25)
Globulin: 2.5 g/dL (calc) (ref 1.9–3.7)
Glucose, Bld: 87 mg/dL (ref 65–99)
Potassium: 3.9 mmol/L (ref 3.5–5.3)
Sodium: 142 mmol/L (ref 135–146)
Total Bilirubin: 0.6 mg/dL (ref 0.2–1.2)
Total Protein: 7 g/dL (ref 6.1–8.1)

## 2019-10-22 LAB — CBC
HCT: 43.1 % (ref 38.5–50.0)
Hemoglobin: 14.4 g/dL (ref 13.2–17.1)
MCH: 32.4 pg (ref 27.0–33.0)
MCHC: 33.4 g/dL (ref 32.0–36.0)
MCV: 96.9 fL (ref 80.0–100.0)
MPV: 10.5 fL (ref 7.5–12.5)
Platelets: 287 10*3/uL (ref 140–400)
RBC: 4.45 10*6/uL (ref 4.20–5.80)
RDW: 12.7 % (ref 11.0–15.0)
WBC: 5.2 10*3/uL (ref 3.8–10.8)

## 2019-10-22 LAB — LIPID PANEL
Cholesterol: 129 mg/dL (ref <200)
HDL: 56 mg/dL (ref >=40)
LDL Cholesterol (Calc): 59 mg/dL (calc)
Non-HDL Cholesterol (Calc): 73 mg/dL (calc) (ref <130)
Total CHOL/HDL Ratio: 2.3 (calc) (ref <5.0)
Triglycerides: 55 mg/dL (ref <150)

## 2019-10-22 LAB — TSH: TSH: 1.55 mIU/L (ref 0.40–4.50)

## 2019-10-22 LAB — PSA: PSA: <0.04 ng/mL (ref <=4.0)

## 2019-10-22 NOTE — Progress Notes (Signed)
Please inform patient of the following:  Labs are all NORMAL - we can discuss more at his upcoming CPE.  Algis Greenhouse. Jerline Pain, MD 10/22/2019 8:46 AM

## 2019-10-29 ENCOUNTER — Encounter: Payer: Self-pay | Admitting: Family Medicine

## 2019-11-05 DIAGNOSIS — M79671 Pain in right foot: Secondary | ICD-10-CM | POA: Diagnosis not present

## 2019-11-05 DIAGNOSIS — S91311A Laceration without foreign body, right foot, initial encounter: Secondary | ICD-10-CM | POA: Diagnosis not present

## 2019-12-07 ENCOUNTER — Encounter: Payer: Self-pay | Admitting: Family Medicine

## 2019-12-07 NOTE — Telephone Encounter (Signed)
Left message to return call to our office at their convenience.  

## 2019-12-08 NOTE — Telephone Encounter (Signed)
Called patient, patient stated wound is healing, looking a better now  Have a F/U next month with Dr Jerline Pain  Advise if wound not getting better to go to UC. Patient verbalized understanding

## 2019-12-10 DIAGNOSIS — L03115 Cellulitis of right lower limb: Secondary | ICD-10-CM | POA: Diagnosis not present

## 2019-12-21 ENCOUNTER — Other Ambulatory Visit: Payer: Self-pay

## 2019-12-21 ENCOUNTER — Telehealth (INDEPENDENT_AMBULATORY_CARE_PROVIDER_SITE_OTHER): Payer: PPO | Admitting: Internal Medicine

## 2019-12-21 DIAGNOSIS — S99821A Other specified injuries of right foot, initial encounter: Secondary | ICD-10-CM

## 2019-12-21 DIAGNOSIS — S81801A Unspecified open wound, right lower leg, initial encounter: Secondary | ICD-10-CM

## 2019-12-21 DIAGNOSIS — X58XXXA Exposure to other specified factors, initial encounter: Secondary | ICD-10-CM | POA: Diagnosis not present

## 2019-12-21 DIAGNOSIS — M86171 Other acute osteomyelitis, right ankle and foot: Secondary | ICD-10-CM | POA: Diagnosis not present

## 2019-12-21 NOTE — Progress Notes (Signed)
Virtual Visit via Video Note  I connected with Dustin Burton on 12/23/19 at  1:45 PM EST by a video enabled telemedicine application and verified that I am speaking with the correct person using two identifiers.  Location: Patient: at home in [place] Provider: in clinic   I discussed the limitations of evaluation and management by telemedicine and the availability of in person appointments. The patient expressed understanding and agreed to proceed.  History of Present Illness:  Oct 15th. He reports that he was [place] in the [place] and sustained cuts to several places on right foot, specifically Big toe, arch   On the day of injury. Started on doxycycline through yrgent care. Course of abtx- for 10 day. Wound appeared to be healing, loose flap falling off, good wound bed. Until early November then started to notice Increased erythema, and necrosis of skin, serous fluid drainage. Antibiotic cream stayed okay  Pain on right leg, but also ankle swollen,  --- no fever, cellulitis - on bactrim and keflex for 5 days ( no change in redness, but swelling slightly improved in the morning). Pain improved to just localized to ankle. Swelling reaccumulates throughout the day   Last week, he was changed to levaquin (last dose today) plus bactrim no improvement- small amount of drainage.(stopped bactrim 2 days ago)  Right foot swelling, tenderness about ankle bone, and ambulation, spares weight bearing due to pain  No fever No history of gout Hx of right sprained ankle (nothing recent within the last 5 years) No surgeries ankle  He has access to some of the local medical clinics/labs: [facility] close to his home Wound care center 2 hrs [place] [facility]   Observations/Objective:   through video, right ankle/foot is swollen in comparison to left foot. There is still some blanching erythema. There appears to be macerated edges to the wound on the arch of the left  foot  There is no purulent drainage. Assessment and Plan: A/p: soft tissue injury to right foot, salt water exposure, roughly 6 wks ago, not completely healed despite several course of oral abtx. Now also noted to have right ankle swelling. No prior injury to right ankle. Concern for abscess vs. Septic arthritis vs. Early osteomyelitis  - plan to get cbc with diff, bmp, sed rate and crp through [facility] - see how to arrange for mri with contrast of right foot/ankle - watch off of abtx to see if any worsening of process - to send [place] photos Q3-4 days to see how it evolves  - may just need local wound care, but would like to rule out septic arthritis  Follow Up Instructions:    I discussed the assessment and treatment plan with the patient. The patient was provided an opportunity to ask questions and all were answered. The patient agreed with the plan and demonstrated an understanding of the instructions.   The patient was advised to call back or seek an in-person evaluation if the symptoms worsen or if the condition fails to improve as anticipated.  I provided 30  minutes of non-face-to-face time during this encounter.   Carlyle Basques, MD   ---------------------

## 2019-12-22 ENCOUNTER — Other Ambulatory Visit: Payer: Self-pay

## 2019-12-22 ENCOUNTER — Telehealth: Payer: Self-pay | Admitting: *Deleted

## 2019-12-22 ENCOUNTER — Ambulatory Visit: Payer: PPO | Admitting: Physician Assistant

## 2019-12-22 NOTE — Telephone Encounter (Signed)
Patient left message in Triage, stating that after discussing costs with insurance, he would prefer to have MRI in [place], preferably 12/8. He is still having labs drawn in [place], [place] while he is there. Will forward to [facility] for assistance. Landis Gandy, RN

## 2019-12-22 NOTE — Telephone Encounter (Signed)
Orders for patient's labs have been faxed to [facility] in [place], [place]. Fax # [phone]. Patient is aware that lab orders have been faxed and has schedule with [facility] to have them drawn. Dustin Burton T Brooks Sailors

## 2019-12-22 NOTE — Progress Notes (Signed)
error 

## 2019-12-23 DIAGNOSIS — S91331A Puncture wound without foreign body, right foot, initial encounter: Secondary | ICD-10-CM | POA: Diagnosis not present

## 2019-12-27 NOTE — Telephone Encounter (Signed)
Left message on self-identified voicemail requesting patient to call back so we can schedule him for an appointment to see Dr. Baxter Flattery on Thursday before his MRI.   Beryle Flock, RN

## 2019-12-27 NOTE — Telephone Encounter (Signed)
Patient called back, scheduled for MyChart virtual visit tomorrow 12/7. Patient working on getting the MRI moved up to Wednesday. RN advised patient to seek evaluation at urgent care if he is concerned about waiting to be seen. Patient verbalized understanding and has no further questions.   Beryle Flock, RN

## 2019-12-27 NOTE — Telephone Encounter (Signed)
Patient got MRI moved up to 12/28/19, rescheduled patient with Dr. Baxter Flattery for 12/30/19.   Beryle Flock, RN

## 2019-12-28 ENCOUNTER — Ambulatory Visit (HOSPITAL_COMMUNITY)
Admission: RE | Admit: 2019-12-28 | Discharge: 2019-12-28 | Disposition: A | Discharge status: Home / Self Care | Payer: PPO | Source: Ambulatory Visit | Attending: Internal Medicine | Admitting: Internal Medicine

## 2019-12-28 ENCOUNTER — Telehealth: Payer: PPO | Admitting: Internal Medicine

## 2019-12-28 DIAGNOSIS — M86171 Other acute osteomyelitis, right ankle and foot: Secondary | ICD-10-CM | POA: Diagnosis not present

## 2019-12-28 DIAGNOSIS — M19071 Primary osteoarthritis, right ankle and foot: Secondary | ICD-10-CM | POA: Diagnosis not present

## 2019-12-28 MED ORDER — GADOBUTROL 1 MMOL/ML IV SOLN
7.5000 mL | Freq: Once | INTRAVENOUS | Status: AC | PRN
  Administered 2019-12-28: 7.5 mL via INTRAVENOUS

## 2019-12-29 MED FILL — EZETIMIBE 10 MG TABS: 10 | 90 days supply | Qty: 45 | Fill #2

## 2019-12-29 NOTE — Telephone Encounter (Signed)
Called patient to confirm in person visit tomorrow (12/9). Let patient know we did receive his labs. Labs in Dr. Storm Frisk box for review.   Beryle Flock, RN

## 2019-12-30 ENCOUNTER — Ambulatory Visit (HOSPITAL_COMMUNITY): Admission: RE | Admit: 2019-12-30 | Payer: PPO | Source: Ambulatory Visit

## 2019-12-30 ENCOUNTER — Encounter: Payer: Self-pay | Admitting: Family Medicine

## 2019-12-30 ENCOUNTER — Ambulatory Visit: Payer: PPO | Admitting: Internal Medicine

## 2019-12-30 ENCOUNTER — Encounter (HOSPITAL_COMMUNITY): Payer: Self-pay

## 2019-12-30 ENCOUNTER — Other Ambulatory Visit: Payer: Self-pay

## 2019-12-30 DIAGNOSIS — S81801A Unspecified open wound, right lower leg, initial encounter: Secondary | ICD-10-CM | POA: Diagnosis not present

## 2019-12-30 NOTE — Progress Notes (Signed)
RFV: follow up for right foot wound Patient ID: Dustin Burton, male   DOB: August 17, 1950, 69 y.o.   MRN: [id number]  HPI  Friend is a 78yo M in good states of health, he sustained wound to right foot in mid October that has received several courses of oral abtx and has been slow to heal. We did phone interview on 11/30, and at that time asked to stop taking further antibiotics and watch off of therapy. HE states that it has remained mostly Dry but redness by medial malleolus still remains. Appears smaller than a few weeks ago but somewhat tender in the area."deeper red" no fluctuant, not oozing in the last 5 days. 3 wks played tennis didn't hurt. No fever. He had labs drawn last week that did not show any leukocytosis or left shift on differential. Inflammatory markers were WNL per my review. Due to discomfort at ankle and concern for septic arthritis, he underwent MRI and ankle and foot to ensure no deep seeded infection, abscess, -- imaging reassuring. Reviewed imaging with patient. Mri suggests superficial venous thrombosis near midfoot  Outpatient Encounter Medications as of 12/30/2019  Medication Sig  . amLODipine (NORVASC) 2.5 MG tablet Take 1 tablet (2.5 mg total) by mouth daily. (Patient not taking: Reported on 06/23/2019)  . atorvastatin (LIPITOR) 40 MG tablet Take 1 tablet (40 mg total) by mouth daily.  [name] ezetimibe (ZETIA) 10 MG tablet Take 0.5 tablets (5 mg total) by mouth daily.  . fluticasone (FLONASE) 50 MCG/ACT nasal spray PLACE 2 SPRAYS INTO BOTH NOSTRILS DAILY.  . montelukast (SINGULAIR) 10 MG tablet TAKE 1 TABLET (10 MG TOTAL) BY MOUTH AT BEDTIME.   No facility-administered encounter medications on file as of 12/30/2019.     Patient Active Problem List   Diagnosis Date Noted  . Hyperglycemia 10/30/2018  . Hypertension 12/01/2017  . Hamstring sprain 03/19/2017  . Chronic seasonal allergic rhinitis due to pollen 02/12/2016  . History of prostate cancer s/p prostatectomy 2013  01/03/2014  . Bronchiectasis ([place]) 05/28/2013  . Polyneuropathy 02/17/2013  . Positive ANA (antinuclear antibody) 01/28/2013  . Lymphocytic colitis 03/31/2012  . Hyperlipidemia with target LDL less than 70 04/23/2007  . Exercise-induced bronchospasm 03/20/2006     Health Maintenance Due  Topic Date Due  . COVID-19 Vaccine (1) Never done  . PNA vac Low Risk Adult (2 of 2 - PPSV23) 09/04/2018  . INFLUENZA VACCINE  08/22/2019     Review of Systems No fever, chills, no drainage from wound. Still some discomfort Physical Exam   There were no vitals taken for this visit. Skin= right foot flaking at edges, dry small eschar not oozing. No fluctuance Ext= erythema slight blanching, near medial malleolus, no fluctuance, mild tenderness - corresponds to area abn on mri  Lab Results  Component Value Date   LABRPR NON REAC 01/26/2013    CBC Lab Results  Component Value Date   WBC 5.2 10/21/2019   RBC 4.45 10/21/2019   HGB 14.4 10/21/2019   HCT 43.1 10/21/2019   PLT 287 10/21/2019   MCV 96.9 10/21/2019   MCH 32.4 10/21/2019   MCHC 33.4 10/21/2019   RDW 12.7 10/21/2019   LYMPHSABS 1.1 03/19/2017   MONOABS 1.0 03/19/2017   EOSABS 0.1 03/19/2017    BMET Lab Results  Component Value Date   NA 142 10/21/2019   K 3.9 10/21/2019   CL 105 10/21/2019   CO2 28 10/21/2019   GLUCOSE 87 10/21/2019   BUN 16 10/21/2019  CREATININE 0.92 10/21/2019   CALCIUM 9.6 10/21/2019   GFRNONAA >60 01/15/2010   GFRAA  01/15/2010    >60        The eGFR has been calculated using the MDRD equation. This calculation has not been validated in all clinical situations. eGFR's persistently <60 mL/min signify possible Chronic Kidney Disease.      Assessment and Plan Superficial thrombosis = often self-limited, resolves on its own. He reports the area being less tender.  Will try aspirin 382m x 7 days.   Continue Observe off abtx   Slowly healing wound = can apply eucerin lotion to dry  aspects to minimize further cracking of area.  Ankle swelling = now resolved. Imaging reassuring for excluding septic arthritis.

## 2019-12-30 NOTE — Patient Instructions (Signed)
Pick up over the counter aspirin 325mg   X 7 days , take on full stomach  eucerin or vaseline - to dry areas daily   Call if it worsens

## 2020-05-22 ENCOUNTER — Other Ambulatory Visit: Payer: Self-pay

## 2020-05-22 ENCOUNTER — Ambulatory Visit (INDEPENDENT_AMBULATORY_CARE_PROVIDER_SITE_OTHER): Payer: Medicare Other | Admitting: Family Medicine

## 2020-05-22 ENCOUNTER — Encounter: Payer: Self-pay | Admitting: Family Medicine

## 2020-05-22 VITALS — BP 142/68 | HR 74 | Temp 98.0°F | Ht 70.5 in | Wt 166.2 lb

## 2020-05-22 DIAGNOSIS — E785 Hyperlipidemia, unspecified: Secondary | ICD-10-CM

## 2020-05-22 DIAGNOSIS — I10 Essential (primary) hypertension: Secondary | ICD-10-CM

## 2020-05-22 DIAGNOSIS — Z0001 Encounter for general adult medical examination with abnormal findings: Secondary | ICD-10-CM | POA: Diagnosis not present

## 2020-05-22 DIAGNOSIS — Z0000 Encounter for general adult medical examination without abnormal findings: Secondary | ICD-10-CM

## 2020-05-22 DIAGNOSIS — Z8546 Personal history of malignant neoplasm of prostate: Secondary | ICD-10-CM

## 2020-05-22 DIAGNOSIS — J301 Allergic rhinitis due to pollen: Secondary | ICD-10-CM

## 2020-05-22 DIAGNOSIS — J45990 Exercise induced bronchospasm: Secondary | ICD-10-CM

## 2020-05-22 DIAGNOSIS — R739 Hyperglycemia, unspecified: Secondary | ICD-10-CM

## 2020-05-22 MED ORDER — MONTELUKAST SODIUM 10 MG PO TABS: 10.0000 mg | ORAL_TABLET | Freq: Every day | ORAL | 3 refills | Status: DC

## 2020-05-22 MED ORDER — EZETIMIBE 10 MG PO TABS: ORAL_TABLET | ORAL | 3 refills | Status: DC

## 2020-05-22 MED ORDER — ATORVASTATIN CALCIUM 40 MG PO TABS: 40.0000 mg | ORAL_TABLET | Freq: Every day | ORAL | 3 refills | Status: DC

## 2020-05-22 NOTE — Assessment & Plan Note (Signed)
Last PSA undetectable.  Continue annual PSA check.

## 2020-05-22 NOTE — Assessment & Plan Note (Signed)
Singulair refilled.  Symptoms are stable.

## 2020-05-22 NOTE — Patient Instructions (Signed)
It was very nice to see you today!  I will refill your medications today.  Please get your colonoscopy soon.  You will be due for your next checkup in about a year.  Take care, Dr Jerline Pain  PLEASE NOTE:  If you had any lab tests please let us know if you have not heard back within a few days. You may see your results on mychart before we have a chance to review them but we will give you a call once they are reviewed by [place]. If we ordered any referrals today, please let us know if you have not heard from their office within the next week.   Please try these tips to maintain a healthy lifestyle:   Eat at least 3 REAL meals and 1-2 snacks per day.  Aim for no more than 5 hours between eating.  If you eat breakfast, please do so within one hour of getting up.    Each meal should contain half fruits/vegetables, one quarter protein, and one quarter carbs (no bigger than a computer mouse)   Cut down on sweet beverages. This includes juice, soda, and sweet tea.     Drink at least 1 glass of water with each meal and aim for at least 8 glasses per day   Exercise at least 150 minutes every week.    Preventive Care 70 Years and Older, Male Preventive care refers to lifestyle choices and visits with your health care provider that can promote health and wellness. This includes:  A yearly physical exam. This is also called an annual wellness visit.  Regular dental and eye exams.  Immunizations.  Screening for certain conditions.  Healthy lifestyle choices, such as: ? Eating a healthy diet. ? Getting regular exercise. ? Not using drugs or products that contain nicotine and tobacco. ? Limiting alcohol use. What can I expect for my preventive care visit? Physical exam Your health care provider will check your:  Height and weight. These may be used to calculate your BMI (body mass index). BMI is a measurement that tells if you are at a healthy weight.  Heart rate and blood  pressure.  Body temperature.  Skin for abnormal spots. Counseling Your health care provider may ask you questions about your:  Past medical problems.  Family's medical history.  Alcohol, tobacco, and drug use.  Emotional well-being.  Home life and relationship well-being.  Sexual activity.  Diet, exercise, and sleep habits.  History of falls.  Memory and ability to understand (cognition).  Work and work Statistician.  Access to firearms. What immunizations do I need? Vaccines are usually given at various ages, according to a schedule. Your health care provider will recommend vaccines for you based on your age, medical history, and lifestyle or other factors, such as travel or where you work.   What tests do I need? Blood tests  Lipid and cholesterol levels. These may be checked every 5 years, or more often depending on your overall health.  Hepatitis C test.  Hepatitis B test. Screening  Lung cancer screening. You may have this screening every year starting at age 70 if you have a 30-pack-year history of smoking and currently smoke or have quit within the past 15 years.  Colorectal cancer screening. ? All adults should have this screening starting at age 70 and continuing until age 52. ? Your health care provider may recommend screening at age 70 if you are at increased risk. ? You will have tests every 1-10 years,  depending on your results and the type of screening test.  Prostate cancer screening. Recommendations will vary depending on your family history and other risks.  Genital exam to check for testicular cancer or hernias.  Diabetes screening. ? This is done by checking your blood sugar (glucose) after you have not eaten for a while (fasting). ? You may have this done every 1-3 years.  Abdominal aortic aneurysm (AAA) screening. You may need this if you are a current or former smoker.  STD (sexually transmitted disease) testing, if you are at  risk. Follow these instructions at home: Eating and drinking  Eat a diet that includes fresh fruits and vegetables, whole grains, lean protein, and low-fat dairy products. Limit your intake of foods with high amounts of sugar, saturated fats, and salt.  Take vitamin and mineral supplements as recommended by your health care provider.  Do not drink alcohol if your health care provider tells you not to drink.  If you drink alcohol: ? Limit how much you have to 0-2 drinks a day. ? Be aware of how much alcohol is in your drink. In the [place] one drink equals one 12 oz bottle of beer (355 mL), one 5 oz glass of wine (148 mL), or one 1 oz glass of hard liquor (44 mL).   Lifestyle  Take daily care of your teeth and gums. Brush your teeth every morning and night with fluoride toothpaste. Floss one time each day.  Stay active. Exercise for at least 30 minutes 5 or more days each week.  Do not use any products that contain nicotine or tobacco, such as cigarettes, e-cigarettes, and chewing tobacco. If you need help quitting, ask your health care provider.  Do not use drugs.  If you are sexually active, practice safe sex. Use a condom or other form of protection to prevent STIs (sexually transmitted infections).  Talk with your health care provider about taking a low-dose aspirin or statin.  Find healthy ways to cope with stress, such as: ? Meditation, yoga, or listening to music. ? Journaling. ? Talking to a trusted person. ? Spending time with friends and family. Safety  Always wear your seat belt while driving or riding in a vehicle.  Do not drive: ? If you have been drinking alcohol. Do not ride with someone who has been drinking. ? When you are tired or distracted. ? While texting.  Wear a helmet and other protective equipment during sports activities.  If you have firearms in your house, make sure you follow all gun safety procedures. What's next?  Visit your health care  provider once a year for an annual wellness visit.  Ask your health care provider how often you should have your eyes and teeth checked.  Stay up to date on all vaccines. This information is not intended to replace advice given to you by your health care provider. Make sure you discuss any questions you have with your health care provider. Document Revised: 10/06/2018 Document Reviewed: 01/01/2018 Elsevier Patient Education  2021 [employer].

## 2020-05-22 NOTE — Progress Notes (Signed)
Chief Complaint:  Dustin Burton is a 70 y.o. male who presents today for a subsequent Medicare Annual Wellness Visit and to discuss management of his chronic medical problems.  Assessment/Plan:  Chronic Problems Addressed Today: Hyperlipidemia with target LDL less than 70 Last lipid panel at goal.  Continue atorvastatin 40 mg daily and Zetia 5 mg daily.  Continue lifestyle modifications.  Hypertension At goal per JNC 8.  Continue lifestyle modifications.  Chronic seasonal allergic rhinitis due to pollen Singulair refilled.  Symptoms are stable.  History of prostate cancer s/p prostatectomy 2013 Last PSA undetectable.  Continue annual PSA check.  Preventative Healthcare He has received COVID vaccines and flu vaccine.  PSA last year was normal.  He will be due for colonoscopy later this year.  Due for next seasonal flu vaccine later this year as well.  Up-to-date on pneumonia vaccines.  During the course of the visit the patient was educated and counseled about appropriate screening and preventive services including:        Fall prevention   Nutrition Physical Activity Weight Management Cognition    Subjective:  HPI:  Health Risk Assessment: Patient considers his overall health to be good. He has no difficulty performing the following: . Preparing food and eating . Bathing  . Getting dressed . Using the toilet . Shopping . Managing Finances . Moving around from place to place  He has not had any falls within the past year.   Depression screen [facility] 2/9 05/22/2020  Decreased Interest 0  Down, Depressed, Hopeless 0  PHQ - 2 Score 0  Altered sleeping -  Tired, decreased energy -  Change in appetite -  Feeling bad or failure about yourself  -  Trouble concentrating -  Moving slowly or fidgety/restless -  Suicidal thoughts -  PHQ-9 Score -  Difficult doing work/chores -  Some recent data might be hidden   Lifestyle Factors: Diet: Balanced.  Exercise: likes to  cycle and play tennis.   Patient Care Team: Vivi Barrack, MD as PCP - General (Family Medicine) Fay Records, MD as PCP - Cardiology (Cardiology)   He has no acute complaints today.   ROS: Per HPI, otherwise a complete review of systems was negative.   PMH:  The following were reviewed and entered/updated in epic: Past Medical History:  Diagnosis Date  . Brachial plexitis   . Elevated LFTs 07/2001  . Hemorrhoids   . Hypercholesteremia   . IBS (irritable bowel syndrome)   . Prostate cancer ([place])   . Seasonal allergies    Patient Active Problem List   Diagnosis Date Noted  . Hyperglycemia 10/30/2018  . Hypertension 12/01/2017  . Chronic seasonal allergic rhinitis due to pollen 02/12/2016  . History of prostate cancer s/p prostatectomy 2013 01/03/2014  . Bronchiectasis ([place]) 05/28/2013  . Polyneuropathy 02/17/2013  . Positive ANA (antinuclear antibody) 01/28/2013  . Lymphocytic colitis 03/31/2012  . Hyperlipidemia with target LDL less than 70 04/23/2007   Past Surgical History:  Procedure Laterality Date  . HAND SURGERY Left    index finger  . KNEE SURGERY Right    right  . PROSTATECTOMY      Family History  Problem Relation Age of Onset  . Hypertension Mother   . Coronary artery disease Mother        PCI  . Crohn's disease Mother   . Hyperlipidemia Mother   . Hypertension Father        carotid endarterectomy  . Colon  cancer Father   . Crohn's disease Father   . Hyperlipidemia Father   . Alzheimer's disease Father   . Cystic fibrosis Other        niece    Medications- reviewed and updated Current Outpatient Medications  Medication Sig Dispense Refill  . atorvastatin (LIPITOR) 40 MG tablet Take 1 tablet (40 mg total) by mouth daily. 90 tablet 3  . ezetimibe (ZETIA) 10 MG tablet TAKE 0.5 TABLETS (5 MG TOTAL) BY MOUTH DAILY. 45 tablet 3  . montelukast (SINGULAIR) 10 MG tablet Take 1 tablet (10 mg total) by mouth at bedtime. 90 tablet 3   No current  facility-administered medications for this visit.    Allergies-reviewed and updated Allergies  Allergen Reactions  . Tadalafil Other (See Comments)    Muscular leg pain    Social History   Socioeconomic History  . Marital status: Married    Spouse name: Not on file  . Number of children: Not on file  . Years of education: Not on file  . Highest education level: Not on file  Occupational History  . 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 Smoking status: Never Smoker  . Smokeless tobacco: Never Used  Vaping Use  . Vaping Use: Never used  Substance and Sexual Activity  . Alcohol use: Yes    Comment: socially  . Drug use: No  . Sexual activity: Not on file  Other Topics Concern  . Not on file  Social History Narrative  . Not on file   Social Determinants of Health   Financial Resource Strain: Not on file  Food Insecurity: Not on file  Transportation Needs: Not on file  Physical Activity: Not on file  Stress: Not on file  Social Connections: Not on file         Objective/Observations  Physical Exam: BP (!) 142/68   Pulse 74   Temp 98 F (36.7 C)   Ht 5' 10.5" (1.791 m)   Wt 166 lb 4 oz (75.4 kg)   SpO2 97%   BMI 23.52 kg/m   Wt Readings from Last 3 Encounters:  05/22/20 166 lb 4 oz (75.4 kg)  06/23/19 165 lb 9.6 oz (75.1 kg)  05/06/19 168 lb 12.8 oz (76.6 kg)   Gen: NAD, resting comfortably HEENT: TMs normal bilaterally. OP clear. No thyromegaly noted.  CV: RRR with no murmurs appreciated Pulm: NWOB, CTAB with no crackles, wheezes, or rhonchi GI: Normal bowel sounds present. Soft, Nontender, Nondistended. MSK: no edema, cyanosis, or clubbing noted Skin: warm, dry Neuro: CN2-12 grossly intact. Strength 5/5 in upper and lower extremities. Reflexes symmetric and intact bilaterally. Normal minicog with 3/3 delayed word recall. Psych: Normal affect and thought content      Teanna Elem M.  Jerline Pain, MD 05/22/2020 9:54 AM

## 2020-05-22 NOTE — Assessment & Plan Note (Addendum)
At goal per JNC 8.  Continue lifestyle modifications.

## 2020-05-22 NOTE — Assessment & Plan Note (Signed)
Last lipid panel at goal.  Continue atorvastatin 40 mg daily and Zetia 5 mg daily.  Continue lifestyle modifications.

## 2020-06-05 ENCOUNTER — Other Ambulatory Visit: Payer: Self-pay | Admitting: Family Medicine

## 2020-06-05 DIAGNOSIS — J45990 Exercise induced bronchospasm: Secondary | ICD-10-CM

## 2020-06-05 DIAGNOSIS — J301 Allergic rhinitis due to pollen: Secondary | ICD-10-CM

## 2020-10-01 ENCOUNTER — Encounter: Payer: Self-pay | Admitting: Gastroenterology

## 2021-02-08 ENCOUNTER — Ambulatory Visit: Payer: Medicare Other | Admitting: Physician Assistant

## 2021-06-15 ENCOUNTER — Other Ambulatory Visit: Payer: Self-pay | Admitting: Family Medicine

## 2021-06-15 DIAGNOSIS — E785 Hyperlipidemia, unspecified: Secondary | ICD-10-CM

## 2021-07-13 ENCOUNTER — Other Ambulatory Visit: Payer: Self-pay | Admitting: Family Medicine

## 2021-07-13 DIAGNOSIS — E785 Hyperlipidemia, unspecified: Secondary | ICD-10-CM

## 2021-08-04 ENCOUNTER — Other Ambulatory Visit: Payer: Self-pay | Admitting: Family Medicine

## 2021-08-04 DIAGNOSIS — E785 Hyperlipidemia, unspecified: Secondary | ICD-10-CM

## 2021-08-20 ENCOUNTER — Other Ambulatory Visit: Payer: Self-pay | Admitting: Family Medicine

## 2021-08-20 DIAGNOSIS — E785 Hyperlipidemia, unspecified: Secondary | ICD-10-CM

## 2021-10-15 ENCOUNTER — Encounter: Payer: Self-pay | Admitting: *Deleted

## 2021-11-16 ENCOUNTER — Other Ambulatory Visit: Payer: Self-pay | Admitting: Family Medicine

## 2021-11-16 DIAGNOSIS — E785 Hyperlipidemia, unspecified: Secondary | ICD-10-CM

## 2021-12-08 ENCOUNTER — Other Ambulatory Visit: Payer: Self-pay | Admitting: Family Medicine

## 2021-12-08 DIAGNOSIS — E785 Hyperlipidemia, unspecified: Secondary | ICD-10-CM

## 2022-01-03 ENCOUNTER — Encounter: Payer: Self-pay | Admitting: *Deleted

## 2022-08-21 HISTORY — PX: INGUINAL HERNIA REPAIR: SUR1180

## 2022-12-12 ENCOUNTER — Encounter: Payer: Self-pay | Admitting: Family Medicine

## 2022-12-13 NOTE — Telephone Encounter (Signed)
I last saw him 2.5 years ago. It is ok for him to schedule an appointment.  Katina Degree. Jimmey Ralph, MD 12/13/2022 12:22 PM

## 2022-12-13 NOTE — Telephone Encounter (Signed)
Please advise 

## 2023-01-02 ENCOUNTER — Ambulatory Visit: Payer: Medicare PPO | Admitting: Family Medicine

## 2023-01-02 ENCOUNTER — Encounter: Payer: Self-pay | Admitting: Family Medicine

## 2023-01-02 VITALS — BP 164/94 | HR 68 | Temp 97.7°F | Ht 70.5 in | Wt 178.4 lb

## 2023-01-02 DIAGNOSIS — E785 Hyperlipidemia, unspecified: Secondary | ICD-10-CM

## 2023-01-02 DIAGNOSIS — Z8546 Personal history of malignant neoplasm of prostate: Secondary | ICD-10-CM

## 2023-01-02 DIAGNOSIS — Z23 Encounter for immunization: Secondary | ICD-10-CM

## 2023-01-02 DIAGNOSIS — Z1211 Encounter for screening for malignant neoplasm of colon: Secondary | ICD-10-CM

## 2023-01-02 DIAGNOSIS — H9193 Unspecified hearing loss, bilateral: Secondary | ICD-10-CM

## 2023-01-02 DIAGNOSIS — R739 Hyperglycemia, unspecified: Secondary | ICD-10-CM | POA: Diagnosis not present

## 2023-01-02 DIAGNOSIS — K219 Gastro-esophageal reflux disease without esophagitis: Secondary | ICD-10-CM | POA: Diagnosis not present

## 2023-01-02 DIAGNOSIS — I10 Essential (primary) hypertension: Secondary | ICD-10-CM

## 2023-01-02 LAB — COMPREHENSIVE METABOLIC PANEL
ALT: 24 U/L (ref 0–53)
AST: 23 U/L (ref 0–37)
Albumin: 4.5 g/dL (ref 3.5–5.2)
Alkaline Phosphatase: 73 U/L (ref 39–117)
BUN: 20 mg/dL (ref 6–23)
CO2: 28 meq/L (ref 19–32)
Calcium: 9.6 mg/dL (ref 8.4–10.5)
Chloride: 106 meq/L (ref 96–112)
Creatinine, Ser: 1.04 mg/dL (ref 0.40–1.50)
GFR: 71.58 mL/min (ref >60.00)
Glucose, Bld: 96 mg/dL (ref 70–99)
Potassium: 3.6 meq/L (ref 3.5–5.1)
Sodium: 143 meq/L (ref 135–145)
Total Bilirubin: 0.6 mg/dL (ref 0.2–1.2)
Total Protein: 7.1 g/dL (ref 6.0–8.3)

## 2023-01-02 LAB — CBC
HCT: 43.3 % (ref 39.0–52.0)
Hemoglobin: 14.7 g/dL (ref 13.0–17.0)
MCHC: 33.9 g/dL (ref 30.0–36.0)
MCV: 96.9 fL (ref 78.0–100.0)
Platelets: 295 10*3/uL (ref 150.0–400.0)
RBC: 4.47 Mil/uL (ref 4.22–5.81)
RDW: 13.9 % (ref 11.5–15.5)
WBC: 5.5 10*3/uL (ref 4.0–10.5)

## 2023-01-02 LAB — LIPID PANEL
Cholesterol: 143 mg/dL (ref 0–200)
HDL: 49 mg/dL (ref >39.00)
LDL Cholesterol: 82 mg/dL (ref 0–99)
NonHDL: 93.63
Total CHOL/HDL Ratio: 3
Triglycerides: 59 mg/dL (ref 0.0–149.0)
VLDL: 11.8 mg/dL (ref 0.0–40.0)

## 2023-01-02 LAB — TSH: TSH: 1.78 u[IU]/mL (ref 0.35–5.50)

## 2023-01-02 LAB — HEMOGLOBIN A1C: Hgb A1c MFr Bld: 5.8 % (ref 4.6–6.5)

## 2023-01-02 LAB — PSA: PSA: 0 ng/mL — ABNORMAL LOW (ref 0.10–4.00)

## 2023-01-02 MED ORDER — PANTOPRAZOLE SODIUM 40 MG PO TBEC: 40.0000 mg | DELAYED_RELEASE_TABLET | Freq: Every day | ORAL | 3 refills | Status: DC

## 2023-01-02 NOTE — Assessment & Plan Note (Signed)
No red flag signs or symptoms.  He does have some slight rectus diastases on exam however discussed with patient this is not related to his current symptoms.  We will treat with PPI for the next couple weeks.  He will follow-up with [place] on MyChart at that time.  If symptoms return or do not improve with PPI will refer to GI.

## 2023-01-02 NOTE — Assessment & Plan Note (Signed)
Check PSA. ?

## 2023-01-02 NOTE — Progress Notes (Signed)
Dustin Burton is a 72 y.o. male who presents today for an office visit.  He is reestablishing care.  Assessment/Plan:  New/Acute Problems: Decreased Hearing  Failed an office hearing test today.  Will refer to audiology.  Chronic Problems Addressed Today: GERD (gastroesophageal reflux disease) No red flag signs or symptoms.  He does have some slight rectus diastases on exam however discussed with patient this is not related to his current symptoms.  We will treat with PPI for the next couple weeks.  He will follow-up with [place] on MyChart at that time.  If symptoms return or do not improve with PPI will refer to GI.  Hyperlipidemia with target LDL less than 70 Last lipid panel at goal per patient.  He is on atorvastatin 40 mg daily and Zetia 5 mg daily.  Does not need refill today.  Check labs.  History of prostate cancer s/p prostatectomy 2013 Check PSA.  Hyperglycemia Check A1c.  Hypertension Moderately elevated today.  He attributes this to being under more stress recently.  He has been monitoring at home and is typically been well-controlled.  He would like to hold off on starting medications at this point.  Will check labs today.  He will monitor at home for the next week or 2 and check in with [place] via MyChart.  If persistently elevated will start amlodipine.  We did discuss lifestyle interventions including regular exercise and low-sodium diet.  Preventative health care Will refer for colonoscopy.  Check labs today.  Prevnar 20 given today.    Subjective:  HPI:  See A/P for status of chronic conditions.  Patient here today for follow-up.  Last seen 2 and a half years ago.  Since our last visit he did move to [place].  He is now back in [place] and is reestablishing here. Overall he feels well today.   He has been having some issues with indigestion for the last several days. He started omeprazole a couple of days ago. He is having a lot of belching. Some regurgitation. No  dysphagia. He is not sure if omeprazole has helped. No nausea. No vomiting. He has been having some loose stools but this is normal for him. No melena or hematochezia. He is taking a probiotic and a fiber supplement.  Symptoms usually start within an hour or so after eating and then resolve afterwards.  No early satiety.  No hematemesis.  He did establish with a new PCP while in [place] and had a labs done about a year and a half ago.  These were all at goal.  He also had inguinal hernia repair a few months ago while in [place].  He has had some pain postoperatively but does seem to be healing well. He has been monitoring blood pressure at home and is typically sitting 130s to 140s over 70s to 80s.  He is also worried about decreased hearing.  He is having difficulty with falling conversations and crying restaurants.  No ear pain.  He did have inguinal hernia repair in [place] a few months ago. He did have labs done about a year ago. This were all at goal per patient.   ROS: Per HPI, otherwise a complete review of systems was negative.   PMH:  The following were reviewed and entered/updated in epic: Past Medical History:  Diagnosis Date   Brachial plexitis    Elevated LFTs 07/2001   Hemorrhoids    Hypercholesteremia    IBS (irritable bowel syndrome)  Prostate cancer ([facility])    Seasonal allergies    Patient Active Problem List   Diagnosis Date Noted   GERD (gastroesophageal reflux disease) 01/02/2023   Hyperglycemia 10/30/2018   Hypertension 12/01/2017   Chronic seasonal allergic rhinitis due to pollen 02/12/2016   History of prostate cancer s/p prostatectomy 2013 01/03/2014   Bronchiectasis ([facility]) 05/28/2013   Polyneuropathy 02/17/2013   Positive ANA (antinuclear antibody) 01/28/2013   Lymphocytic colitis 03/31/2012   Hyperlipidemia with target LDL less than 70 04/23/2007   Past Surgical History:  Procedure Laterality Date   HAND SURGERY Left    index finger   INGUINAL HERNIA  REPAIR Right 08/20/2012   KNEE SURGERY Right    right   PROSTATECTOMY      Family History  Problem Relation Age of Onset   Hypertension Mother    Coronary artery disease Mother        PCI   Crohn's disease Mother    Hyperlipidemia Mother    Hypertension Father        carotid endarterectomy   Colon cancer Father    Crohn's disease Father    Hyperlipidemia Father    Alzheimer's disease Father    Cystic fibrosis Other        niece    Medications- reviewed and updated Current Outpatient Medications  Medication Sig Dispense Refill   atorvastatin (LIPITOR) 40 MG tablet TAKE 1 TABLET BY MOUTH EVERY DAY 90 tablet 0   ezetimibe (ZETIA) 10 MG tablet TAKE 0.5 TABLETS (5 MG TOTAL) BY MOUTH DAILY. 45 tablet 3   fluticasone (FLONASE) 50 MCG/ACT nasal spray Place into both nostrils daily.     omeprazole (PRILOSEC OTC) 20 MG tablet Take 20 mg by mouth daily.     pantoprazole (PROTONIX) 40 MG tablet Take 1 tablet (40 mg total) by mouth daily. 30 tablet 3   No current facility-administered medications for this visit.    Allergies-reviewed and updated Allergies  Allergen Reactions   Tadalafil Other (See Comments)    Muscular leg pain    Social History   Socioeconomic History   Marital status: Married    Spouse name: Not on file   Number of children: Not on file   Years of education: Not on file   Highest education level: Not on file  Occupational History   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    Employer: RETIRED    Comment: Retired  Tobacco Use   Smoking status: Never   Smokeless tobacco: Never  Vaping Use   Vaping status: Never Used  Substance and Sexual Activity   Alcohol use: Yes    Comment: socially   Drug use: No   Sexual activity: Not on file  Other Topics Concern   Not on file  Social History Narrative   Not on file   Social Drivers of Health   Financial Resource Strain: Not on file  Food Insecurity: Not on file  Transportation Needs: Not on  file  Physical Activity: Not on file  Stress: Not on file  Social Connections: Not on file          Objective:  Physical Exam: BP (!) 164/94   Pulse 68   Temp 97.7 F (36.5 C) (Temporal)   Ht 5' 10.5" (1.791 m)   Wt 178 lb 6.4 oz (80.9 kg)   SpO2 98%   BMI 25.24 kg/m   Gen: No acute distress, resting comfortably HEENT: TMs clear bilaterally. CV: Regular rate and rhythm with no murmurs  appreciated Pulm: Normal work of breathing, clear to auscultation bilaterally with no crackles, wheezes, or rhonchi Abdomen: Rectus diastases noted.  Soft, nontender, nondistended.  Bowel sounds present. Neuro: Grossly normal, moves all extremities Psych: Normal affect and thought content  Time Spent: 45 minutes of total time was spent on the date of the encounter performing the following actions: chart review prior to seeing the patient including recent visits here, obtaining history, performing a medically necessary exam, counseling on the treatment plan, placing orders, and documenting in our EHR.        Katina Degree. Jimmey Ralph, MD 01/02/2023 9:33 AM

## 2023-01-02 NOTE — Patient Instructions (Signed)
It was very nice to see you today!  Please try the Protonix 40 mg daily for the next couple of weeks.  Let me know in a couple of weeks how you are doing.  If you are still having symptoms after this we will need to refer you to see GI.  Please monitor your blood pressure at home and check in with [place] in a week or 2.  Please let us know if you develop any symptoms including chest pain, shortness of breath, headache, vision changes, weakness, numbness, etc.  I will refer you to GI for your colonoscopy.  We will check blood work today.  Will give you your pneumonia vaccine today.  Return in about 1 year (around 01/02/2024) for Annual Physical.   Take care, Dr Jimmey Ralph  PLEASE NOTE:  If you had any lab tests, please let us know if you have not heard back within a few days. You may see your results on mychart before we have a chance to review them but we will give you a call once they are reviewed by [place].   If we ordered any referrals today, please let us know if you have not heard from their office within the next week.   If you had any urgent prescriptions sent in today, please check with the pharmacy within an hour of our visit to make sure the prescription was transmitted appropriately.   Please try these tips to maintain a healthy lifestyle:  Eat at least 3 REAL meals and 1-2 snacks per day.  Aim for no more than 5 hours between eating.  If you eat breakfast, please do so within one hour of getting up.   Each meal should contain half fruits/vegetables, one quarter protein, and one quarter carbs (no bigger than a computer mouse)  Cut down on sweet beverages. This includes juice, soda, and sweet tea.   Drink at least 1 glass of water with each meal and aim for at least 8 glasses per day  Exercise at least 150 minutes every week.

## 2023-01-02 NOTE — Assessment & Plan Note (Signed)
Check A1c. 

## 2023-01-02 NOTE — Assessment & Plan Note (Signed)
Last lipid panel at goal per patient.  He is on atorvastatin 40 mg daily and Zetia 5 mg daily.  Does not need refill today.  Check labs.

## 2023-01-02 NOTE — Assessment & Plan Note (Signed)
Moderately elevated today.  He attributes this to being under more stress recently.  He has been monitoring at home and is typically been well-controlled.  He would like to hold off on starting medications at this point.  Will check labs today.  He will monitor at home for the next week or 2 and check in with [place] via MyChart.  If persistently elevated will start amlodipine.  We did discuss lifestyle interventions including regular exercise and low-sodium diet.

## 2023-01-03 ENCOUNTER — Encounter: Payer: Self-pay | Admitting: Gastroenterology

## 2023-01-06 NOTE — Progress Notes (Signed)
Labs are all at goal.  PSA is 0.  He should keep up the great work and we can recheck everything in a year.

## 2023-01-08 ENCOUNTER — Encounter: Payer: Self-pay | Admitting: Family Medicine

## 2023-01-09 NOTE — Telephone Encounter (Signed)
I appreciate the update.  It is likely that cutting down on sodium will help significantly with his blood pressure readings however it would be a reasonable idea for [place] to start a blood pressure medication now while still working on this.    Recommend starting amlodipine 5 mg daily.  Please send in new prescription if he is agreeable.  This can cause mild leg swelling but usually no other significant side effects.  I would like for him to follow-up with [place] in a couple of weeks to let us know how he is doing.

## 2023-01-09 NOTE — Telephone Encounter (Signed)
Please advise 

## 2023-01-13 ENCOUNTER — Other Ambulatory Visit: Payer: Self-pay | Admitting: *Deleted

## 2023-01-13 MED ORDER — AMLODIPINE BESYLATE 5 MG PO TABS: 5.0000 mg | ORAL_TABLET | Freq: Every day | ORAL | 3 refills | Status: DC

## 2023-01-13 NOTE — Telephone Encounter (Signed)
Rx amlodipine 5 mg daily send to [facility] pharmacy

## 2023-01-25 ENCOUNTER — Other Ambulatory Visit: Payer: Self-pay | Admitting: Family Medicine

## 2023-03-05 ENCOUNTER — Encounter: Payer: Medicare PPO | Admitting: Gastroenterology

## 2023-03-30 ENCOUNTER — Other Ambulatory Visit: Payer: Self-pay | Admitting: Family Medicine

## 2023-04-01 ENCOUNTER — Other Ambulatory Visit: Payer: Self-pay | Admitting: *Deleted

## 2023-04-01 MED ORDER — AMLODIPINE BESYLATE 5 MG PO TABS: 5.0000 mg | ORAL_TABLET | Freq: Every day | ORAL | 1 refills | Status: DC

## 2023-05-06 ENCOUNTER — Encounter: Payer: Medicare Other | Admitting: Family Medicine

## 2023-05-14 ENCOUNTER — Other Ambulatory Visit: Payer: Self-pay | Admitting: Medical Genetics

## 2023-05-22 ENCOUNTER — Ambulatory Visit: Payer: Medicare PPO | Attending: Family Medicine | Admitting: Audiology

## 2023-05-22 DIAGNOSIS — H903 Sensorineural hearing loss, bilateral: Secondary | ICD-10-CM | POA: Diagnosis present

## 2023-05-22 NOTE — Procedures (Signed)
  Outpatient Audiology and [facility] [address] [place], [place]  [address] [phone]  AUDIOLOGICAL  EVALUATION  NAME: Dustin Burton     DOB:   05/05/1950      MRN: [id number]                                                                                     DATE: 05/22/2023     REFERENT: Rodney Clamp, MD STATUS: Outpatient DIAGNOSIS: Sensorineural hearing loss, bilateral  History: Dustin Burton was seen for an audiological evaluation due to concerns regarding his hearing sensitivity.  Deborah reports decreased hearing occurring for 1.5 years.  He reports his wife has concerns regarding his hearing sensitivity.  Dustin Burton reports constant bilateral tinnitus.  He reports the tinnitus started many years ago and he has found ways to manage his tinnitus.  He denies otalgia, aural fullness, and dizziness.  Evaluation:  Otoscopy showed a clear view of the tympanic membranes, bilaterally Tympanometry results were consistent with middle ear pressure and normal tympanic membrane mobility (Type A) bilaterally Audiometric testing was completed using Conventional Audiometry techniques with insert earphones and TDH headphones. Test results are consistent with a mild to moderate sensorineural hearing loss, bilaterally. Speech Recognition Thresholds were obtained at 35 dB HL in the right ear and at 35 dB HL in the left ear. Word Recognition Testing was completed at 70 dB HL and Dustin Burton scored 100% bilaterally    Results:  The test results were reviewed with Dustin Burton.  Today's test results are consistent with a mild to moderate sensorineural hearing loss, bilaterally.  Dustin Burton will have hearing and communication difficulty in many listening environments and he will benefit from the use of good communication strategies and the use of amplification.  Dustin Burton was given a list of hearing aid providers in the [place] area as [facility] currently does not dispense hearing aids.  Dustin Burton was encouraged to inquire with  his insurance company regarding a possible hearing aid benefit. Dustin Burton was counseled regarding his tinnitus and use of tinnitus management strategies.   Recommendations: 1.   Communication needs assessment with an audiologist who dispenses hearing aids to further discuss hearing aids and to be fit with hearing aids. 2.   Monitor hearing sensitivity 3.   Use of tinnitus management strategies.   30 minutes spent testing and counseling on results.   If you have any questions please feel free to contact me at [phone].  Bert Britain Audiologist, Au.D., CCC-A 05/22/2023  9:24 AM  Cc: Rodney Clamp, MD

## 2023-06-23 ENCOUNTER — Ambulatory Visit (AMBULATORY_SURGERY_CENTER)

## 2023-06-23 VITALS — Ht 70.5 in | Wt 170.0 lb

## 2023-06-23 DIAGNOSIS — R112 Nausea with vomiting, unspecified: Secondary | ICD-10-CM

## 2023-06-23 DIAGNOSIS — Z800 Family history of malignant neoplasm of digestive organs: Secondary | ICD-10-CM

## 2023-06-23 DIAGNOSIS — Z860100 Personal history of colon polyps, unspecified: Secondary | ICD-10-CM

## 2023-06-23 MED ORDER — NA SULFATE-K SULFATE-MG SULF 17.5-3.13-1.6 GM/177ML PO SOLN: 1.0000 | Freq: Once | ORAL | 0 refills | Status: AC

## 2023-06-23 MED ORDER — ONDANSETRON HCL 4 MG PO TABS
4.0000 mg | ORAL_TABLET | ORAL | 0 refills | Status: DC
Start: 2023-06-23 — End: 2023-07-24

## 2023-06-23 NOTE — Progress Notes (Signed)

## 2023-06-25 ENCOUNTER — Encounter: Payer: Self-pay | Admitting: Gastroenterology

## 2023-07-01 ENCOUNTER — Telehealth: Payer: Self-pay | Admitting: Gastroenterology

## 2023-07-01 NOTE — Telephone Encounter (Signed)
 Good morning Dr. General Kenner, I received a call from this patient requesting to reschedule his procedure set for June the 19 th due to him having flu like symptoms as well as having a fever last night. Patient was reschedule for June the 26 th. Please advise.

## 2023-07-01 NOTE — Telephone Encounter (Signed)
Okay got it. Thank you

## 2023-07-01 NOTE — Telephone Encounter (Signed)
 To clarify, his procedure was originally scheduled for June 19?  He could still potentially keep that appointment as it is 9 days away.  If he is otherwise comfortable keeping his appointment on 26 that is fine.

## 2023-07-10 ENCOUNTER — Encounter: Admitting: Gastroenterology

## 2023-07-17 ENCOUNTER — Encounter: Payer: Self-pay | Admitting: Gastroenterology

## 2023-07-17 ENCOUNTER — Ambulatory Visit: Admitting: Gastroenterology

## 2023-07-17 VITALS — BP 139/80 | HR 58 | Temp 98.8°F | Resp 13 | Ht 70.5 in | Wt 170.0 lb

## 2023-07-17 DIAGNOSIS — Z860100 Personal history of colon polyps, unspecified: Secondary | ICD-10-CM

## 2023-07-17 DIAGNOSIS — D122 Benign neoplasm of ascending colon: Secondary | ICD-10-CM | POA: Diagnosis not present

## 2023-07-17 DIAGNOSIS — K648 Other hemorrhoids: Secondary | ICD-10-CM

## 2023-07-17 DIAGNOSIS — K6389 Other specified diseases of intestine: Secondary | ICD-10-CM

## 2023-07-17 DIAGNOSIS — K5140 Inflammatory polyps of colon without complications: Secondary | ICD-10-CM

## 2023-07-17 DIAGNOSIS — D123 Benign neoplasm of transverse colon: Secondary | ICD-10-CM

## 2023-07-17 DIAGNOSIS — D120 Benign neoplasm of cecum: Secondary | ICD-10-CM

## 2023-07-17 DIAGNOSIS — K635 Polyp of colon: Secondary | ICD-10-CM

## 2023-07-17 DIAGNOSIS — Z1211 Encounter for screening for malignant neoplasm of colon: Secondary | ICD-10-CM | POA: Diagnosis not present

## 2023-07-17 MED ORDER — SODIUM CHLORIDE 0.9 % IV SOLN: 500.0000 mL | Freq: Once | INTRAVENOUS | Status: DC

## 2023-07-17 NOTE — Progress Notes (Signed)
 [facility] History and Physical   Primary Care Physician:  Kennyth Worth HERO, MD   Reason for Procedure:   History of colon polyps  Plan:    colonoscopy     HPI: Dustin Burton is a 73 y.o. male  here for colonoscopy surveillance - adenoma removed 07/2015, father had colon cancer dx age 34s   . Patient denies any bowel symptoms at this time. Otherwise feels well without any cardiopulmonary symptoms.   I have discussed risks / benefits of anesthesia and endoscopic procedure with Dustin Burton and they wish to proceed with the exams as outlined today.    Past Medical History:  Diagnosis Date   Brachial plexitis    Elevated LFTs 07/21/2001   Hemorrhoids    Hypercholesteremia    Hypertension    IBS (irritable bowel syndrome)    Prostate cancer (HCC)    Seasonal allergies     Past Surgical History:  Procedure Laterality Date   HAND SURGERY Left    index finger   INGUINAL HERNIA REPAIR Right 08/21/2022   KNEE SURGERY Right    right   PROSTATECTOMY      Prior to Admission medications   Medication Sig Start Date End Date Taking? Authorizing Provider  amLODipine  (NORVASC ) 5 MG tablet Take 1 tablet (5 mg total) by mouth daily. 04/01/23  Yes Kennyth Worth HERO, MD  atorvastatin  (LIPITOR) 40 MG tablet TAKE 1 TABLET BY MOUTH EVERY DAY 08/21/21  Yes Kennyth Worth HERO, MD  ondansetron  (ZOFRAN ) 4 MG tablet Take 1 tablet (4 mg total) by mouth as directed. Take 1 tablet by mouth, for nausea, 30 to 45 minutes before drinking each 1/2 colonoscopy prep 06/23/23  Yes Rashad Obeid, Elspeth SQUIBB, MD  ezetimibe  (ZETIA ) 10 MG tablet TAKE 0.5 TABLETS (5 MG TOTAL) BY MOUTH DAILY. 05/22/20 06/23/23  Kennyth Worth HERO, MD  fluticasone  (FLONASE ) 50 MCG/ACT nasal spray Place into both nostrils daily.    [provider]  omeprazole  (PRILOSEC OTC) 20 MG tablet Take 20 mg by mouth daily.    [provider]  pantoprazole  (PROTONIX ) 40 MG tablet TAKE 1 TABLET BY MOUTH EVERY DAY 03/31/23   Kennyth Worth HERO, MD    Current Outpatient Medications  Medication Sig Dispense Refill   amLODipine  (NORVASC ) 5 MG tablet Take 1 tablet (5 mg total) by mouth daily. 90 tablet 1   atorvastatin  (LIPITOR) 40 MG tablet TAKE 1 TABLET BY MOUTH EVERY DAY 90 tablet 0   ondansetron  (ZOFRAN ) 4 MG tablet Take 1 tablet (4 mg total) by mouth as directed. Take 1 tablet by mouth, for nausea, 30 to 45 minutes before drinking each 1/2 colonoscopy prep 2 tablet 0   ezetimibe  (ZETIA ) 10 MG tablet TAKE 0.5 TABLETS (5 MG TOTAL) BY MOUTH DAILY. 45 tablet 3   fluticasone  (FLONASE ) 50 MCG/ACT nasal spray Place into both nostrils daily.     omeprazole  (PRILOSEC OTC) 20 MG tablet Take 20 mg by mouth daily.     pantoprazole  (PROTONIX ) 40 MG tablet TAKE 1 TABLET BY MOUTH EVERY DAY 90 tablet 1   Current Facility-Administered Medications  Medication Dose Route Frequency Provider Last Rate Last Admin   0.9 %  sodium chloride  infusion  500 mL Intravenous Once Sherina Stammer, Elspeth SQUIBB, MD        Allergies as of 07/17/2023 - Review Complete 07/17/2023  Allergen Reaction Noted   Tadalafil Other (See Comments) 04/14/2009    Family History  Problem Relation Age of Onset   Hypertension  Mother    Coronary artery disease Mother        PCI   Crohn's disease Mother    Hyperlipidemia Mother    Hypertension Father        carotid endarterectomy   Colon cancer Father    Crohn's disease Father    Hyperlipidemia Father    Alzheimer's disease Father    Cystic fibrosis Other        niece   Rectal cancer Neg Hx    Stomach cancer Neg Hx    Esophageal cancer Neg Hx     Social History   Socioeconomic History   Marital status: Married    Spouse name: Not on file   Number of children: Not on file   Years of education: Not on file   Highest education level: Not on file  Occupational History   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    Employer: RETIRED    Comment: Retired  Tobacco Use   Smoking status: Never    Smokeless tobacco: Never  Vaping Use   Vaping status: Never Used  Substance and Sexual Activity   Alcohol use: Yes    Comment: socially   Drug use: No   Sexual activity: Not on file  Other Topics Concern   Not on file  Social History Narrative   Not on file   Social Drivers of Health   Financial Resource Strain: Not on file  Food Insecurity: Not on file  Transportation Needs: Not on file  Physical Activity: Not on file  Stress: Not on file  Social Connections: Not on file  Intimate Partner Violence: Not on file    Review of Systems: All other review of systems negative except as mentioned in the HPI.  Physical Exam: Vital signs BP (!) 164/98   Pulse 82   Temp 98.8 F (37.1 C) (Temporal)   Ht 5' 10.5 (1.791 m)   Wt 170 lb (77.1 kg)   SpO2 99%   BMI 24.05 kg/m   General:   Alert,  Well-developed, pleasant and cooperative in NAD Lungs:  Clear throughout to auscultation.   Heart:  Regular rate and rhythm Abdomen:  Soft, nontender and nondistended.   Neuro/Psych:  Alert and cooperative. Normal mood and affect. A and O x 3  Marcey Naval, MD [facility]

## 2023-07-17 NOTE — Op Note (Signed)
 [facility] Patient Name: Dustin Burton Procedure Date: 07/17/2023 8:58 AM MRN: [id number] Endoscopist: Elspeth P. Leigh , MD, [id number] Age: 73 Referring MD:  Date of Birth: 09-15-1950 Gender: Male Account #: [id number] Procedure:                Colonoscopy Indications:              High risk colon cancer surveillance: Personal                            history of colonic polyps - 07/2015 - 3 polyps,                            father had colon cancer dx age 68s Medicines:                Monitored Anesthesia Care Procedure:                Pre-Anesthesia Assessment:                           - Prior to the procedure, a History and Physical                            was performed, and patient medications and                            allergies were reviewed. The patient's tolerance of                            previous anesthesia was also reviewed. The risks                            and benefits of the procedure and the sedation                            options and risks were discussed with the patient.                            All questions were answered, and informed consent                            was obtained. Prior Anticoagulants: The patient has                            taken no anticoagulant or antiplatelet agents. ASA                            Grade Assessment: II - A patient with mild systemic                            disease. After reviewing the risks and benefits,                            the patient was deemed in satisfactory condition to  undergo the procedure.                           After obtaining informed consent, the colonoscope                            was passed under direct vision. Throughout the                            procedure, the patient's blood pressure, pulse, and                            oxygen saturations were monitored continuously. The                            [id number]-HQ190L Colonoscope  [id number] was introduced                            through the anus and advanced to the the cecum,                            identified by appendiceal orifice and ileocecal                            valve. The colonoscopy was performed without                            difficulty. The patient tolerated the procedure                            well. The quality of the bowel preparation was                            good. The ileocecal valve, appendiceal orifice, and                            rectum were photographed. Scope In: 9:04:01 AM Scope Out: 9:23:42 AM Scope Withdrawal Time: 0 hours 13 minutes 44 seconds  Total Procedure Duration: 0 hours 19 minutes 41 seconds  Findings:                 The perianal and digital rectal examinations were                            normal.                           A diminutive polyp was found in the cecum. The                            polyp was sessile. The polyp was removed with a                            cold snare. Resection and retrieval were complete.  A 3 mm polyp was found in the ascending colon. The                            polyp was sessile. The polyp was removed with a                            cold snare. Resection and retrieval were complete.                           Two flat polyps were found in the transverse colon.                            The polyps were 2 to 4 mm in size. These polyps                            were removed with a cold snare. Resection and                            retrieval were complete.                           Internal hemorrhoids were found during                            retroflexion. The hemorrhoids were small.                           The exam was otherwise without abnormality. Complications:            No immediate complications. Estimated blood loss:                            Minimal. Estimated Blood Loss:     Estimated blood loss was minimal. Impression:                - One diminutive polyp in the cecum, removed with a                            cold snare. Resected and retrieved.                           - One 3 mm polyp in the ascending colon, removed                            with a cold snare. Resected and retrieved.                           - Two 2 to 4 mm polyps in the transverse colon,                            removed with a cold snare. Resected and retrieved.                           - Internal  hemorrhoids.                           - The examination was otherwise normal. Recommendation:           - Patient has a contact number available for                            emergencies. The signs and symptoms of potential                            delayed complications were discussed with the                            patient. Return to normal activities tomorrow.                            Written discharge instructions were provided to the                            patient.                           - Resume previous diet.                           - Continue present medications.                           - Await pathology results. Elspeth P. Leigh, MD 07/17/2023 9:29:21 AM This report has been signed electronically.

## 2023-07-17 NOTE — Progress Notes (Signed)
 Vss nad trans to pacu

## 2023-07-17 NOTE — Progress Notes (Signed)
 Called to room to assist during endoscopic procedure.  Patient ID and intended procedure confirmed with present staff. Received instructions for my participation in the procedure from the performing physician.

## 2023-07-17 NOTE — Patient Instructions (Signed)

## 2023-07-18 ENCOUNTER — Telehealth: Payer: Self-pay | Admitting: *Deleted

## 2023-07-18 NOTE — Telephone Encounter (Signed)
 Post procedure follow up call placed, no answer and left VM.

## 2023-07-21 LAB — SURGICAL PATHOLOGY

## 2023-07-22 ENCOUNTER — Ambulatory Visit: Payer: Self-pay | Admitting: Gastroenterology

## 2023-07-24 ENCOUNTER — Ambulatory Visit (INDEPENDENT_AMBULATORY_CARE_PROVIDER_SITE_OTHER): Admitting: Family Medicine

## 2023-07-24 ENCOUNTER — Encounter: Payer: Self-pay | Admitting: Family Medicine

## 2023-07-24 VITALS — BP 138/74 | HR 78 | Temp 98.0°F | Ht 70.5 in | Wt 175.8 lb

## 2023-07-24 DIAGNOSIS — I10 Essential (primary) hypertension: Secondary | ICD-10-CM

## 2023-07-24 DIAGNOSIS — Z8546 Personal history of malignant neoplasm of prostate: Secondary | ICD-10-CM | POA: Diagnosis not present

## 2023-07-24 DIAGNOSIS — K219 Gastro-esophageal reflux disease without esophagitis: Secondary | ICD-10-CM

## 2023-07-24 DIAGNOSIS — Z0001 Encounter for general adult medical examination with abnormal findings: Secondary | ICD-10-CM | POA: Diagnosis not present

## 2023-07-24 DIAGNOSIS — R739 Hyperglycemia, unspecified: Secondary | ICD-10-CM

## 2023-07-24 DIAGNOSIS — E785 Hyperlipidemia, unspecified: Secondary | ICD-10-CM

## 2023-07-24 NOTE — Assessment & Plan Note (Signed)
 Last PSA is undetectable.  May have some scar tissue contributing to his urinary hesitancy as above.  We did discuss trial of other antihypertensive however he like to hold off on this for now.  No signs or symptoms of urinary tract infection - deferred urine sample today.

## 2023-07-24 NOTE — Assessment & Plan Note (Signed)
 At goal today on amlodipine  5 mg daily.  He has having some urinary hesitancy issues which he believes may be due to the amlodipine .  Discussed with patient that this is not a typical side effect of this however due to his urologic history it is possible that that this may have interfered with the smooth muscle contraction.  We did discuss trial of different antihypertensive however he would like to hold off on that for now.  He will continue current dose of amlodipine  and let us  know if symptoms do not improve over the next few weeks.

## 2023-07-24 NOTE — Progress Notes (Signed)
 Chief Complaint:  Dustin Burton is a 73 y.o. male who presents today for his annual comprehensive physical exam.    Assessment/Plan:  Chronic Problems Addressed Today: Hypertension At goal today on amlodipine  5 mg daily.  He has having some urinary hesitancy issues which he believes may be due to the amlodipine .  Discussed with patient that this is not a typical side effect of this however due to his urologic history it is possible that that this may have interfered with the smooth muscle contraction.  We did discuss trial of different antihypertensive however he would like to hold off on that for now.  He will continue current dose of amlodipine  and let us  know if symptoms do not improve over the next few weeks.  Hyperlipidemia with target LDL less than 70 At goal on recent labs on Lipitor 40 mg daily.  Do not need to recheck today.  History of prostate cancer s/p prostatectomy 2013 Last PSA is undetectable.  May have some scar tissue contributing to his urinary hesitancy as above.  We did discuss trial of other antihypertensive however he like to hold off on this for now.  No signs or symptoms of urinary tract infection - deferred urine sample today.  Hyperglycemia A1c 5.8 on recent labs.   Preventative Healthcare: Up-to-date on colon cancer screening and vaccines.   Patient Counseling(The following topics were reviewed and/or handout was given):  -Nutrition: Stressed importance of moderation in sodium/caffeine intake, saturated fat and cholesterol, caloric balance, sufficient intake of fresh fruits, vegetables, and fiber.  -Stressed the importance of regular exercise.   -Substance Abuse: Discussed cessation/primary prevention of tobacco, alcohol, or other drug use; driving or other dangerous activities under the influence; availability of treatment for abuse.   -Injury prevention: Discussed safety belts, safety helmets, smoke detector, smoking near bedding or upholstery.   -Sexuality:  Discussed sexually transmitted diseases, partner selection, use of condoms, avoidance of unintended pregnancy and contraceptive alternatives.   -Dental health: Discussed importance of regular tooth brushing, flossing, and dental visits.  -Health maintenance and immunizations reviewed. Please refer to Health maintenance section.  Return to care in 1 year for next preventative visit.     Subjective:  HPI:  See A/P for status of chronic conditions.  Patient is here today for annual physical.  Overall is doing well today.  I last saw him about 7 months ago.  At that time we discussed high blood pressure and I sent a prescription in for amlodipine .  He started this a couple weeks after a visit.  His blood pressure readings have been well-controlled at home however he is concerned that it may be causing some issues with urinary hesitancy.  Not currently having any dysuria or frequency though has noted occasionally difficult for him to pass urine.  No fevers or chills.  Lifestyle Diet: Balanced. Plenty of fruits and vegetables. Trying to get more salads.  Exercise: Walking daily. Plays tennis routinely. Getting into resistance training.      07/24/2023   10:29 AM  Depression screen PHQ 2/9  Decreased Interest 0  Down, Depressed, Hopeless 0  PHQ - 2 Score 0    Health Maintenance Due  Topic Date Due   Medicare Annual Wellness (AWV)  05/22/2021     ROS: Per HPI, otherwise a complete review of systems was negative.   PMH:  The following were reviewed and entered/updated in epic: Past Medical History:  Diagnosis Date   Brachial plexitis    Elevated LFTs  07/21/2001   Hemorrhoids    Hypercholesteremia    Hypertension    IBS (irritable bowel syndrome)    Prostate cancer (HCC)    Seasonal allergies    Patient Active Problem List   Diagnosis Date Noted   GERD (gastroesophageal reflux disease) 01/02/2023   Hyperglycemia 10/30/2018   Hypertension 12/01/2017   Chronic seasonal allergic  rhinitis due to pollen 02/12/2016   History of prostate cancer s/p prostatectomy 2013 01/03/2014   Polyneuropathy 02/17/2013   Positive ANA (antinuclear antibody) 01/28/2013   Lymphocytic colitis 03/31/2012   Hyperlipidemia with target LDL less than 70 04/23/2007   Past Surgical History:  Procedure Laterality Date   HAND SURGERY Left    index finger   INGUINAL HERNIA REPAIR Right 08/21/2022   KNEE SURGERY Right    right   PROSTATECTOMY      Family History  Problem Relation Age of Onset   Hypertension Mother    Coronary artery disease Mother        PCI   Crohn's disease Mother    Hyperlipidemia Mother    Hypertension Father        carotid endarterectomy   Colon cancer Father    Crohn's disease Father    Hyperlipidemia Father    Alzheimer's disease Father    Cystic fibrosis Other        niece   Rectal cancer Neg Hx    Stomach cancer Neg Hx    Esophageal cancer Neg Hx     Medications- reviewed and updated Current Outpatient Medications  Medication Sig Dispense Refill   amLODipine  (NORVASC ) 5 MG tablet Take 1 tablet (5 mg total) by mouth daily. 90 tablet 1   atorvastatin  (LIPITOR) 40 MG tablet TAKE 1 TABLET BY MOUTH EVERY DAY 90 tablet 0   ezetimibe  (ZETIA ) 10 MG tablet TAKE 0.5 TABLETS (5 MG TOTAL) BY MOUTH DAILY. 45 tablet 3   No current facility-administered medications for this visit.    Allergies-reviewed and updated Allergies  Allergen Reactions   Tadalafil Other (See Comments)    Muscular leg pain    Social History   Socioeconomic History   Marital status: Married    Spouse name: Not on file   Number of children: Not on file   Years of education: Not on file   Highest education level: Not on file  Occupational History   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    Employer: RETIRED    Comment: Retired  Tobacco Use   Smoking status: Never   Smokeless tobacco: Never  Vaping Use   Vaping status: Never Used  Substance and Sexual Activity    Alcohol use: Yes    Comment: socially   Drug use: No   Sexual activity: Not on file  Other Topics Concern   Not on file  Social History Narrative   Not on file   Social Drivers of Health   Financial Resource Strain: Not on file  Food Insecurity: Not on file  Transportation Needs: Not on file  Physical Activity: Not on file  Stress: Not on file  Social Connections: Not on file        Objective:  Physical Exam: BP 138/74   Pulse 78   Temp 98 F (36.7 C) (Temporal)   Ht 5' 10.5 (1.791 m)   Wt 175 lb 12.8 oz (79.7 kg)   SpO2 98%   BMI 24.87 kg/m   Body mass index is 24.87 kg/m. Wt Readings from Last 3 Encounters:  07/24/23  175 lb 12.8 oz (79.7 kg)  07/17/23 170 lb (77.1 kg)  06/23/23 170 lb (77.1 kg)   Gen: NAD, resting comfortably HEENT: TMs normal bilaterally. OP clear. No thyromegaly noted.  CV: RRR with no murmurs appreciated Pulm: NWOB, CTAB with no crackles, wheezes, or rhonchi GI: Normal bowel sounds present. Soft, Nontender, Nondistended. MSK: no edema, cyanosis, or clubbing noted Skin: warm, dry Neuro: CN2-12 grossly intact. Strength 5/5 in upper and lower extremities. Reflexes symmetric and intact bilaterally.  Psych: Normal affect and thought content     Yailyn Strack M. Kennyth, MD 07/24/2023 11:08 AM

## 2023-07-24 NOTE — Patient Instructions (Signed)
 It was very nice to see you today!  No medication changes today.  Please continue to work on diet and exercise.  Please let me know if you are having any other issues with the amlodipine  in the next few weeks.  I will see back in year for your next physical.  Please come back to see us  sooner if needed.  Return in about 1 year (around 07/23/2024) for Annual Physical.   Take care, Dr Kennyth  PLEASE NOTE:  If you had any lab tests, please let us  know if you have not heard back within a few days. You may see your results on mychart before we have a chance to review them but we will give you a call once they are reviewed by us .   If we ordered any referrals today, please let us  know if you have not heard from their office within the next week.   If you had any urgent prescriptions sent in today, please check with the pharmacy within an hour of our visit to make sure the prescription was transmitted appropriately.   Please try these tips to maintain a healthy lifestyle:  Eat at least 3 REAL meals and 1-2 snacks per day.  Aim for no more than 5 hours between eating.  If you eat breakfast, please do so within one hour of getting up.   Each meal should contain half fruits/vegetables, one quarter protein, and one quarter carbs (no bigger than a computer mouse)  Cut down on sweet beverages. This includes juice, soda, and sweet tea.   Drink at least 1 glass of water with each meal and aim for at least 8 glasses per day  Exercise at least 150 minutes every week.    Preventive Care 48 Years and Older, Male Preventive care refers to lifestyle choices and visits with your health care provider that can promote health and wellness. Preventive care visits are also called wellness exams. What can I expect for my preventive care visit? Counseling During your preventive care visit, your health care provider may ask about your: Medical history, including: Past medical problems. Family medical  history. History of falls. Current health, including: Emotional well-being. Home life and relationship well-being. Sexual activity. Memory and ability to understand (cognition). Lifestyle, including: Alcohol, nicotine or tobacco, and drug use. Access to firearms. Diet, exercise, and sleep habits. Work and work Astronomer. Sunscreen use. Safety issues such as seatbelt and bike helmet use. Physical exam Your health care provider will check your: Height and weight. These may be used to calculate your BMI (body mass index). BMI is a measurement that tells if you are at a healthy weight. Waist circumference. This measures the distance around your waistline. This measurement also tells if you are at a healthy weight and may help predict your risk of certain diseases, such as type 2 diabetes and high blood pressure. Heart rate and blood pressure. Body temperature. Skin for abnormal spots. What immunizations do I need?  Vaccines are usually given at various ages, according to a schedule. Your health care provider will recommend vaccines for you based on your age, medical history, and lifestyle or other factors, such as travel or where you work. What tests do I need? Screening Your health care provider may recommend screening tests for certain conditions. This may include: Lipid and cholesterol levels. Diabetes screening. This is done by checking your blood sugar (glucose) after you have not eaten for a while (fasting). Hepatitis C test. Hepatitis B test. HIV (human  immunodeficiency virus) test. STI (sexually transmitted infection) testing, if you are at risk. Lung cancer screening. Colorectal cancer screening. Prostate cancer screening. Abdominal aortic aneurysm (AAA) screening. You may need this if you are a current or former smoker. Talk with your health care provider about your test results, treatment options, and if necessary, the need for more tests. Follow these instructions at  home: Eating and drinking  Eat a diet that includes fresh fruits and vegetables, whole grains, lean protein, and low-fat dairy products. Limit your intake of foods with high amounts of sugar, saturated fats, and salt. Take vitamin and mineral supplements as recommended by your health care provider. Do not drink alcohol if your health care provider tells you not to drink. If you drink alcohol: Limit how much you have to 0-2 drinks a day. Know how much alcohol is in your drink. In the [place] one drink equals one 12 oz bottle of beer (355 mL), one 5 oz glass of wine (148 mL), or one 1 oz glass of hard liquor (44 mL). Lifestyle Brush your teeth every morning and night with fluoride toothpaste. Floss one time each day. Exercise for at least 30 minutes 5 or more days each week. Do not use any products that contain nicotine or tobacco. These products include cigarettes, chewing tobacco, and vaping devices, such as e-cigarettes. If you need help quitting, ask your health care provider. Do not use drugs. If you are sexually active, practice safe sex. Use a condom or other form of protection to prevent STIs. Take aspirin  only as told by your health care provider. Make sure that you understand how much to take and what form to take. Work with your health care provider to find out whether it is safe and beneficial for you to take aspirin  daily. Ask your health care provider if you need to take a cholesterol-lowering medicine (statin). Find healthy ways to manage stress, such as: Meditation, yoga, or listening to music. Journaling. Talking to a trusted person. Spending time with friends and family. Safety Always wear your seat belt while driving or riding in a vehicle. Do not drive: If you have been drinking alcohol. Do not ride with someone who has been drinking. When you are tired or distracted. While texting. If you have been using any mind-altering substances or drugs. Wear a helmet and other  protective equipment during sports activities. If you have firearms in your house, make sure you follow all gun safety procedures. Minimize exposure to UV radiation to reduce your risk of skin cancer. What's next? Visit your health care provider once a year for an annual wellness visit. Ask your health care provider how often you should have your eyes and teeth checked. Stay up to date on all vaccines. This information is not intended to replace advice given to you by your health care provider. Make sure you discuss any questions you have with your health care provider. Document Revised: 07/05/2020 Document Reviewed: 07/05/2020 Elsevier Patient Education  2024 [employer].

## 2023-07-24 NOTE — Assessment & Plan Note (Signed)
 At goal on recent labs on Lipitor 40 mg daily.  Do not need to recheck today.

## 2023-07-24 NOTE — Assessment & Plan Note (Signed)
 A1c 5.8 on recent labs.

## 2023-08-31 ENCOUNTER — Encounter: Payer: Self-pay | Admitting: Family Medicine

## 2023-09-01 ENCOUNTER — Ambulatory Visit (INDEPENDENT_AMBULATORY_CARE_PROVIDER_SITE_OTHER): Admitting: Family Medicine

## 2023-09-01 ENCOUNTER — Encounter: Payer: Self-pay | Admitting: Family Medicine

## 2023-09-01 VITALS — BP 160/84 | HR 44 | Temp 98.8°F | Ht 70.5 in | Wt 179.8 lb

## 2023-09-01 DIAGNOSIS — I10 Essential (primary) hypertension: Secondary | ICD-10-CM

## 2023-09-01 DIAGNOSIS — R1031 Right lower quadrant pain: Secondary | ICD-10-CM

## 2023-09-01 DIAGNOSIS — R39198 Other difficulties with micturition: Secondary | ICD-10-CM

## 2023-09-01 DIAGNOSIS — R1011 Right upper quadrant pain: Secondary | ICD-10-CM

## 2023-09-01 DIAGNOSIS — R351 Nocturia: Secondary | ICD-10-CM

## 2023-09-01 LAB — POC URINALSYSI DIPSTICK (AUTOMATED)
Bilirubin, UA: NEGATIVE
Blood, UA: POSITIVE
Glucose, UA: NEGATIVE
Ketones, UA: NEGATIVE
Leukocytes, UA: NEGATIVE
Nitrite, UA: NEGATIVE
Protein, UA: NEGATIVE
Spec Grav, UA: 1.025 (ref 1.010–1.025)
Urobilinogen, UA: 0.2 U/dL
pH, UA: 5.5 (ref 5.0–8.0)

## 2023-09-01 MED ORDER — TAMSULOSIN HCL 0.4 MG PO CAPS: 0.4000 mg | ORAL_CAPSULE | Freq: Every day | ORAL | 3 refills | Status: DC

## 2023-09-01 NOTE — Progress Notes (Signed)
 Established Patient Office Visit   Subjective  Patient ID: Dustin Burton, male    DOB: 09-14-1950  Age: 73 y.o. MRN: [id number]  Chief Complaint  Patient presents with   Acute Visit    UTI symptoms, abdominal pain or discomfort, urine flow, emptying, Started  in July,     Patient is a 73 year old male followed by Dr. Kennyth and seen for acute concern. OTC urine test suggested possible infection due to presence of nitrites. Pt endorses difficulty emptying bladder, decreased urine flow, abdominal pain/achy sensation x 1 month.  Also with nocturia.  Typically has no issues with urinary flow due to h/o prostate cancer s/p prostatectomy.  Denies fever, chills, nausea, vomiting.  Does recall having an episode of left-sided back pain last month.  Attributed it to sore muscles due to increased physical activity.  Pt noticed something in toilet after urination.  Unable to discern color due to history of colorblindness for red, green, and blue.  Denies history of renal calculi.  Drinking less water.  Has gallbladder in place and may have noticed discomfort after eating    Patient Active Problem List   Diagnosis Date Noted   GERD (gastroesophageal reflux disease) 01/02/2023   Hyperglycemia 10/30/2018   Hypertension 12/01/2017   Chronic seasonal allergic rhinitis due to pollen 02/12/2016   History of prostate cancer s/p prostatectomy 2013 01/03/2014   Polyneuropathy 02/17/2013   Positive ANA (antinuclear antibody) 01/28/2013   Lymphocytic colitis 03/31/2012   Hyperlipidemia with target LDL less than 70 04/23/2007   Past Medical History:  Diagnosis Date   Brachial plexitis    Elevated LFTs 07/21/2001   Hemorrhoids    Hypercholesteremia    Hypertension    IBS (irritable bowel syndrome)    Prostate cancer (HCC)    Seasonal allergies    Past Surgical History:  Procedure Laterality Date   HAND SURGERY Left    index finger   INGUINAL HERNIA REPAIR Right 08/21/2022   KNEE SURGERY Right     right   PROSTATECTOMY     Social History   Tobacco Use   Smoking status: Never   Smokeless tobacco: Never  Vaping Use   Vaping status: Never Used  Substance Use Topics   Alcohol use: Yes    Comment: socially   Drug use: No   Family History  Problem Relation Age of Onset   Hypertension Mother    Coronary artery disease Mother        PCI   Crohn's disease Mother    Hyperlipidemia Mother    Hypertension Father        carotid endarterectomy   Colon cancer Father    Crohn's disease Father    Hyperlipidemia Father    Alzheimer's disease Father    Cystic fibrosis Other        niece   Rectal cancer Neg Hx    Stomach cancer Neg Hx    Esophageal cancer Neg Hx    Allergies  Allergen Reactions   Tadalafil Other (See Comments)    Muscular leg pain    ROS Negative unless stated above    Objective:     BP (!) 160/84 (BP Location: Left Arm, Patient Position: Sitting, Cuff Size: Normal)   Pulse (!) 44   Temp 98.8 F (37.1 C) (Oral)   Ht 5' 10.5 (1.791 m)   Wt 179 lb 12.8 oz (81.6 kg)   SpO2 98%   BMI 25.43 kg/m  BP Readings from Last 3 Encounters:  09/01/23 (!) 160/84  07/24/23 138/74  07/17/23 139/80   Wt Readings from Last 3 Encounters:  09/01/23 179 lb 12.8 oz (81.6 kg)  07/24/23 175 lb 12.8 oz (79.7 kg)  07/17/23 170 lb (77.1 kg)      Physical Exam Constitutional:      General: He is not in acute distress.    Appearance: Normal appearance.  HENT:     Head: Normocephalic and atraumatic.     Nose: Nose normal.     Mouth/Throat:     Mouth: Mucous membranes are moist.  Cardiovascular:     Rate and Rhythm: Normal rate and regular rhythm.     Heart sounds: Normal heart sounds. No murmur heard.    No gallop.  Pulmonary:     Effort: Pulmonary effort is normal. No respiratory distress.     Breath sounds: Normal breath sounds. No wheezing, rhonchi or rales.  Abdominal:     General: Bowel sounds are normal.     Palpations: Abdomen is soft.      Tenderness: There is abdominal tenderness in the right upper quadrant and right lower quadrant. There is no right CVA tenderness.     Comments: Mild L CVA discomfort with tapping.  Skin:    General: Skin is warm and dry.  Neurological:     Mental Status: He is alert and oriented to person, place, and time.        07/24/2023   10:29 AM 01/02/2023    8:18 AM 05/22/2020    9:17 AM  Depression screen PHQ 2/9  Decreased Interest 0 0 0  Down, Depressed, Hopeless 0 0 0  PHQ - 2 Score 0 0 0       No data to display           Results for orders placed or performed in visit on 09/01/23  POCT Urinalysis Dipstick (Automated)  Result Value Ref Range   Color, UA yellow    Clarity, UA clear    Glucose, UA Negative Negative   Bilirubin, UA neg    Ketones, UA neg    Spec Grav, UA 1.025 1.010 - 1.025   Blood, UA pos    pH, UA 5.5 5.0 - 8.0   Protein, UA Negative Negative   Urobilinogen, UA 0.2 0.2 or 1.0 E.U./dL   Nitrite, UA neg    Leukocytes, UA Negative Negative      Assessment & Plan:   Decreased urine stream -     POCT Urinalysis Dipstick (Automated) -     Tamsulosin  HCl; Take 1 capsule (0.4 mg total) by mouth daily.  Dispense: 30 capsule; Refill: 3 -     CT ABDOMEN PELVIS W WO CONTRAST; Future  Nocturia -     POCT Urinalysis Dipstick (Automated) -     Urine Culture -     Urine Microscopic  Right upper quadrant abdominal pain -     Comprehensive metabolic panel with GFR; Future -     CBC with Differential/Platelet; Future -     Lipase -     CT ABDOMEN PELVIS W WO CONTRAST; Future  Right lower quadrant abdominal pain -     Comprehensive metabolic panel with GFR; Future -     CBC with Differential/Platelet; Future -     CT ABDOMEN PELVIS W WO CONTRAST; Future  Essential hypertension  Urinary concerns x 1 month.  POC UA with RBCs and SG 1.025.  Otherwise negative.  Concern for renal calculi given symptoms.  Start Flomax .  Obtain UCX and urine micro.  Obtain labs,  unfortunately clinic lab was closed at time of patient's visit.  Patient will return in the morning for labs.  Order placed for CT abdomen and pelvis given RUQ and RLQ abdominal pain on exam. Concern for renal calculi and possible gallbladder dz.  Given strict precautions.  Return if symptoms worsen or fail to improve.   Clotilda JONELLE Single, MD

## 2023-09-01 NOTE — Telephone Encounter (Signed)
 Please schedule an office visit with PCP for possible UTI

## 2023-09-02 ENCOUNTER — Telehealth: Payer: Self-pay | Admitting: *Deleted

## 2023-09-02 ENCOUNTER — Other Ambulatory Visit (INDEPENDENT_AMBULATORY_CARE_PROVIDER_SITE_OTHER)

## 2023-09-02 ENCOUNTER — Other Ambulatory Visit: Payer: Self-pay

## 2023-09-02 DIAGNOSIS — R1031 Right lower quadrant pain: Secondary | ICD-10-CM

## 2023-09-02 DIAGNOSIS — R39198 Other difficulties with micturition: Secondary | ICD-10-CM

## 2023-09-02 DIAGNOSIS — R1011 Right upper quadrant pain: Secondary | ICD-10-CM

## 2023-09-02 DIAGNOSIS — R351 Nocturia: Secondary | ICD-10-CM | POA: Diagnosis not present

## 2023-09-02 LAB — COMPREHENSIVE METABOLIC PANEL WITH GFR
ALT: 30 U/L (ref 0–53)
AST: 36 U/L (ref 0–37)
Albumin: 4.3 g/dL (ref 3.5–5.2)
Alkaline Phosphatase: 59 U/L (ref 39–117)
BUN: 22 mg/dL (ref 6–23)
CO2: 29 meq/L (ref 19–32)
Calcium: 9.4 mg/dL (ref 8.4–10.5)
Chloride: 103 meq/L (ref 96–112)
Creatinine, Ser: 1.33 mg/dL (ref 0.40–1.50)
GFR: 53.04 mL/min — ABNORMAL LOW (ref >60.00)
Glucose, Bld: 86 mg/dL (ref 70–99)
Potassium: 4 meq/L (ref 3.5–5.1)
Sodium: 140 meq/L (ref 135–145)
Total Bilirubin: 0.6 mg/dL (ref 0.2–1.2)
Total Protein: 7.3 g/dL (ref 6.0–8.3)

## 2023-09-02 LAB — CBC WITH DIFFERENTIAL/PLATELET
Basophils Absolute: 0 K/uL (ref 0.0–0.1)
Basophils Relative: 0.5 % (ref 0.0–3.0)
Eosinophils Absolute: 0.1 K/uL (ref 0.0–0.7)
Eosinophils Relative: 1 % (ref 0.0–5.0)
HCT: 40.3 % (ref 39.0–52.0)
Hemoglobin: 13.5 g/dL (ref 13.0–17.0)
Lymphocytes Relative: 14.5 % (ref 12.0–46.0)
Lymphs Abs: 1.1 K/uL (ref 0.7–4.0)
MCHC: 33.5 g/dL (ref 30.0–36.0)
MCV: 94.1 fl (ref 78.0–100.0)
Monocytes Absolute: 0.8 K/uL (ref 0.1–1.0)
Monocytes Relative: 11.2 % (ref 3.0–12.0)
Neutro Abs: 5.5 K/uL (ref 1.4–7.7)
Neutrophils Relative %: 72.8 % (ref 43.0–77.0)
Platelets: 281 K/uL (ref 150.0–400.0)
RBC: 4.28 Mil/uL (ref 4.22–5.81)
RDW: 13.7 % (ref 11.5–15.5)
WBC: 7.5 K/uL (ref 4.0–10.5)

## 2023-09-02 LAB — URINALYSIS, MICROSCOPIC ONLY: RBC / HPF: NONE SEEN (ref 0–?)

## 2023-09-02 NOTE — Telephone Encounter (Signed)
 Copied from CRM [id number]. Topic: Clinical - Request for Lab/Test Order >> Sep 02, 2023  8:36 AM Chasity T wrote: Reason for CRM: Pansy from [facility] in requesting a call back from nurse regarding questions on imagining. Call back number [phone] ext [phone]

## 2023-09-02 NOTE — Progress Notes (Signed)
 Spoke with imaging and CT would need to be changed to Without contrast. Order has been replaced, they are aware Dr. Mercer is not in the office till 8/13 to sing off on change

## 2023-09-02 NOTE — Telephone Encounter (Signed)
 Called [facility] stated, order been change

## 2023-09-02 NOTE — Telephone Encounter (Signed)
 Noted

## 2023-09-03 DIAGNOSIS — H903 Sensorineural hearing loss, bilateral: Secondary | ICD-10-CM | POA: Diagnosis not present

## 2023-09-03 LAB — URINE CULTURE
MICRO NUMBER:: 16819907
Result:: NO GROWTH
SPECIMEN QUALITY:: ADEQUATE

## 2023-09-05 ENCOUNTER — Ambulatory Visit: Payer: Self-pay | Admitting: Family Medicine

## 2023-09-08 DIAGNOSIS — H40013 Open angle with borderline findings, low risk, bilateral: Secondary | ICD-10-CM | POA: Diagnosis not present

## 2023-09-10 ENCOUNTER — Encounter: Payer: Self-pay | Admitting: Family Medicine

## 2023-09-11 ENCOUNTER — Inpatient Hospital Stay
Admission: RE | Admit: 2023-09-11 | Discharge: 2023-09-11 | Discharge status: Home / Self Care | Source: Ambulatory Visit | Attending: Family Medicine

## 2023-09-11 DIAGNOSIS — R39198 Other difficulties with micturition: Secondary | ICD-10-CM

## 2023-09-11 DIAGNOSIS — R1031 Right lower quadrant pain: Secondary | ICD-10-CM

## 2023-09-11 DIAGNOSIS — R1032 Left lower quadrant pain: Secondary | ICD-10-CM | POA: Diagnosis not present

## 2023-09-11 DIAGNOSIS — R1011 Right upper quadrant pain: Secondary | ICD-10-CM

## 2023-09-12 ENCOUNTER — Ambulatory Visit: Payer: Self-pay | Admitting: Family Medicine

## 2023-09-17 ENCOUNTER — Ambulatory Visit (INDEPENDENT_AMBULATORY_CARE_PROVIDER_SITE_OTHER)

## 2023-09-17 VITALS — Ht 71.0 in | Wt 175.0 lb

## 2023-09-17 DIAGNOSIS — Z0000 Encounter for general adult medical examination without abnormal findings: Secondary | ICD-10-CM | POA: Diagnosis not present

## 2023-09-17 NOTE — Progress Notes (Addendum)
 Subjective:   Dustin Burton is a 73 y.o. who presents for a Medicare Wellness preventive visit.  As a reminder, Annual Wellness Visits don't include a physical exam, and some assessments may be limited, especially if this visit is performed virtually. We may recommend an in-person follow-up visit with your provider if needed.  Visit Complete: Virtual I connected with  Dustin Burton on 09/17/23 by a audio enabled telemedicine application and verified that I am speaking with the correct person using two identifiers.  Patient Location: Home  Provider Location: Home Office  I discussed the limitations of evaluation and management by telemedicine. The patient expressed understanding and agreed to proceed.  Vital Signs: Because this visit was a virtual/telehealth visit, some criteria may be missing or patient reported. Any vitals not documented were not able to be obtained and vitals that have been documented are patient reported.  VideoDeclined- This patient declined Librarian, academic. Therefore the visit was completed with audio only.  Persons Participating in Visit: Patient.  AWV Questionnaire: No: Patient Medicare AWV questionnaire was not completed prior to this visit.  Cardiac Risk Factors include: advanced age (>6men, >23 women);hypertension;dyslipidemia;male gender     Objective:    Today's Vitals   09/17/23 1340  Weight: 175 lb (79.4 kg)  Height: 5' 11 (1.803 m)   Body mass index is 24.41 kg/m.     09/17/2023    1:46 PM 09/17/2023    1:43 PM 06/29/2019   10:46 AM 07/27/2018   10:06 AM 02/18/2016   10:08 AM 04/29/2014   11:36 AM 01/06/2014    2:06 PM  Advanced Directives  Does Patient Have a Medical Advance Directive? Yes Yes No No Yes  Yes  No   Type of Estate agent of [place];Living will Healthcare Power of [place];Living will   Healthcare Power of [place];Living will Living will    Copy of Healthcare Power of Attorney  in Chart? No - copy requested No - copy requested   Yes  No - copy requested    Would patient like information on creating a medical advance directive?   No - Patient declined No - Patient declined    Yes - Educational materials given      Data saved with a previous flowsheet row definition    Current Medications (verified) Outpatient Encounter Medications as of 09/17/2023  Medication Sig   amLODipine  (NORVASC ) 5 MG tablet Take 1 tablet (5 mg total) by mouth daily.   atorvastatin  (LIPITOR) 40 MG tablet TAKE 1 TABLET BY MOUTH EVERY DAY   ezetimibe  (ZETIA ) 10 MG tablet TAKE 0.5 TABLETS (5 MG TOTAL) BY MOUTH DAILY.   tamsulosin  (FLOMAX ) 0.4 MG CAPS capsule Take 1 capsule (0.4 mg total) by mouth daily. (Patient not taking: Reported on 09/17/2023)   No facility-administered encounter medications on file as of 09/17/2023.    Allergies (verified) Tadalafil   History: Past Medical History:  Diagnosis Date   Brachial plexitis    Elevated LFTs 07/21/2001   Hemorrhoids    Hypercholesteremia    Hypertension    IBS (irritable bowel syndrome)    Prostate cancer (HCC)    Seasonal allergies    Past Surgical History:  Procedure Laterality Date   HAND SURGERY Left    index finger   INGUINAL HERNIA REPAIR Right 08/21/2022   KNEE SURGERY Right    right   PROSTATECTOMY     Family History  Problem Relation Age of Onset   Hypertension Mother  Coronary artery disease Mother        PCI   Crohn's disease Mother    Hyperlipidemia Mother    Hypertension Father        carotid endarterectomy   Colon cancer Father    Crohn's disease Father    Hyperlipidemia Father    Alzheimer's disease Father    Cystic fibrosis Other        niece   Rectal cancer Neg Hx    Stomach cancer Neg Hx    Esophageal cancer Neg Hx    Social History   Socioeconomic History   Marital status: Married    Spouse name: Not on file   Number of children: Not on file   Years of education: Not on file   Highest  education level: Not on file  Occupational History   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    Employer: RETIRED    Comment: Retired  Tobacco Use   Smoking status: Never   Smokeless tobacco: Never  Vaping Use   Vaping status: Never Used  Substance and Sexual Activity   Alcohol use: Yes    Comment: socially   Drug use: No   Sexual activity: Not on file  Other Topics Concern   Not on file  Social History Narrative   Not on file   Social Drivers of Health   Financial Resource Strain: Low Risk  (09/17/2023)   Overall Financial Resource Strain (CARDIA)    Difficulty of Paying Living Expenses: Not hard at all  Food Insecurity: No Food Insecurity (09/17/2023)   Hunger Vital Sign    Worried About Running Out of Food in the Last Year: Never true    Ran Out of Food in the Last Year: Never true  Transportation Needs: No Transportation Needs (09/17/2023)   PRAPARE - Administrator, Civil Service (Medical): No    Lack of Transportation (Non-Medical): No  Physical Activity: Sufficiently Active (09/17/2023)   Exercise Vital Sign    Days of Exercise per Week: 5 days    Minutes of Exercise per Session: 120 min  Stress: No Stress Concern Present (09/17/2023)   [employer] of Occupational Health - Occupational Stress Questionnaire    Feeling of Stress: Not at all  Social Connections: Moderately Integrated (09/17/2023)   Social Connection and Isolation Panel    Frequency of Communication with Friends and Family: More than three times a week    Frequency of Social Gatherings with Friends and Family: More than three times a week    Attends Religious Services: Never    Database administrator or Organizations: Yes    Attends Banker Meetings: 1 to 4 times per year    Marital Status: Married    Tobacco Counseling Counseling given: Not Answered    Clinical Intake:  Pre-visit preparation completed: Yes  Pain : No/denies pain     BMI - recorded:  24.41 Nutritional Status: BMI of 19-24  Normal Diabetes: No  Lab Results  Component Value Date   HGBA1C 5.8 01/02/2023     How often do you need to have someone help you when you read instructions, pamphlets, or other written materials from your doctor or pharmacy?: 1 - Never  Interpreter Needed?: No  Information entered by :: Ellouise Haws, LPN   Activities of Daily Living     09/17/2023    1:41 PM  In your present state of health, do you have any difficulty performing the following activities:  Hearing? 1  Comment hearing aids  Vision? 0  Difficulty concentrating or making decisions? 0  Walking or climbing stairs? 0  Dressing or bathing? 0  Doing errands, shopping? 0  Preparing Food and eating ? N  Using the Toilet? N  In the past six months, have you accidently leaked urine? N  Do you have problems with loss of bowel control? N  Managing your Medications? N  Managing your Finances? N  Housekeeping or managing your Housekeeping? N    Patient Care Team: Kennyth Worth HERO, MD as PCP - General (Family Medicine) Okey Vina GAILS, MD as PCP - Cardiology (Cardiology)  I have updated your Care Teams any recent Medical Services you may have received from other providers in the past year.     Assessment:   This is a routine wellness examination for [place].  Hearing/Vision screen Hearing Screening - Comments:: Pt has hearing aids  Vision Screening - Comments:: Wears rx glasses - up to date with routine eye exams with Dr Maude Bring   Goals Addressed             This Visit's Progress    Patient Stated       Weight loss        Depression Screen     09/17/2023    1:43 PM 07/24/2023   10:29 AM 01/02/2023    8:18 AM 05/22/2020    9:17 AM 10/26/2018    2:19 PM 03/23/2017   12:52 PM 03/19/2017    4:18 PM  PHQ 2/9 Scores  PHQ - 2 Score 0 0 0 0 0 0 1  PHQ- 9 Score     1      Fall Risk     09/17/2023    1:45 PM 07/24/2023   10:29 AM 01/02/2023    8:18 AM 05/22/2020    9:17  AM 10/26/2018    2:20 PM  Fall Risk   Falls in the past year? 0 0 0 0 0   Number falls in past yr: 0 0 0    Injury with Fall? 0 0 0    Risk for fall due to : No Fall Risks No Fall Risks No Fall Risks    Follow up Falls prevention discussed Falls evaluation completed        Data saved with a previous flowsheet row definition    MEDICARE RISK AT HOME:  Medicare Risk at Home Any stairs in or around the home?: Yes If so, are there any without handrails?: No Home free of loose throw rugs in walkways, pet beds, electrical cords, etc?: Yes Adequate lighting in your home to reduce risk of falls?: Yes Life alert?: No Use of a cane, walker or w/c?: No Grab bars in the bathroom?: No Shower chair or bench in shower?: No Elevated toilet seat or a handicapped toilet?: No  TIMED UP AND GO:  Was the test performed?  No  Cognitive Function: 6CIT completed        09/17/2023    1:46 PM  6CIT Screen  What Year? 0 points  What month? 0 points  What time? 0 points  Count back from 20 0 points  Months in reverse 0 points  Repeat phrase 0 points  Total Score 0 points    Immunizations Immunization History  Administered Date(s) Administered   Fluad Quad(high Dose 65+) 10/26/2018, 11/25/2020   INFLUENZA, HIGH DOSE SEASONAL PF 11/11/2015, 10/31/2016, 10/02/2017   Influenza Split 05/29/2006, 11/05/2010, 11/12/2011   Influenza  Whole 11/12/2006, 10/14/2007, 11/29/2008, 11/02/2009   Influenza,inj,Quad PF,6+ Mos 10/06/2017   Influenza-Unspecified 09/22/2014, 11/09/2021, 10/26/2022   PNEUMOCOCCAL CONJUGATE-20 01/02/2023   PPD Test 01/26/2013   Pfizer Covid-19 Vaccine Bivalent Booster 93yrs & up 02/15/2021   Pneumococcal Conjugate-13 02/09/2015   Pneumococcal Polysaccharide-23 09/03/2013   Td 10/22/2002   Tdap 09/03/2013, 07/27/2018   Unspecified SARS-COV-2 Vaccination 10/26/2022   Zoster Recombinant(Shingrix) 07/18/2016, 09/24/2016   Zoster, Live 08/28/2012    Screening Tests Health  Maintenance  Topic Date Due   COVID-19 Vaccine (3 - 2024-25 season) 04/26/2023   INFLUENZA VACCINE  08/22/2023   Medicare Annual Wellness (AWV)  09/16/2024   Colonoscopy  07/16/2028   DTaP/Tdap/Td (4 - Td or Tdap) 07/26/2028   Pneumococcal Vaccine: 50+ Years  Completed   Hepatitis C Screening  Completed   Zoster Vaccines- Shingrix  Completed   HPV VACCINES  Aged Out   Meningococcal B Vaccine  Aged Out    Health Maintenance  Health Maintenance Due  Topic Date Due   COVID-19 Vaccine (3 - 2024-25 season) 04/26/2023   INFLUENZA VACCINE  08/22/2023   Health Maintenance Items Addressed: See Nurse Notes at the end of this note  Additional Screening:  Vision Screening: Recommended annual ophthalmology exams for early detection of glaucoma and other disorders of the eye. Would you like a referral to an eye doctor? No    Dental Screening: Recommended annual dental exams for proper oral hygiene  Community Resource Referral / Chronic Care Management: CRR required this visit?  No   CCM required this visit?  No   Plan:    I have personally reviewed and noted the following in the patient's chart:   Medical and social history Use of alcohol, tobacco or illicit drugs  Current medications and supplements including opioid prescriptions. Patient is not currently taking opioid prescriptions. Functional ability and status Nutritional status Physical activity Advanced directives List of other physicians Hospitalizations, surgeries, and ER visits in previous 12 months Vitals Screenings to include cognitive, depression, and falls Referrals and appointments  In addition, I have reviewed and discussed with patient certain preventive protocols, quality metrics, and best practice recommendations. A written personalized care plan for preventive services as well as general preventive health recommendations were provided to patient.   Ellouise VEAR Haws, LPN   1/72/7974   After Visit  Summary: (MyChart) Due to this being a telephonic visit, the after visit summary with patients personalized plan was offered to patient via MyChart   Notes: Nothing significant to report at this time.

## 2023-09-17 NOTE — Patient Instructions (Signed)
 Dustin Burton , Thank you for taking time out of your busy schedule to complete your Annual Wellness Visit with me. I enjoyed our conversation and look forward to speaking with you again next year. I, as well as your care team,  appreciate your ongoing commitment to your health goals. Please review the following plan we discussed and let me know if I can assist you in the future. Your Game plan/ To Do List    Referrals: If you haven't heard from the office you've been referred to, please reach out to them at the phone provided.   Follow up Visits: We will see or speak with you next year for your Next Medicare AWV with our clinical staff Have you seen your provider in the last 6 months (3 months if uncontrolled diabetes)? Yes  Clinician Recommendations:  Aim for 30 minutes of exercise or brisk walking, 6-8 glasses of water, and 5 servings of fruits and vegetables each day.       This is a list of the screenings recommended for you:  Health Maintenance  Topic Date Due   Medicare Annual Wellness Visit  05/22/2021   COVID-19 Vaccine (3 - 2024-25 season) 04/26/2023   Flu Shot  08/22/2023   Colon Cancer Screening  07/16/2028   DTaP/Tdap/Td vaccine (4 - Td or Tdap) 07/26/2028   Pneumococcal Vaccine for age over 83  Completed   Hepatitis C Screening  Completed   Zoster (Shingles) Vaccine  Completed   HPV Vaccine  Aged Out   Meningitis B Vaccine  Aged Out    Advanced directives: (Copy Requested) Please bring a copy of your health care power of attorney and living will to the office to be added to your chart at your convenience. You can mail to [facility] [address] [place], [place] [address] or email to [email or web address] Advance Care Planning is important because it:  [x]  Makes sure you receive the medical care that is consistent with your values, goals, and preferences  [x]  It provides guidance to your family and loved ones and reduces their decisional burden  about whether or not they are making the right decisions based on your wishes.  Follow the link provided in your after visit summary or read over the paperwork we have mailed to you to help you started getting your Advance Directives in place. If you need assistance in completing these, please reach out to us  so that we can help you!  See attachments for Preventive Care and Fall Prevention Tips.

## 2023-09-19 ENCOUNTER — Encounter: Payer: Self-pay | Admitting: Family Medicine

## 2023-09-19 DIAGNOSIS — R3915 Urgency of urination: Secondary | ICD-10-CM | POA: Diagnosis not present

## 2023-09-19 DIAGNOSIS — C61 Malignant neoplasm of prostate: Secondary | ICD-10-CM | POA: Diagnosis not present

## 2023-09-19 DIAGNOSIS — N210 Calculus in bladder: Secondary | ICD-10-CM | POA: Diagnosis not present

## 2023-09-19 DIAGNOSIS — R3916 Straining to void: Secondary | ICD-10-CM | POA: Diagnosis not present

## 2023-09-19 DIAGNOSIS — R350 Frequency of micturition: Secondary | ICD-10-CM | POA: Diagnosis not present

## 2023-09-19 DIAGNOSIS — R351 Nocturia: Secondary | ICD-10-CM | POA: Diagnosis not present

## 2023-09-19 DIAGNOSIS — R3912 Poor urinary stream: Secondary | ICD-10-CM | POA: Diagnosis not present

## 2023-09-19 NOTE — Telephone Encounter (Signed)
 Please schedule an appt with any open provider

## 2023-09-23 ENCOUNTER — Ambulatory Visit: Admitting: Family Medicine

## 2023-10-09 ENCOUNTER — Encounter: Payer: Self-pay | Admitting: Family Medicine

## 2023-10-10 ENCOUNTER — Telehealth: Payer: Self-pay | Admitting: Family Medicine

## 2023-10-10 NOTE — Telephone Encounter (Unsigned)
 Copied from CRM [id number]. Topic: Clinical - Medication Refill >> Oct 10, 2023  4:01 PM Franky GRADE wrote: Medication: albuterol  (PROAIR  HFA) 108 (90 BASE) MCG/ACT inhaler [[id number]]  Has the patient contacted their pharmacy? No, surgeon is requesting medication for the patient due to a bladder stone surgery on 10/15/2023 (Agent: If no, request that the patient contact the pharmacy for the refill. If patient does not wish to contact the pharmacy document the reason why and proceed with request.) (Agent: If yes, when and what did the pharmacy advise?)  This is the patient's preferred pharmacy:  [id number] [address], [place] - [address] [place] [address] Phone: [phone] Fax: [phone]    Is this the correct pharmacy for this prescription? Yes If no, delete pharmacy and type the correct one.   Has the prescription been filled recently? No  Is the patient out of the medication? Yes  Has the patient been seen for an appointment in the last year OR does the patient have an upcoming appointment? Yes  Can we respond through MyChart? Yes  Agent: Please be advised that Rx refills may take up to 3 business days. We ask that you follow-up with your pharmacy.

## 2023-10-13 NOTE — Telephone Encounter (Signed)
 Ok to send in albuterol  inhaler.  Worth HERO. Kennyth, MD 10/13/2023 9:08 AM

## 2023-10-13 NOTE — Telephone Encounter (Signed)
**Note De-identified  [place] Obfuscation** Please advise 

## 2023-10-14 ENCOUNTER — Other Ambulatory Visit: Payer: Self-pay | Admitting: *Deleted

## 2023-10-14 DIAGNOSIS — R61 Generalized hyperhidrosis: Secondary | ICD-10-CM

## 2023-10-14 DIAGNOSIS — Z111 Encounter for screening for respiratory tuberculosis: Secondary | ICD-10-CM

## 2023-10-14 DIAGNOSIS — Z1159 Encounter for screening for other viral diseases: Secondary | ICD-10-CM

## 2023-10-14 MED ORDER — ALBUTEROL SULFATE HFA 108 (90 BASE) MCG/ACT IN AERS: INHALATION_SPRAY | RESPIRATORY_TRACT | 5 refills | Status: AC

## 2023-10-15 DIAGNOSIS — N210 Calculus in bladder: Secondary | ICD-10-CM | POA: Diagnosis not present

## 2023-10-15 NOTE — Telephone Encounter (Signed)
 Duplicated message.

## 2023-11-17 ENCOUNTER — Other Ambulatory Visit (HOSPITAL_BASED_OUTPATIENT_CLINIC_OR_DEPARTMENT_OTHER): Payer: Self-pay

## 2023-11-17 ENCOUNTER — Encounter: Payer: Self-pay | Admitting: Family Medicine

## 2023-11-17 DIAGNOSIS — E785 Hyperlipidemia, unspecified: Secondary | ICD-10-CM

## 2023-11-17 MED ORDER — EZETIMIBE 10 MG PO TABS
5.0000 mg | ORAL_TABLET | Freq: Every morning | ORAL | 3 refills | Status: AC
  Filled 2023-11-17: qty 45, 90d supply, fill #0
  Filled 2024-02-16: qty 45, 90d supply, fill #1

## 2023-11-17 MED ORDER — ATORVASTATIN CALCIUM 40 MG PO TABS
40.0000 mg | ORAL_TABLET | Freq: Every day | ORAL | 3 refills | Status: AC
  Filled 2023-11-17: qty 90, 90d supply, fill #0
  Filled 2024-02-16: qty 90, 90d supply, fill #1

## 2023-11-17 MED ORDER — AMLODIPINE BESYLATE 5 MG PO TABS
5.0000 mg | ORAL_TABLET | Freq: Every day | ORAL | 3 refills | Status: AC
  Filled 2023-11-17: qty 90, 90d supply, fill #0
  Filled 2024-02-16: qty 90, 90d supply, fill #1

## 2023-11-20 ENCOUNTER — Other Ambulatory Visit: Payer: Self-pay | Admitting: Medical Genetics

## 2023-11-20 DIAGNOSIS — Z006 Encounter for examination for normal comparison and control in clinical research program: Secondary | ICD-10-CM

## 2023-12-15 ENCOUNTER — Ambulatory Visit: Attending: Internal Medicine | Admitting: Internal Medicine

## 2023-12-15 ENCOUNTER — Ambulatory Visit

## 2023-12-15 ENCOUNTER — Other Ambulatory Visit: Payer: Self-pay | Admitting: Internal Medicine

## 2023-12-15 ENCOUNTER — Encounter: Payer: Self-pay | Admitting: Internal Medicine

## 2023-12-15 VITALS — BP 130/62 | HR 76 | Ht 71.0 in | Wt 180.4 lb

## 2023-12-15 DIAGNOSIS — I493 Ventricular premature depolarization: Secondary | ICD-10-CM

## 2023-12-15 DIAGNOSIS — E785 Hyperlipidemia, unspecified: Secondary | ICD-10-CM

## 2023-12-15 MED ORDER — ASPIRIN 81 MG PO TBEC: 81.0000 mg | DELAYED_RELEASE_TABLET | Freq: Every day | ORAL | Status: AC

## 2023-12-15 NOTE — Progress Notes (Unsigned)
 Enrolled for [employer] to mail a ZIO XT long term holter monitor to the patients address on file.

## 2023-12-15 NOTE — Patient Instructions (Signed)
 Medication Instructions:  Your physician has recommended you make the following change in your medication:  1) START taking Aspirin  81 mg once daily *If you need a refill on your cardiac medications before your next appointment, please call your pharmacy*  Lab Work: TODAY: HgbA1C, NMR, LpA, ApoB, CBC, BMET, TSH - please stop by [facility] on the 1st floor   Follow-Up: At [facility], you and your health needs are our priority.  As part of our continuing mission to provide you with exceptional heart care, our providers are all part of one team.  This team includes your primary Cardiologist (physician) and Advanced Practice Providers or APPs (Physician Assistants and Nurse Practitioners) who all work together to provide you with the care you need, when you need it.  Your next appointment:   1 year  Provider:   Vina Gull, MD

## 2023-12-15 NOTE — Progress Notes (Signed)
 Cardiology Office Note   Date:  12/15/2023   ID:  Dustin Burton, DOB [birth year], MRN [id number]  PCP:  Kennyth Worth HERO, MD  Cardiologist:   Vina Gull, MD   Pt presents for follow up of CAD     History of Present Illness: Dustin Burton is a 73 y.o. male with a history of CAD (on CT scan), HTN, HL  2015  Chest CT showed coronary calcifications  2019  I saw the pt in clinic   After that the pt was seen by B Bhagat PA He moved to [facility] in the interval but moved back to [place]   Too crowded  The pt says he is doing good from a cardiac standpoint   Breathing is good  NO CP  NO dizziness  No palpitations      Active  Works with a trainer a few times per week  [place] regularly    Had a urologic procedure recently  BP low    PVCs noted at that time       Current Meds  Medication Sig   albuterol  (PROAIR  HFA) 108 (90 Base) MCG/ACT inhaler 2 puffs 3o minutes before strenuous exercise, every 4 to 6 hours as needed for shortness of breath.   amLODipine  (NORVASC ) 5 MG tablet Take 1 tablet (5 mg total) by mouth daily.   aspirin  EC 81 MG tablet Take 1 tablet (81 mg total) by mouth daily. Swallow whole.   atorvastatin  (LIPITOR) 40 MG tablet Take 1 tablet (40 mg total) by mouth daily.   ezetimibe  (ZETIA ) 10 MG tablet Take 0.5 tablets (5 mg total) by mouth in the morning.     Allergies:   Tadalafil   Past Medical History:  Diagnosis Date   Brachial plexitis    Elevated LFTs 07/21/2001   Hemorrhoids    Hypercholesteremia    Hypertension    IBS (irritable bowel syndrome)    Prostate cancer (HCC)    Seasonal allergies     Past Surgical History:  Procedure Laterality Date   HAND SURGERY Left    index finger   INGUINAL HERNIA REPAIR Right 08/21/2022   KNEE SURGERY Right    right   PROSTATECTOMY       Social History:  The patient  reports that he has never smoked. He has never used smokeless tobacco. He reports current alcohol use. He reports that he does not use drugs.   Family  History:  The patient's family history includes Alzheimer's disease in his father; Colon cancer in his father; Coronary artery disease in his mother; Crohn's disease in his father and mother; Cystic fibrosis in an other family member; Hyperlipidemia in his father and mother; Hypertension in his father and mother.    ROS:  Please see the history of present illness. All other systems are reviewed and  Negative to the above problem except as noted.    PHYSICAL EXAM: VS:  BP 130/62   Pulse 76   Ht 5' 11 (1.803 m)   Wt 180 lb 6.4 oz (81.8 kg)   SpO2 97%   BMI 25.16 kg/m   GEN: Well nourished, well developed, in no acute distress  HEENT: normal  Neck: no JVD, carotid bruit Cardiac: RRR; no murmur Respiratory:  clear to auscultation GI: soft, nontender, no masses  No hepatomegaly  Ext   No LE edema   EKG:  EKG is ordered today.  NSR 76 bpm  PVCs (LBB morphology),  T wave inversion III,  AVF (new)  Lipid Panel    Component Value Date/Time   CHOL 143 01/02/2023 0921   TRIG 59.0 01/02/2023 0921   HDL 49.00 01/02/2023 0921   CHOLHDL 3 01/02/2023 0921   VLDL 11.8 01/02/2023 0921   LDLCALC 82 01/02/2023 0921   LDLCALC 59 10/21/2019 0900   LDLDIRECT 112 (H) 08/02/2011 1228      Wt Readings from Last 3 Encounters:  12/15/23 180 lb 6.4 oz (81.8 kg)  09/17/23 175 lb (79.4 kg)  09/01/23 179 lb 12.8 oz (81.6 kg)      ASSESSMENT AND PLAN:  1  CAD  Reviewed CT scan with pt   He denies symptoms   Follow  I would recomm ecASA 81 mg   he had been on it in the past and stopped when PCP told him to   Had tolerated  2  HL  WIll recheck lipids   LDL was 88 in Dec 2024 Continue atorvastatin  and Zetia    3  PVCs  Will set up for 48  hour holtermonitor to evaluate burden    Further testing based on test results   Tentative follow up in 1 year    Current medicines are reviewed at length with the patient today.  The patient does not have concerns regarding medicines.  Signed, Vina Gull,  MD  12/15/2023 4:23 PM

## 2023-12-16 LAB — NMR, LIPOPROFILE
Cholesterol, Total: 121 mg/dL (ref 100–199)
HDL Particle Number: 32.8 umol/L (ref >=30.5)
HDL-C: 52 mg/dL (ref >39)
LDL Particle Number: 524 nmol/L (ref <1000)
LDL Size: 20.1 nm — AB (ref >20.5)
LDL-C (NIH Calc): 57 mg/dL (ref 0–99)
LP-IR Score: 31 (ref <=45)
Small LDL Particle Number: 308 nmol/L (ref <=527)
Triglycerides: 55 mg/dL (ref 0–149)

## 2023-12-16 LAB — BASIC METABOLIC PANEL WITH GFR
BUN/Creatinine Ratio: 21 (ref 10–24)
BUN: 22 mg/dL (ref 8–27)
CO2: 21 mmol/L (ref 20–29)
Calcium: 9.4 mg/dL (ref 8.6–10.2)
Chloride: 106 mmol/L (ref 96–106)
Creatinine, Ser: 1.03 mg/dL (ref 0.76–1.27)
Glucose: 90 mg/dL (ref 70–99)
Potassium: 4.6 mmol/L (ref 3.5–5.2)
Sodium: 142 mmol/L (ref 134–144)
eGFR: 77 mL/min/1.73 (ref >59)

## 2023-12-16 LAB — TSH: TSH: 2.09 u[IU]/mL (ref 0.450–4.500)

## 2023-12-16 LAB — LIPOPROTEIN A (LPA): Lipoprotein (a): 18.5 nmol/L (ref <75.0)

## 2023-12-16 LAB — CBC
Hematocrit: 41.7 % (ref 37.5–51.0)
Hemoglobin: 14.2 g/dL (ref 13.0–17.7)
MCH: 33.6 pg — ABNORMAL HIGH (ref 26.6–33.0)
MCHC: 34.1 g/dL (ref 31.5–35.7)
MCV: 99 fL — ABNORMAL HIGH (ref 79–97)
Platelets: 372 x10E3/uL (ref 150–450)
RBC: 4.23 x10E6/uL (ref 4.14–5.80)
RDW: 13.2 % (ref 11.6–15.4)
WBC: 6.5 x10E3/uL (ref 3.4–10.8)

## 2023-12-16 LAB — HEMOGLOBIN A1C
Est. average glucose Bld gHb Est-mCnc: 114 mg/dL
Hgb A1c MFr Bld: 5.6 % (ref 4.8–5.6)

## 2023-12-16 LAB — APOLIPOPROTEIN B: Apolipoprotein B: 45 mg/dL (ref <90)

## 2023-12-22 ENCOUNTER — Ambulatory Visit: Payer: Self-pay | Admitting: Internal Medicine

## 2024-01-03 DIAGNOSIS — I493 Ventricular premature depolarization: Secondary | ICD-10-CM | POA: Diagnosis not present

## 2024-01-03 DIAGNOSIS — E785 Hyperlipidemia, unspecified: Secondary | ICD-10-CM | POA: Diagnosis not present

## 2024-01-08 DIAGNOSIS — I493 Ventricular premature depolarization: Secondary | ICD-10-CM | POA: Diagnosis not present

## 2024-01-08 DIAGNOSIS — E785 Hyperlipidemia, unspecified: Secondary | ICD-10-CM

## 2024-01-12 ENCOUNTER — Ambulatory Visit: Payer: Self-pay | Admitting: Internal Medicine

## 2024-01-12 DIAGNOSIS — I493 Ventricular premature depolarization: Secondary | ICD-10-CM

## 2024-01-14 ENCOUNTER — Other Ambulatory Visit (HOSPITAL_BASED_OUTPATIENT_CLINIC_OR_DEPARTMENT_OTHER): Payer: Self-pay

## 2024-01-14 MED ORDER — METOPROLOL SUCCINATE ER 25 MG PO TB24
25.0000 mg | ORAL_TABLET | Freq: Every day | ORAL | 3 refills | Status: AC
  Filled 2024-01-14: qty 90, 90d supply, fill #0

## 2024-01-16 ENCOUNTER — Encounter (HOSPITAL_BASED_OUTPATIENT_CLINIC_OR_DEPARTMENT_OTHER): Payer: Self-pay

## 2024-01-20 NOTE — Telephone Encounter (Signed)
 Left VM for patient  Toprol  XL is to suppress PVCs   18% is more than what heart does well with    Could cut amlodipine  in 1/2 if concerned about BP Echo as ordered Limit stimulants, caffeine

## 2024-02-17 ENCOUNTER — Other Ambulatory Visit (HOSPITAL_BASED_OUTPATIENT_CLINIC_OR_DEPARTMENT_OTHER): Payer: Self-pay

## 2024-02-17 ENCOUNTER — Ambulatory Visit (INDEPENDENT_AMBULATORY_CARE_PROVIDER_SITE_OTHER)

## 2024-02-17 DIAGNOSIS — I493 Ventricular premature depolarization: Secondary | ICD-10-CM

## 2024-02-17 LAB — ECHOCARDIOGRAM COMPLETE
Area-P 1/2: 3.42 cm2
S' Lateral: 2.52 cm

## 2024-02-18 ENCOUNTER — Ambulatory Visit: Payer: Self-pay | Admitting: Internal Medicine

## 2024-02-18 ENCOUNTER — Ambulatory Visit: Attending: Internal Medicine

## 2024-02-18 DIAGNOSIS — I493 Ventricular premature depolarization: Secondary | ICD-10-CM

## 2024-02-18 NOTE — Progress Notes (Unsigned)
 Enrolled patient for a 3 day Zio XT monitor to be mailed to patients home around 05/17/24

## 2024-02-18 NOTE — Addendum Note (Signed)
 Addended by: THEOTIS SHARLET PARAS on: 02/18/2024 04:19 PM   Modules accepted: Orders

## 2024-02-24 ENCOUNTER — Encounter: Payer: Self-pay | Admitting: Family Medicine

## 2024-02-25 ENCOUNTER — Other Ambulatory Visit: Payer: Self-pay | Admitting: *Deleted

## 2024-02-25 DIAGNOSIS — Z8546 Personal history of malignant neoplasm of prostate: Secondary | ICD-10-CM

## 2024-02-25 NOTE — Telephone Encounter (Signed)
 Left message to return call to our office at their convenience.

## 2024-02-25 NOTE — Telephone Encounter (Signed)
 Patient notified, lab appt schedule

## 2024-02-25 NOTE — Telephone Encounter (Signed)
 He can come here for PSA if he wishes.

## 2024-02-25 NOTE — Telephone Encounter (Signed)
 Please advise.

## 2024-02-26 ENCOUNTER — Other Ambulatory Visit

## 2024-09-22 ENCOUNTER — Ambulatory Visit
